# Patient Record
Sex: Female | Born: 1935 | Race: Black or African American | Hispanic: No | Marital: Single | Smoking: Former smoker
Health system: Southern US, Community
[De-identification: ages and names within clinical notes are randomized; demographics above are authoritative.]

## PROBLEM LIST (undated history)

## (undated) DIAGNOSIS — I10 Essential (primary) hypertension: Secondary | ICD-10-CM

## (undated) DIAGNOSIS — IMO0001 Reserved for inherently not codable concepts without codable children: Secondary | ICD-10-CM

## (undated) DIAGNOSIS — T7840XA Allergy, unspecified, initial encounter: Secondary | ICD-10-CM

## (undated) DIAGNOSIS — K219 Gastro-esophageal reflux disease without esophagitis: Secondary | ICD-10-CM

## (undated) DIAGNOSIS — K224 Dyskinesia of esophagus: Secondary | ICD-10-CM

## (undated) DIAGNOSIS — N281 Cyst of kidney, acquired: Secondary | ICD-10-CM

## (undated) DIAGNOSIS — K279 Peptic ulcer, site unspecified, unspecified as acute or chronic, without hemorrhage or perforation: Secondary | ICD-10-CM

## (undated) DIAGNOSIS — F329 Major depressive disorder, single episode, unspecified: Secondary | ICD-10-CM

## (undated) DIAGNOSIS — K5790 Diverticulosis of intestine, part unspecified, without perforation or abscess without bleeding: Secondary | ICD-10-CM

## (undated) DIAGNOSIS — N393 Stress incontinence (female) (male): Secondary | ICD-10-CM

## (undated) DIAGNOSIS — M1990 Unspecified osteoarthritis, unspecified site: Secondary | ICD-10-CM

## (undated) DIAGNOSIS — K5730 Diverticulosis of large intestine without perforation or abscess without bleeding: Secondary | ICD-10-CM

## (undated) DIAGNOSIS — F32A Depression, unspecified: Secondary | ICD-10-CM

## (undated) DIAGNOSIS — A048 Other specified bacterial intestinal infections: Secondary | ICD-10-CM

## (undated) DIAGNOSIS — Z5189 Encounter for other specified aftercare: Secondary | ICD-10-CM

## (undated) DIAGNOSIS — J479 Bronchiectasis, uncomplicated: Secondary | ICD-10-CM

## (undated) DIAGNOSIS — K222 Esophageal obstruction: Secondary | ICD-10-CM

## (undated) DIAGNOSIS — D649 Anemia, unspecified: Secondary | ICD-10-CM

## (undated) DIAGNOSIS — K449 Diaphragmatic hernia without obstruction or gangrene: Secondary | ICD-10-CM

## (undated) DIAGNOSIS — E119 Type 2 diabetes mellitus without complications: Secondary | ICD-10-CM

## (undated) DIAGNOSIS — F419 Anxiety disorder, unspecified: Secondary | ICD-10-CM

## (undated) HISTORY — PX: OTHER SURGICAL HISTORY: SHX169

## (undated) HISTORY — DX: Peptic ulcer, site unspecified, unspecified as acute or chronic, without hemorrhage or perforation: K27.9

## (undated) HISTORY — DX: Depression, unspecified: F32.A

## (undated) HISTORY — DX: Esophageal obstruction: K22.2

## (undated) HISTORY — DX: Other specified bacterial intestinal infections: A04.8

## (undated) HISTORY — PX: ROTATOR CUFF REPAIR: SHX139

## (undated) HISTORY — DX: Cyst of kidney, acquired: N28.1

## (undated) HISTORY — DX: Diverticulosis of large intestine without perforation or abscess without bleeding: K57.30

## (undated) HISTORY — DX: Unspecified osteoarthritis, unspecified site: M19.90

## (undated) HISTORY — PX: APPENDECTOMY: SHX54

## (undated) HISTORY — PX: OOPHORECTOMY: SHX86

## (undated) HISTORY — DX: Dyskinesia of esophagus: K22.4

## (undated) HISTORY — DX: Anxiety disorder, unspecified: F41.9

## (undated) HISTORY — DX: Diaphragmatic hernia without obstruction or gangrene: K44.9

## (undated) HISTORY — PX: ABDOMINAL HYSTERECTOMY: SHX81

## (undated) HISTORY — PX: COLONOSCOPY: SHX174

## (undated) HISTORY — DX: Diverticulosis of intestine, part unspecified, without perforation or abscess without bleeding: K57.90

## (undated) HISTORY — DX: Encounter for other specified aftercare: Z51.89

## (undated) HISTORY — PX: CATARACT EXTRACTION, BILATERAL: SHX1313

## (undated) HISTORY — DX: Anemia, unspecified: D64.9

## (undated) HISTORY — DX: Essential (primary) hypertension: I10

## (undated) HISTORY — DX: Bronchiectasis, uncomplicated: J47.9

## (undated) HISTORY — DX: Allergy, unspecified, initial encounter: T78.40XA

## (undated) HISTORY — DX: Reserved for inherently not codable concepts without codable children: IMO0001

## (undated) HISTORY — DX: Major depressive disorder, single episode, unspecified: F32.9

## (undated) HISTORY — DX: Gastro-esophageal reflux disease without esophagitis: K21.9

## (undated) HISTORY — DX: Type 2 diabetes mellitus without complications: E11.9

## (undated) HISTORY — PX: UPPER GASTROINTESTINAL ENDOSCOPY: SHX188

## (undated) SURGERY — Surgical Case
Anesthesia: *Unknown

---

## 1998-06-14 ENCOUNTER — Emergency Department (HOSPITAL_COMMUNITY): Admission: EM | Admit: 1998-06-14 | Discharge: 1998-06-14 | Payer: Self-pay | Admitting: Emergency Medicine

## 1999-01-25 ENCOUNTER — Ambulatory Visit (HOSPITAL_COMMUNITY): Admission: RE | Admit: 1999-01-25 | Discharge: 1999-01-25 | Payer: Self-pay | Admitting: Internal Medicine

## 1999-10-29 ENCOUNTER — Encounter: Admission: RE | Admit: 1999-10-29 | Discharge: 1999-10-29 | Payer: Self-pay | Admitting: Internal Medicine

## 1999-10-29 ENCOUNTER — Encounter: Payer: Self-pay | Admitting: Internal Medicine

## 2000-11-07 ENCOUNTER — Encounter: Admission: RE | Admit: 2000-11-07 | Discharge: 2000-11-07 | Payer: Self-pay | Admitting: Internal Medicine

## 2000-11-07 ENCOUNTER — Encounter: Payer: Self-pay | Admitting: Internal Medicine

## 2001-04-14 ENCOUNTER — Ambulatory Visit (HOSPITAL_COMMUNITY): Admission: RE | Admit: 2001-04-14 | Discharge: 2001-04-14 | Payer: Self-pay | Admitting: Pulmonary Disease

## 2001-04-14 ENCOUNTER — Encounter: Payer: Self-pay | Admitting: Pulmonary Disease

## 2001-04-21 ENCOUNTER — Ambulatory Visit: Admission: RE | Admit: 2001-04-21 | Discharge: 2001-04-21 | Payer: Self-pay | Admitting: Pulmonary Disease

## 2001-12-01 ENCOUNTER — Encounter: Payer: Self-pay | Admitting: Internal Medicine

## 2001-12-01 ENCOUNTER — Encounter: Admission: RE | Admit: 2001-12-01 | Discharge: 2001-12-01 | Payer: Self-pay | Admitting: Internal Medicine

## 2001-12-02 ENCOUNTER — Ambulatory Visit (HOSPITAL_COMMUNITY): Admission: RE | Admit: 2001-12-02 | Discharge: 2001-12-02 | Payer: Self-pay | Admitting: Internal Medicine

## 2003-01-04 ENCOUNTER — Ambulatory Visit (HOSPITAL_COMMUNITY): Admission: RE | Admit: 2003-01-04 | Discharge: 2003-01-04 | Payer: Self-pay | Admitting: Internal Medicine

## 2003-01-04 ENCOUNTER — Encounter: Payer: Self-pay | Admitting: Internal Medicine

## 2004-06-29 ENCOUNTER — Ambulatory Visit: Payer: Self-pay | Admitting: Internal Medicine

## 2004-08-29 ENCOUNTER — Ambulatory Visit: Payer: Self-pay | Admitting: Internal Medicine

## 2004-10-13 ENCOUNTER — Emergency Department (HOSPITAL_COMMUNITY): Admission: EM | Admit: 2004-10-13 | Discharge: 2004-10-13 | Payer: Self-pay | Admitting: Emergency Medicine

## 2004-11-01 ENCOUNTER — Ambulatory Visit: Payer: Self-pay | Admitting: Internal Medicine

## 2005-01-04 ENCOUNTER — Ambulatory Visit: Payer: Self-pay | Admitting: Internal Medicine

## 2005-04-05 ENCOUNTER — Ambulatory Visit: Payer: Self-pay | Admitting: Internal Medicine

## 2005-06-28 ENCOUNTER — Ambulatory Visit: Payer: Self-pay | Admitting: Internal Medicine

## 2005-07-18 ENCOUNTER — Ambulatory Visit: Payer: Self-pay | Admitting: Internal Medicine

## 2005-09-05 ENCOUNTER — Ambulatory Visit: Payer: Self-pay | Admitting: Internal Medicine

## 2005-10-17 ENCOUNTER — Ambulatory Visit: Payer: Self-pay | Admitting: Internal Medicine

## 2005-11-11 ENCOUNTER — Emergency Department (HOSPITAL_COMMUNITY): Admission: EM | Admit: 2005-11-11 | Discharge: 2005-11-11 | Payer: Self-pay | Admitting: Emergency Medicine

## 2005-11-11 ENCOUNTER — Ambulatory Visit: Payer: Self-pay | Admitting: Internal Medicine

## 2005-11-12 ENCOUNTER — Ambulatory Visit: Payer: Self-pay | Admitting: Internal Medicine

## 2005-11-21 ENCOUNTER — Ambulatory Visit: Payer: Self-pay | Admitting: Internal Medicine

## 2005-11-25 ENCOUNTER — Ambulatory Visit: Payer: Self-pay | Admitting: Internal Medicine

## 2005-12-17 ENCOUNTER — Ambulatory Visit (HOSPITAL_COMMUNITY): Admission: RE | Admit: 2005-12-17 | Discharge: 2005-12-17 | Payer: Self-pay | Admitting: Ophthalmology

## 2005-12-24 ENCOUNTER — Ambulatory Visit: Payer: Self-pay | Admitting: Internal Medicine

## 2005-12-26 ENCOUNTER — Ambulatory Visit (HOSPITAL_COMMUNITY): Admission: RE | Admit: 2005-12-26 | Discharge: 2005-12-26 | Payer: Self-pay | Admitting: Ophthalmology

## 2005-12-31 ENCOUNTER — Ambulatory Visit (HOSPITAL_COMMUNITY): Admission: RE | Admit: 2005-12-31 | Discharge: 2005-12-31 | Payer: Self-pay | Admitting: Ophthalmology

## 2006-01-06 ENCOUNTER — Ambulatory Visit: Payer: Self-pay | Admitting: Internal Medicine

## 2006-03-18 ENCOUNTER — Ambulatory Visit: Payer: Self-pay | Admitting: Internal Medicine

## 2006-05-20 ENCOUNTER — Ambulatory Visit: Payer: Self-pay | Admitting: Internal Medicine

## 2006-06-12 ENCOUNTER — Ambulatory Visit: Payer: Self-pay | Admitting: Cardiology

## 2006-08-19 ENCOUNTER — Ambulatory Visit: Payer: Self-pay | Admitting: Internal Medicine

## 2006-09-05 ENCOUNTER — Ambulatory Visit: Payer: Self-pay | Admitting: Internal Medicine

## 2006-09-08 ENCOUNTER — Ambulatory Visit (HOSPITAL_COMMUNITY): Admission: RE | Admit: 2006-09-08 | Discharge: 2006-09-08 | Payer: Self-pay | Admitting: Internal Medicine

## 2006-10-09 ENCOUNTER — Ambulatory Visit: Payer: Self-pay | Admitting: Internal Medicine

## 2006-11-05 ENCOUNTER — Ambulatory Visit: Payer: Self-pay | Admitting: Internal Medicine

## 2006-11-11 ENCOUNTER — Ambulatory Visit: Payer: Self-pay | Admitting: Internal Medicine

## 2006-11-11 LAB — HM COLONOSCOPY

## 2006-12-17 DIAGNOSIS — K222 Esophageal obstruction: Secondary | ICD-10-CM

## 2006-12-17 DIAGNOSIS — I10 Essential (primary) hypertension: Secondary | ICD-10-CM | POA: Insufficient documentation

## 2006-12-17 DIAGNOSIS — K219 Gastro-esophageal reflux disease without esophagitis: Secondary | ICD-10-CM | POA: Insufficient documentation

## 2007-03-12 ENCOUNTER — Ambulatory Visit: Payer: Self-pay | Admitting: Internal Medicine

## 2007-03-12 DIAGNOSIS — R05 Cough: Secondary | ICD-10-CM | POA: Insufficient documentation

## 2007-04-23 ENCOUNTER — Telehealth (INDEPENDENT_AMBULATORY_CARE_PROVIDER_SITE_OTHER): Payer: Self-pay | Admitting: *Deleted

## 2007-05-05 ENCOUNTER — Ambulatory Visit: Payer: Self-pay | Admitting: Internal Medicine

## 2007-05-05 DIAGNOSIS — R7309 Other abnormal glucose: Secondary | ICD-10-CM

## 2007-05-05 LAB — CONVERTED CEMR LAB
BUN: 19 mg/dL (ref 6–23)
Basophils Absolute: 0.1 10*3/uL (ref 0.0–0.1)
CO2: 27 meq/L (ref 19–32)
Chloride: 102 meq/L (ref 96–112)
Eosinophils Absolute: 0.4 10*3/uL (ref 0.0–0.6)
GFR calc non Af Amer: 39 mL/min
Glucose, Bld: 99 mg/dL (ref 70–99)
HCT: 34.3 % — ABNORMAL LOW (ref 36.0–46.0)
Hgb A1c MFr Bld: 6 % (ref 4.6–6.0)
Lymphocytes Relative: 26.1 % (ref 12.0–46.0)
MCV: 89.7 fL (ref 78.0–100.0)
Monocytes Absolute: 0.4 10*3/uL (ref 0.2–0.7)
Neutro Abs: 4.3 10*3/uL (ref 1.4–7.7)
Neutrophils Relative %: 61.8 % (ref 43.0–77.0)
Potassium: 5 meq/L (ref 3.5–5.1)
RDW: 13.1 % (ref 11.5–14.6)
Sodium: 140 meq/L (ref 135–145)

## 2007-06-11 ENCOUNTER — Ambulatory Visit: Payer: Self-pay | Admitting: Internal Medicine

## 2007-09-10 ENCOUNTER — Ambulatory Visit: Payer: Self-pay | Admitting: Internal Medicine

## 2007-09-10 DIAGNOSIS — K5909 Other constipation: Secondary | ICD-10-CM

## 2007-09-24 ENCOUNTER — Telehealth: Payer: Self-pay | Admitting: *Deleted

## 2007-11-19 ENCOUNTER — Telehealth: Payer: Self-pay | Admitting: Internal Medicine

## 2007-11-20 ENCOUNTER — Telehealth: Payer: Self-pay | Admitting: Internal Medicine

## 2007-12-08 ENCOUNTER — Ambulatory Visit: Payer: Self-pay | Admitting: Internal Medicine

## 2007-12-17 ENCOUNTER — Telehealth: Payer: Self-pay | Admitting: Internal Medicine

## 2008-01-05 ENCOUNTER — Telehealth: Payer: Self-pay | Admitting: Internal Medicine

## 2008-01-07 ENCOUNTER — Ambulatory Visit: Payer: Self-pay | Admitting: Internal Medicine

## 2008-01-07 ENCOUNTER — Telehealth: Payer: Self-pay | Admitting: Internal Medicine

## 2008-01-07 DIAGNOSIS — K224 Dyskinesia of esophagus: Secondary | ICD-10-CM

## 2008-01-11 ENCOUNTER — Ambulatory Visit (HOSPITAL_COMMUNITY): Admission: RE | Admit: 2008-01-11 | Discharge: 2008-01-11 | Payer: Self-pay | Admitting: Internal Medicine

## 2008-01-13 ENCOUNTER — Encounter: Payer: Self-pay | Admitting: Internal Medicine

## 2008-02-03 ENCOUNTER — Ambulatory Visit: Payer: Self-pay | Admitting: Internal Medicine

## 2008-02-03 DIAGNOSIS — M545 Low back pain: Secondary | ICD-10-CM

## 2008-02-12 ENCOUNTER — Telehealth: Payer: Self-pay | Admitting: *Deleted

## 2008-03-10 DIAGNOSIS — K5730 Diverticulosis of large intestine without perforation or abscess without bleeding: Secondary | ICD-10-CM

## 2008-03-10 DIAGNOSIS — K449 Diaphragmatic hernia without obstruction or gangrene: Secondary | ICD-10-CM | POA: Insufficient documentation

## 2008-03-10 DIAGNOSIS — K279 Peptic ulcer, site unspecified, unspecified as acute or chronic, without hemorrhage or perforation: Secondary | ICD-10-CM | POA: Insufficient documentation

## 2008-03-10 DIAGNOSIS — Z8719 Personal history of other diseases of the digestive system: Secondary | ICD-10-CM

## 2008-03-10 HISTORY — DX: Diverticulosis of large intestine without perforation or abscess without bleeding: K57.30

## 2008-03-14 ENCOUNTER — Ambulatory Visit: Payer: Self-pay | Admitting: Internal Medicine

## 2008-04-06 ENCOUNTER — Telehealth: Payer: Self-pay | Admitting: Internal Medicine

## 2008-04-29 ENCOUNTER — Telehealth: Payer: Self-pay | Admitting: Internal Medicine

## 2008-05-09 ENCOUNTER — Telehealth: Payer: Self-pay | Admitting: Internal Medicine

## 2008-05-13 ENCOUNTER — Ambulatory Visit: Payer: Self-pay | Admitting: Internal Medicine

## 2008-05-13 DIAGNOSIS — M5430 Sciatica, unspecified side: Secondary | ICD-10-CM

## 2008-05-20 ENCOUNTER — Encounter: Admission: RE | Admit: 2008-05-20 | Discharge: 2008-05-20 | Payer: Self-pay | Admitting: Internal Medicine

## 2008-06-03 ENCOUNTER — Ambulatory Visit: Payer: Self-pay | Admitting: Internal Medicine

## 2008-06-29 DIAGNOSIS — G4700 Insomnia, unspecified: Secondary | ICD-10-CM

## 2008-08-18 ENCOUNTER — Encounter: Payer: Self-pay | Admitting: Internal Medicine

## 2008-08-30 ENCOUNTER — Ambulatory Visit (HOSPITAL_COMMUNITY): Admission: RE | Admit: 2008-08-30 | Discharge: 2008-08-31 | Payer: Self-pay | Admitting: Orthopedic Surgery

## 2008-08-30 ENCOUNTER — Encounter: Admission: RE | Admit: 2008-08-30 | Discharge: 2008-08-30 | Payer: Self-pay | Admitting: Orthopedic Surgery

## 2008-08-30 ENCOUNTER — Telehealth: Payer: Self-pay | Admitting: Internal Medicine

## 2008-08-31 ENCOUNTER — Telehealth: Payer: Self-pay | Admitting: Internal Medicine

## 2008-09-13 ENCOUNTER — Encounter (HOSPITAL_COMMUNITY): Admission: RE | Admit: 2008-09-13 | Discharge: 2008-09-14 | Payer: Self-pay | Admitting: Orthopedic Surgery

## 2008-09-16 ENCOUNTER — Ambulatory Visit: Payer: Self-pay | Admitting: Internal Medicine

## 2008-09-16 DIAGNOSIS — D500 Iron deficiency anemia secondary to blood loss (chronic): Secondary | ICD-10-CM

## 2008-09-19 ENCOUNTER — Telehealth: Payer: Self-pay | Admitting: Internal Medicine

## 2008-09-19 ENCOUNTER — Ambulatory Visit: Payer: Self-pay | Admitting: Internal Medicine

## 2008-09-19 DIAGNOSIS — D649 Anemia, unspecified: Secondary | ICD-10-CM

## 2008-09-19 DIAGNOSIS — Z8711 Personal history of peptic ulcer disease: Secondary | ICD-10-CM

## 2008-09-19 LAB — CONVERTED CEMR LAB
Eosinophils Relative: 3.4 % (ref 0.0–5.0)
Lymphs Abs: 1.4 10*3/uL (ref 0.7–4.0)
RBC: 3.12 M/uL — ABNORMAL LOW (ref 3.87–5.11)
Saturation Ratios: 6.1 % — ABNORMAL LOW (ref 20.0–50.0)
Vitamin B-12: 427 pg/mL (ref 211–911)
WBC: 9.7 10*3/uL (ref 4.5–10.5)

## 2008-09-20 ENCOUNTER — Ambulatory Visit: Payer: Self-pay | Admitting: Internal Medicine

## 2008-09-20 LAB — CONVERTED CEMR LAB
Basophils Absolute: 0.1 10*3/uL (ref 0.0–0.1)
Basophils Relative: 0.9 % (ref 0.0–3.0)
Eosinophils Absolute: 0.5 10*3/uL (ref 0.0–0.7)
Eosinophils Relative: 6.2 % — ABNORMAL HIGH (ref 0.0–5.0)
Ferritin: 181.5 ng/mL (ref 10.0–291.0)
HCT: 26.1 % — ABNORMAL LOW (ref 36.0–46.0)
Hemoglobin: 8.7 g/dL — ABNORMAL LOW (ref 12.0–15.0)
Lymphs Abs: 1.7 10*3/uL (ref 0.7–4.0)
MCV: 85.6 fL (ref 78.0–100.0)
Monocytes Relative: 5.4 % (ref 3.0–12.0)
Platelets: 575 10*3/uL — ABNORMAL HIGH (ref 150.0–400.0)
RBC: 3.05 M/uL — ABNORMAL LOW (ref 3.87–5.11)
RDW: 14.6 % (ref 11.5–14.6)

## 2008-09-22 ENCOUNTER — Encounter: Payer: Self-pay | Admitting: *Deleted

## 2008-09-22 ENCOUNTER — Telehealth: Payer: Self-pay | Admitting: Internal Medicine

## 2008-09-26 ENCOUNTER — Encounter: Payer: Self-pay | Admitting: Internal Medicine

## 2008-10-14 ENCOUNTER — Ambulatory Visit: Payer: Self-pay | Admitting: Internal Medicine

## 2008-10-14 DIAGNOSIS — M19019 Primary osteoarthritis, unspecified shoulder: Secondary | ICD-10-CM | POA: Insufficient documentation

## 2008-10-31 ENCOUNTER — Encounter: Payer: Self-pay | Admitting: Internal Medicine

## 2008-11-10 ENCOUNTER — Ambulatory Visit (HOSPITAL_COMMUNITY): Admission: RE | Admit: 2008-11-10 | Discharge: 2008-11-10 | Payer: Self-pay | Admitting: Ophthalmology

## 2008-12-09 ENCOUNTER — Ambulatory Visit: Payer: Self-pay | Admitting: Internal Medicine

## 2008-12-16 ENCOUNTER — Encounter: Admission: RE | Admit: 2008-12-16 | Discharge: 2008-12-16 | Payer: Self-pay | Admitting: Internal Medicine

## 2008-12-29 ENCOUNTER — Encounter: Payer: Self-pay | Admitting: Internal Medicine

## 2009-01-27 ENCOUNTER — Ambulatory Visit: Payer: Self-pay | Admitting: Internal Medicine

## 2009-01-27 LAB — CONVERTED CEMR LAB
Basophils Absolute: 0 10*3/uL (ref 0.0–0.1)
Eosinophils Absolute: 0.3 10*3/uL (ref 0.0–0.7)
Lymphocytes Relative: 33.4 % (ref 12.0–46.0)
Lymphs Abs: 1.9 10*3/uL (ref 0.7–4.0)
MCV: 89.3 fL (ref 78.0–100.0)
Monocytes Absolute: 0.3 10*3/uL (ref 0.1–1.0)
Monocytes Relative: 4.6 % (ref 3.0–12.0)
Neutro Abs: 3.2 10*3/uL (ref 1.4–7.7)
Platelets: 327 10*3/uL (ref 150.0–400.0)
RBC: 3.86 M/uL — ABNORMAL LOW (ref 3.87–5.11)
WBC: 5.7 10*3/uL (ref 4.5–10.5)

## 2009-02-10 ENCOUNTER — Ambulatory Visit: Payer: Self-pay | Admitting: Internal Medicine

## 2009-02-28 ENCOUNTER — Telehealth: Payer: Self-pay | Admitting: Internal Medicine

## 2009-03-20 ENCOUNTER — Telehealth (INDEPENDENT_AMBULATORY_CARE_PROVIDER_SITE_OTHER): Payer: Self-pay | Admitting: *Deleted

## 2009-04-21 ENCOUNTER — Ambulatory Visit (HOSPITAL_COMMUNITY): Admission: RE | Admit: 2009-04-21 | Discharge: 2009-04-21 | Payer: Self-pay | Admitting: Ophthalmology

## 2009-04-21 ENCOUNTER — Ambulatory Visit: Payer: Self-pay | Admitting: Internal Medicine

## 2009-04-21 DIAGNOSIS — R351 Nocturia: Secondary | ICD-10-CM | POA: Insufficient documentation

## 2009-04-28 ENCOUNTER — Encounter: Admission: RE | Admit: 2009-04-28 | Discharge: 2009-04-28 | Payer: Self-pay | Admitting: Internal Medicine

## 2009-05-08 ENCOUNTER — Telehealth: Payer: Self-pay | Admitting: Internal Medicine

## 2009-05-30 ENCOUNTER — Telehealth: Payer: Self-pay | Admitting: Internal Medicine

## 2009-07-21 ENCOUNTER — Ambulatory Visit: Payer: Self-pay | Admitting: Internal Medicine

## 2009-07-21 ENCOUNTER — Telehealth: Payer: Self-pay | Admitting: Internal Medicine

## 2009-07-21 DIAGNOSIS — R358 Other polyuria: Secondary | ICD-10-CM

## 2009-07-21 LAB — CONVERTED CEMR LAB
Basophils Absolute: 0 10*3/uL (ref 0.0–0.1)
Basophils Relative: 0.3 % (ref 0.0–3.0)
Calcium: 10 mg/dL (ref 8.4–10.5)
Creatinine, Ser: 1.1 mg/dL (ref 0.4–1.2)
Eosinophils Relative: 4.8 % (ref 0.0–5.0)
GFR calc non Af Amer: 62.07 mL/min (ref >60)
HCT: 34.5 % — ABNORMAL LOW (ref 36.0–46.0)
Hemoglobin: 11.3 g/dL — ABNORMAL LOW (ref 12.0–15.0)
Iron: 69 ug/dL (ref 42–145)
MCV: 92.5 fL (ref 78.0–100.0)
Monocytes Absolute: 0.4 10*3/uL (ref 0.1–1.0)
Neutro Abs: 2.6 10*3/uL (ref 1.4–7.7)
Neutrophils Relative %: 48.6 % (ref 43.0–77.0)
Potassium: 3.7 meq/L (ref 3.5–5.1)
Saturation Ratios: 22.1 % (ref 20.0–50.0)
Transferrin: 223 mg/dL (ref 212.0–360.0)
WBC: 5.4 10*3/uL (ref 4.5–10.5)

## 2009-08-01 ENCOUNTER — Encounter: Payer: Self-pay | Admitting: Internal Medicine

## 2009-08-10 ENCOUNTER — Telehealth: Payer: Self-pay | Admitting: Internal Medicine

## 2009-10-20 ENCOUNTER — Ambulatory Visit: Payer: Self-pay | Admitting: Internal Medicine

## 2009-10-26 ENCOUNTER — Encounter: Admission: RE | Admit: 2009-10-26 | Discharge: 2009-10-26 | Payer: Self-pay | Admitting: Orthopedic Surgery

## 2009-10-27 ENCOUNTER — Ambulatory Visit (HOSPITAL_BASED_OUTPATIENT_CLINIC_OR_DEPARTMENT_OTHER): Admission: RE | Admit: 2009-10-27 | Discharge: 2009-10-27 | Payer: Self-pay | Admitting: Orthopedic Surgery

## 2009-11-28 ENCOUNTER — Telehealth: Payer: Self-pay | Admitting: Internal Medicine

## 2009-12-26 ENCOUNTER — Telehealth: Payer: Self-pay | Admitting: Internal Medicine

## 2010-01-08 ENCOUNTER — Encounter: Payer: Self-pay | Admitting: Internal Medicine

## 2010-01-25 ENCOUNTER — Ambulatory Visit: Payer: Self-pay | Admitting: Internal Medicine

## 2010-01-31 ENCOUNTER — Telehealth: Payer: Self-pay | Admitting: Internal Medicine

## 2010-01-31 ENCOUNTER — Ambulatory Visit: Payer: Self-pay | Admitting: Internal Medicine

## 2010-02-01 LAB — CONVERTED CEMR LAB
Basophils Absolute: 0 10*3/uL (ref 0.0–0.1)
Basophils Relative: 0.4 % (ref 0.0–3.0)
Hemoglobin: 12.5 g/dL (ref 12.0–15.0)
MCHC: 33.2 g/dL (ref 30.0–36.0)
Monocytes Absolute: 0.4 10*3/uL (ref 0.1–1.0)
Monocytes Relative: 5.9 % (ref 3.0–12.0)
Neutro Abs: 5 10*3/uL (ref 1.4–7.7)
Neutrophils Relative %: 67.3 % (ref 43.0–77.0)
Platelets: 382 10*3/uL (ref 150.0–400.0)
RDW: 15 % — ABNORMAL HIGH (ref 11.5–14.6)
TSH: 1.22 microintl units/mL (ref 0.35–5.50)
WBC: 7.5 10*3/uL (ref 4.5–10.5)

## 2010-02-20 ENCOUNTER — Telehealth: Payer: Self-pay | Admitting: Internal Medicine

## 2010-02-21 ENCOUNTER — Ambulatory Visit: Payer: Self-pay | Admitting: Gastroenterology

## 2010-02-21 ENCOUNTER — Encounter: Payer: Self-pay | Admitting: Physician Assistant

## 2010-02-21 DIAGNOSIS — K259 Gastric ulcer, unspecified as acute or chronic, without hemorrhage or perforation: Secondary | ICD-10-CM | POA: Insufficient documentation

## 2010-02-21 DIAGNOSIS — R112 Nausea with vomiting, unspecified: Secondary | ICD-10-CM

## 2010-02-21 DIAGNOSIS — M1990 Unspecified osteoarthritis, unspecified site: Secondary | ICD-10-CM | POA: Insufficient documentation

## 2010-02-21 DIAGNOSIS — R110 Nausea: Secondary | ICD-10-CM

## 2010-02-21 DIAGNOSIS — K5900 Constipation, unspecified: Secondary | ICD-10-CM | POA: Insufficient documentation

## 2010-02-21 DIAGNOSIS — R1013 Epigastric pain: Secondary | ICD-10-CM

## 2010-02-21 LAB — CONVERTED CEMR LAB
Basophils Absolute: 0 10*3/uL (ref 0.0–0.1)
CO2: 24 meq/L (ref 19–32)
Eosinophils Relative: 1.4 % (ref 0.0–5.0)
Glucose, Bld: 109 mg/dL — ABNORMAL HIGH (ref 70–99)
Hemoglobin: 11.3 g/dL — ABNORMAL LOW (ref 12.0–15.0)
Lipase: 28 units/L (ref 11.0–59.0)
Lymphocytes Relative: 16.7 % (ref 12.0–46.0)
MCHC: 33.9 g/dL (ref 30.0–36.0)
MCV: 90.9 fL (ref 78.0–100.0)
Monocytes Relative: 4.5 % (ref 3.0–12.0)
Neutro Abs: 7.2 10*3/uL (ref 1.4–7.7)
Neutrophils Relative %: 77.1 % — ABNORMAL HIGH (ref 43.0–77.0)
RBC: 3.65 M/uL — ABNORMAL LOW (ref 3.87–5.11)
Sodium: 140 meq/L (ref 135–145)
Total Bilirubin: 0.4 mg/dL (ref 0.3–1.2)

## 2010-02-23 ENCOUNTER — Ambulatory Visit (HOSPITAL_COMMUNITY): Admission: RE | Admit: 2010-02-23 | Discharge: 2010-02-23 | Payer: Self-pay | Admitting: Gastroenterology

## 2010-02-27 ENCOUNTER — Ambulatory Visit: Payer: Self-pay | Admitting: Physician Assistant

## 2010-03-01 DIAGNOSIS — N281 Cyst of kidney, acquired: Secondary | ICD-10-CM

## 2010-03-01 HISTORY — DX: Cyst of kidney, acquired: N28.1

## 2010-03-05 LAB — CONVERTED CEMR LAB
CO2: 23 meq/L (ref 19–32)
Potassium: 4.6 meq/L (ref 3.5–5.1)
Sodium: 143 meq/L (ref 135–145)

## 2010-03-06 ENCOUNTER — Ambulatory Visit: Payer: Self-pay | Admitting: Internal Medicine

## 2010-03-06 LAB — CONVERTED CEMR LAB
Calcium: 10.5 mg/dL (ref 8.4–10.5)
Chloride: 107 meq/L (ref 96–112)
Glucose, Bld: 94 mg/dL (ref 70–99)
Potassium: 4.9 meq/L (ref 3.5–5.1)
Sodium: 144 meq/L (ref 135–145)

## 2010-03-23 ENCOUNTER — Ambulatory Visit: Payer: Self-pay | Admitting: Internal Medicine

## 2010-03-27 ENCOUNTER — Telehealth: Payer: Self-pay | Admitting: Internal Medicine

## 2010-04-27 ENCOUNTER — Ambulatory Visit: Payer: Self-pay | Admitting: Internal Medicine

## 2010-04-27 LAB — CONVERTED CEMR LAB
Calcium: 9.8 mg/dL (ref 8.4–10.5)
Creatinine, Ser: 1.1 mg/dL (ref 0.4–1.2)
Potassium: 4.4 meq/L (ref 3.5–5.1)

## 2010-05-04 ENCOUNTER — Ambulatory Visit: Payer: Self-pay | Admitting: Internal Medicine

## 2010-06-04 ENCOUNTER — Ambulatory Visit: Payer: Self-pay | Admitting: Internal Medicine

## 2010-06-11 ENCOUNTER — Encounter: Payer: Self-pay | Admitting: Internal Medicine

## 2010-06-21 ENCOUNTER — Ambulatory Visit
Admission: RE | Admit: 2010-06-21 | Discharge: 2010-06-21 | Payer: Self-pay | Source: Home / Self Care | Attending: Internal Medicine | Admitting: Internal Medicine

## 2010-06-22 LAB — CONVERTED CEMR LAB
Fecal Occult Blood: NEGATIVE
OCCULT 1: NEGATIVE
OCCULT 2: NEGATIVE
OCCULT 5: NEGATIVE

## 2010-06-30 ENCOUNTER — Encounter: Payer: Self-pay | Admitting: Internal Medicine

## 2010-07-06 ENCOUNTER — Telehealth: Payer: Self-pay | Admitting: Internal Medicine

## 2010-07-24 NOTE — Assessment & Plan Note (Signed)
Summary: 3 mo rov/mm/pt rescd from [facility]//ccm   Vital Signs:  Patient profile:   77 year old female Height:      66 inches Weight:      196 pounds BMI:     31.75 Temp:     98.6 degrees F oral Pulse rate:   76 / minute BP sitting:   130 / 72  (left arm) Cuff size:   regular  Vitals Entered By: Kathrynn Speed CMA (January 25, 2010 1:20 PM)  Nutrition Counseling: Patient's BMI is greater than 25 and therefore counseled on weight management options. CC: 3 mth rov, src, Hypertension Management   Primary Care Provider:  Peri Jefferson  CC:  3 mth rov, src, and Hypertension Management.  History of Present Illness: The pt has been doing well the pt feels constipated and has asked for a colon cleansing she feels that the increased head aches and muslce aches are related to her constipation  Hypertension History:      She denies headache, chest pain, palpitations, dyspnea with exertion, orthopnea, PND, peripheral edema, visual symptoms, neurologic problems, syncope, and side effects from treatment.        Positive major cardiovascular risk factors include female age 74 years old or older and hypertension.  Negative major cardiovascular risk factors include non-tobacco-user status.     Problems Prior to Update: 1)  Polydipsia  (ICD-783.5) 2)  Nocturia  (ICD-788.43) 3)  Loc Osteoarthros Not Spec Prim/sec [place] Region  (ICD-715.31) 4)  Anemia  (ICD-285.9) 5)  Hx of Personal History of Peptic Ulcer Disease  (ICD-V12.71) 6)  Iron 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  (ICD-280.0) 7)  Insomnia  (ICD-780.52) 8)  Sciatica, Right  (ICD-724.3) 9)  Pud  (ICD-533.90) 10)  Esophagitis, Hx of  (ICD-V12.79) 11)  Hiatal Hernia  (ICD-553.3) 12)  Diverticulosis of Colon  (ICD-562.10) 13)  Low Back Pain  (ICD-724.2) 14)  Esophageal Motility Disorder  (ICD-530.5) 15)  Constipation, Chronic  (ICD-564.09) 16)  Hyperglycemia, Borderline  (ICD-790.29) 17)  Cough, Chronic  (ICD-786.2) 18)   Esophageal Stricture  (ICD-530.3) 19)  Hypertension  (ICD-401.9) 20)  Gerd  (ICD-530.81)  Medications Prior to Update: 1)  Calan Sr 240 Mg  Tbcr (Verapamil Hcl) .... Once Daily 2)  Daily Multi-Vitamins/iron   Tabs (Multiple Vitamins-Iron) .... Once Daily 3)  Benicar Hct 40-25 Mg  Tabs (Olmesartan Medoxomil-Hctz) .... Once Daily 4)  Hydrocodone-Acetaminophen 5-325 Mg Tabs (Hydrocodone-Acetaminophen) .... One By Mouth Three Times A Day As Needed 5)  Desoximetasone 0.25 %  Crea (Desoximetasone) .... Apply To Rash Prn 6)  Amitriptyline Hcl 25 Mg  Tabs (Amitriptyline Hcl) .Marland Kitchen.. 1 Once Daily 7)  Hypercare 20 % Soln (Aluminum Chloride) .... Apply Inder Arms Once Daily 8)  Klor-Con M20 20 Meq Cr-Tabs (Potassium Chloride Crys Cr) .... One By Mouth Daily 9)  Nexium 40 Mg Cpdr (Esomeprazole Magnesium) .... One By Mouth Daily 10)  Ferrous Sulfate 324 Mg Tbec (Ferrous Sulfate) .... One Twice A Day With Food 11)  Etodolac 300 Mg Caps (Etodolac) .... One By Mouth Bid 12)  Cymbalta 60 Mg Cpep (Duloxetine Hcl) .... One By Mouth  Each Day 13)  Allegra 180 Mg Tabs (Fexofenadine Hcl) .... Take 1 Tablet By Mouth Once A Day As Needed Allergies 14)  Toviaz 8 Mg Xr24h-Tab (Fesoterodine Fumarate) .... One By Mouth Daily 15)  Tussionex Pennkinetic Er 8-10 Mg/65ml Lqcr (Chlorpheniramine-Hydrocodone) .Marland Kitchen.. 1 Tsp Every 12 Hours As Needed Cough  Current Medications (verified): 1)  Calan Sr  240 Mg  Tbcr (Verapamil Hcl) .... Once Daily 2)  Daily Multi-Vitamins/iron   Tabs (Multiple Vitamins-Iron) .... Once Daily 3)  Benicar Hct 40-25 Mg  Tabs (Olmesartan Medoxomil-Hctz) .... Once Daily 4)  Hydrocodone-Acetaminophen 5-325 Mg Tabs (Hydrocodone-Acetaminophen) .... One By Mouth Three Times A Day As Needed 5)  Desoximetasone 0.25 %  Crea (Desoximetasone) .... Apply To Rash Prn 6)  Amitriptyline Hcl 25 Mg  Tabs (Amitriptyline Hcl) .Marland Kitchen.. 1 Once Daily 7)  Hypercare 20 % Soln (Aluminum Chloride) .... Apply Inder Arms Once Daily 8)   Klor-Con M20 20 Meq Cr-Tabs (Potassium Chloride Crys Cr) .... One By Mouth Daily 9)  Nexium 40 Mg Cpdr (Esomeprazole Magnesium) .... One By Mouth Daily 10)  Ferrous Sulfate 324 Mg Tbec (Ferrous Sulfate) .... One Twice A Day With Food 11)  Etodolac 300 Mg Caps (Etodolac) .... One By Mouth Bid 12)  Cymbalta 60 Mg Cpep (Duloxetine Hcl) .... One By Mouth  Each Day 13)  Allegra 180 Mg Tabs (Fexofenadine Hcl) .... Take 1 Tablet By Mouth Once A Day As Needed Allergies 14)  Toviaz 8 Mg Xr24h-Tab (Fesoterodine Fumarate) .... One By Mouth Daily 15)  Tussionex Pennkinetic Er 8-10 Mg/58ml Lqcr (Chlorpheniramine-Hydrocodone) .Marland Kitchen.. 1 Tsp Every 12 Hours As Needed Cough 16)  Miralax  Powd (Polyethylene Glycol 3350) .Marland KitchenMarland KitchenMarland Kitchen 17 Gms A Day in Water  Allergies (verified): No Known Drug Allergies  Past History:  Family History: Last updated: 03/10/2008 Family History Diabetes 1st degree relative Family History of Heart Disease:   Social History: Last updated: 03/14/2008 Occupation: Conservation officer, nature Former Smoker Alcohol use-no Drug use-no Regular exercise-no  Risk Factors: Exercise: no (03/12/2007)  Risk Factors: Smoking Status: quit (10/20/2009)  Past medical, surgical, family and social histories (including risk factors) reviewed, and no changes noted (except as noted below).  Past Medical History: Reviewed history from 09/19/2008 and no changes required. Menopause Low back pain Current Problems:  PUD (ICD-533.90) ESOPHAGITIS, HX OF (ICD-V12.79) HIATAL HERNIA (ICD-553.3) DIVERTICULOSIS OF COLON (ICD-562.10) LOW BACK PAIN (ICD-724.2) ESOPHAGEAL MOTILITY DISORDER (ICD-530.5) CONSTIPATION, CHRONIC (ICD-564.09) HYPERGLYCEMIA, BORDERLINE (ICD-790.29) COUGH, CHRONIC (ICD-786.2) ESOPHAGEAL STRICTURE (ICD-530.3) HYPERTENSION (ICD-401.9) GERD (ICD-530.81) Gastroparesis  Past Surgical History: Reviewed history from 12/09/2008 and no changes required. Hysterectomy Rotator cuff repair  graves Oophorectomy cataracts on left and right ( [facility])  Family History: Reviewed history from 03/10/2008 and no changes required. Family History Diabetes 1st degree relative Family History of Heart Disease:   Social History: Reviewed history from 03/14/2008 and no changes required. Occupation: Conservation officer, nature Former Smoker Alcohol use-no Drug use-no Regular exercise-no  Review of Systems  The patient denies anorexia, fever, weight loss, weight gain, vision loss, decreased hearing, hoarseness, chest pain, syncope, dyspnea on exertion, peripheral edema, prolonged cough, headaches, hemoptysis, abdominal pain, melena, hematochezia, severe indigestion/heartburn, hematuria, incontinence, genital sores, muscle weakness, suspicious skin lesions, transient blindness, difficulty walking, depression, unusual weight change, abnormal bleeding, enlarged lymph nodes, angioedema, and breast masses.    Physical Exam  General:  Well developed, well nourished, no acute distress. Head:  Normocephalic and atraumatic. Ears:  R ear normal and L ear normal.   Mouth:  No oral lesions. Tongue moist.  Neck:  Supple; no masses. Heart:  normal rate and regular rhythm.   Abdomen:  soft and non-tender.   Msk:  no joint tenderness, no joint swelling, and no joint warmth.   Neurologic:  alert & oriented X3 and gait normal.     Impression & Recommendations:  Problem # 1:  CONSTIPATION, CHRONIC (ICD-564.09)  discussion of  colon cleanse products and risk as well as  miralax Discussed dietary fiber measures and increased water intake.   Her updated medication list for this problem includes:    Miralax Powd (Polyethylene glycol 3350) .Marland KitchenMarland KitchenMarland KitchenMarland Kitchen 17 gms a day in water  Problem # 2:  HYPERTENSION (ICD-401.9)  Her updated medication list for this problem includes:    Calan Sr 240 Mg Tbcr (Verapamil hcl) ..... Once daily    Benicar Hct 40-25 Mg Tabs (Olmesartan medoxomil-hctz) ..... Once daily  BP today:  130/72 Prior BP: 140/80 (10/20/2009)  Prior 10 Yr Risk Heart Disease: Not enough information (05/05/2007)  Labs Reviewed: K+: 3.7 (07/21/2009) Creat: : 1.1 (07/21/2009)     Problem # 3:  COUGH, CHRONIC (ICD-786.2) Assessment: Improved subsided  Problem # 4:  GERD (ICD-530.81)  Her updated medication list for this problem includes:    Nexium 40 Mg Cpdr (Esomeprazole magnesium) ..... One by mouth daily  EGD: Location: [facility]  1) Ring, esophageal in the distal esophagus (non-obstructive) 2) 2 cm hiatal hernia 3) Abnormal mucosa at the gastroesophageal junction (MILD GERD CHANGES) 4) 8 mm ulcer in the antrum (BX BENIGN INFLAMMATION?FIBROSIS) 5) Erosions, multiple in the antrum 6) Otherwise normal examination 7) She reported new vaginal bleeding today (09/20/2008)  Labs Reviewed: Hgb: 11.3 (07/21/2009)   Hct: 34.5 (07/21/2009)  Complete Medication List: 1)  Calan Sr 240 Mg Tbcr (Verapamil hcl) .... Once daily 2)  Daily Multi-vitamins/iron Tabs (Multiple vitamins-iron) .... Once daily 3)  Benicar Hct 40-25 Mg Tabs (Olmesartan medoxomil-hctz) .... Once daily 4)  Hydrocodone-acetaminophen 5-325 Mg Tabs (Hydrocodone-acetaminophen) .... One by mouth three times a day as needed 5)  Desoximetasone 0.25 % Crea (Desoximetasone) .... Apply to rash prn 6)  Amitriptyline Hcl 25 Mg Tabs (Amitriptyline hcl) .Marland Kitchen.. 1 once daily 7)  Hypercare 20 % Soln (Aluminum chloride) .... Apply inder arms once daily 8)  Klor-con M20 20 Meq Cr-tabs (Potassium chloride crys cr) .... One by mouth daily 9)  Nexium 40 Mg Cpdr (Esomeprazole magnesium) .... One by mouth daily 10)  Ferrous Sulfate 324 Mg Tbec (Ferrous sulfate) .... One twice a day with food 11)  Etodolac 300 Mg Caps (Etodolac) .... One by mouth bid 12)  Cymbalta 60 Mg Cpep (Duloxetine hcl) .... One by mouth  each day 13)  Allegra 180 Mg Tabs (Fexofenadine hcl) .... Take 1 tablet by mouth once a day as needed allergies 14)  Toviaz  8 Mg Xr24h-tab (Fesoterodine fumarate) .... One by mouth daily 15)  Tussionex Pennkinetic Er 8-10 Mg/37ml Lqcr (Chlorpheniramine-hydrocodone) .Marland Kitchen.. 1 tsp every 12 hours as needed cough 16)  Miralax Powd (Polyethylene glycol 3350) .Marland KitchenMarland Kitchen. 17 gms a day in water  Hypertension Assessment/Plan:      The patient's hypertensive risk group is category B: At least one risk factor (excluding diabetes) with no target organ damage.  Today's blood pressure is 130/72.  Her blood pressure goal is < 140/90.  Patient Instructions: 1)  "colon cleanse" at [facility] 2)  Please schedule a follow-up appointment in 3 months. Prescriptions: ALLEGRA 180 MG TABS (FEXOFENADINE HCL) Take 1 tablet by mouth once a day as needed allergies  #30 x 6   Entered by:   Willy Eddy, LPN   Authorized by:   Stacie Glaze MD   Signed by:   Willy Eddy, LPN on 47/82/9562   Method used:   Electronically to        [facility] [address] [phone]* (retail)  [address]       [place], [place]  [address]       Ph: [phone] or [phone]       Fax: [phone]   RxID:   [id number] KLOR-CON M20 20 MEQ CR-TABS (POTASSIUM CHLORIDE CRYS CR) one by mouth daily  #90 x 3   Entered by:   Willy Eddy, LPN   Authorized by:   Stacie Glaze MD   Signed by:   Willy Eddy, LPN on 27/25/3664   Method used:   [facility]        [facility] [address] [phone]* (retail)       [address]       [place], [place]  [address]       Ph: [phone] or [phone]       Fax: [phone]   RxID:   [id number] DESOXIMETASONE 0.25 %  CREA (DESOXIMETASONE) APPLY to rash prn  #60 Gram x 3   Entered by:   Willy Eddy, LPN   Authorized by:   Stacie Glaze MD   Signed by:   Willy Eddy, LPN on 62/37/6283   Method used:   [facility]        [facility] [address] [phone]* (retail)       [address]       [place], [place]   [address]       Ph: [phone] or [phone]       Fax: [phone]   RxID:   [id number]

## 2010-07-24 NOTE — Assessment & Plan Note (Signed)
Summary: 2 month follow up/cjr   Vital Signs:  Patient profile:   77 year old female Height:      66 inches Weight:      188 pounds BMI:     30.45 Temp:     98.2 degrees F oral Pulse rate:   100 / minute Resp:     14 per minute BP sitting:   170 / 90  (left arm)  Vitals Entered By: Willy Eddy, LPN (May 04, 2010 10:06 AM) CC: roa Is Patient Diabetic? No   Primary Care Provider:  Darryll Capers MD  CC:  roa.  History of Present Illness: presents with severe shoulder pain in the anterior shoulder. Rates the pain as 8/10 had a shot by the PA at the ortho office in the posterior aspects of the shoulder that did not help prior surgery with hx of pinning  Hypertension Follow-Up      This is a 77 year old woman who presents for Hypertension follow-up.  The patient denies lightheadedness, urinary frequency, headaches, edema, impotence, rash, and fatigue.  The patient denies the following associated symptoms: chest pain, chest pressure, exercise intolerance, dyspnea, palpitations, syncope, leg edema, and pedal edema.  Compliance with medications (by patient report) has been near 100%.  The patient reports that dietary compliance has been good.    Preventive Screening-Counseling & Management  Alcohol-Tobacco     Smoking Status: quit     Year Started: 1980     Year Quit: 1990     Tobacco Counseling: to remain off tobacco products  Problems Prior to Update: 1)  Renal Cyst  (ICD-593.2) 2)  Constipation  (ICD-564.00) 3)  Osteoarthritis  (ICD-715.9) 4)  Nausea and Vomiting  (ICD-787.01) 5)  Ulcer-gastric  (ICD-531.9) 6)  Nausea Alone  (ICD-787.02) 7)  Abdominal Pain, Epigastric  (ICD-789.06) 8)  Polydipsia  (ICD-783.5) 9)  Nocturia  (ICD-788.43) 10)  Loc Osteoarthros Not Spec Prim/sec [place] Region  (ICD-715.31) 11)  Anemia  (ICD-285.9) 12)  Hx of Personal History of Peptic Ulcer Disease  (ICD-V12.71) 13)  Iron 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  (ICD-280.0) 14)   Insomnia  (ICD-780.52) 15)  Sciatica, Right  (ICD-724.3) 16)  Pud  (ICD-533.90) 17)  Esophagitis, Hx of  (ICD-V12.79) 18)  Hiatal Hernia  (ICD-553.3) 19)  Diverticulosis of Colon  (ICD-562.10) 20)  Low Back Pain  (ICD-724.2) 21)  Esophageal Motility Disorder  (ICD-530.5) 22)  Constipation, Chronic  (ICD-564.09) 23)  Hyperglycemia, Borderline  (ICD-790.29) 24)  Cough, Chronic  (ICD-786.2) 25)  Esophageal Stricture  (ICD-530.3) 26)  Hypertension  (ICD-401.9) 27)  Gerd  (ICD-530.81)  Current Problems (verified): 1)  Renal Cyst  (ICD-593.2) 2)  Constipation  (ICD-564.00) 3)  Osteoarthritis  (ICD-715.9) 4)  Nausea and Vomiting  (ICD-787.01) 5)  Ulcer-gastric  (ICD-531.9) 6)  Nausea Alone  (ICD-787.02) 7)  Abdominal Pain, Epigastric  (ICD-789.06) 8)  Polydipsia  (ICD-783.5) 9)  Nocturia  (ICD-788.43) 10)  Loc Osteoarthros Not Spec Prim/sec [place] Region  (ICD-715.31) 11)  Anemia  (ICD-285.9) 12)  Hx of Personal History of Peptic Ulcer Disease  (ICD-V12.71) 13)  Iron 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  (ICD-280.0) 14)  Insomnia  (ICD-780.52) 15)  Sciatica, Right  (ICD-724.3) 16)  Pud  (ICD-533.90) 17)  Esophagitis, Hx of  (ICD-V12.79) 18)  Hiatal Hernia  (ICD-553.3) 19)  Diverticulosis of Colon  (ICD-562.10) 20)  Low Back Pain  (ICD-724.2) 21)  Esophageal Motility Disorder  (ICD-530.5) 22)  Constipation, Chronic  (ICD-564.09) 23)  Hyperglycemia, Borderline  (ICD-790.29) 24)  Cough, Chronic  (ICD-786.2) 25)  Esophageal Stricture  (ICD-530.3) 26)  Hypertension  (ICD-401.9) 27)  Gerd  (ICD-530.81)  Medications Prior to Update: 1)  Calan Sr 240 Mg  Tbcr (Verapamil Hcl) .... Once Daily 2)  Daily Multi-Vitamins/iron   Tabs (Multiple Vitamins-Iron) .... Once Daily 3)  Benicar Hct 40-12.5 Mg Tabs (Olmesartan Medoxomil-Hctz) .... One Tablet By Mouth Once Daily 4)  Hydrocodone-Acetaminophen 5-500 Mg Tabs (Hydrocodone-Acetaminophen) .Marland Kitchen.. 1every 4-6 Hours As Needed Pain 5)   Desoximetasone 0.25 %  Crea (Desoximetasone) .... Apply To Rash Prn 6)  Amitriptyline Hcl 25 Mg  Tabs (Amitriptyline Hcl) .Marland Kitchen.. 1 Once Daily 7)  Hypercare 20 % Soln (Aluminum Chloride) .... Apply Inder Arms Once Daily 8)  Klor-Con M20 20 Meq Cr-Tabs (Potassium Chloride Crys Cr) .... One By Mouth Daily 9)  Nexium 40 Mg Cpdr (Esomeprazole Magnesium) .... One By Mouth Daily 10)  Ferrous Sulfate 324 Mg Tbec (Ferrous Sulfate) .... One Twice A Day With Food 11)  Cymbalta 60 Mg Cpep (Duloxetine Hcl) .... One By Mouth  Each Day 12)  Allegra 180 Mg Tabs (Fexofenadine Hcl) .... Take 1 Tablet By Mouth Once A Day As Needed Allergies 13)  Toviaz 8 Mg Xr24h-Tab (Fesoterodine Fumarate) .... One By Mouth Daily 14)  Tussionex Pennkinetic Er 8-10 Mg/38ml Lqcr (Chlorpheniramine-Hydrocodone) .Marland Kitchen.. 1 Tsp Every 12 Hours As Needed Cough 15)  Miralax  Powd (Polyethylene Glycol 3350) .Marland KitchenMarland KitchenMarland Kitchen 17 Gms A Day in Water 16)  Cranberry Fruit 405 Mg Caps (Cranberry) .... 2000mg  Per Day 17)  Allegra 180 Mg Tabs (Fexofenadine Hcl) .Marland Kitchen.. 1 By Mouth Once Daily 18)  Carafate 1 Gm/78ml Susp (Sucralfate) .... Take 1 Gram Between Meals and At Bedtime 19)  Promethazine Hcl 25 Mg Tabs (Promethazine Hcl) .... Take 1/2-1 Tablet By Mouth Every 6 Hours As Needed For Nausea  Current Medications (verified): 1)  Calan Sr 240 Mg  Tbcr (Verapamil Hcl) .... Once Daily 2)  Daily Multi-Vitamins/iron   Tabs (Multiple Vitamins-Iron) .... Once Daily 3)  Benicar Hct 40-12.5 Mg Tabs (Olmesartan Medoxomil-Hctz) .... One Tablet By Mouth Once Daily 4)  Hydrocodone-Acetaminophen 5-500 Mg Tabs (Hydrocodone-Acetaminophen) .Marland Kitchen.. 1every 4-6 Hours As Needed Pain 5)  Desoximetasone 0.25 %  Crea (Desoximetasone) .... Apply To Rash Prn 6)  Amitriptyline Hcl 25 Mg  Tabs (Amitriptyline Hcl) .Marland Kitchen.. 1 Once Daily 7)  Hypercare 20 % Soln (Aluminum Chloride) .... Apply Inder Arms Once Daily 8)  Klor-Con M20 20 Meq Cr-Tabs (Potassium Chloride Crys Cr) .... One By Mouth Daily 9)   Nexium 40 Mg Cpdr (Esomeprazole Magnesium) .... One By Mouth Daily 10)  Ferrous Sulfate 324 Mg Tbec (Ferrous Sulfate) .... One Twice A Day With Food 11)  Cymbalta 60 Mg Cpep (Duloxetine Hcl) .... One By Mouth  Each Day 12)  Allegra 180 Mg Tabs (Fexofenadine Hcl) .... Take 1 Tablet By Mouth Once A Day As Needed Allergies 13)  Toviaz 8 Mg Xr24h-Tab (Fesoterodine Fumarate) .... One By Mouth Daily 14)  Tussionex Pennkinetic Er 8-10 Mg/22ml Lqcr (Chlorpheniramine-Hydrocodone) .Marland Kitchen.. 1 Tsp Every 12 Hours As Needed Cough 15)  Miralax  Powd (Polyethylene Glycol 3350) .Marland KitchenMarland KitchenMarland Kitchen 17 Gms A Day in Water 16)  Cranberry Fruit 405 Mg Caps (Cranberry) .... 2000mg  Per Day 17)  Allegra 180 Mg Tabs (Fexofenadine Hcl) .Marland Kitchen.. 1 By Mouth Once Daily 18)  Carafate 1 Gm/56ml Susp (Sucralfate) .... Take 1 Gram Between Meals and At Bedtime 19)  Promethazine Hcl 25 Mg Tabs (Promethazine Hcl) .... Take 1/2-1 Tablet  By Mouth Every 6 Hours As Needed For Nausea  Allergies (verified): 1)  Nsaids  Past History:  Family History: Last updated: 02/21/2010 Family History Diabetes 1st degree relative Family History of Heart Disease:  No FH of Colon Cancer:  Social History: Last updated: 03/14/2008 Occupation: Conservation officer, nature Former Smoker Alcohol use-no Drug use-no Regular exercise-no  Risk Factors: Exercise: no (03/12/2007)  Risk Factors: Smoking Status: quit (05/04/2010)  Past medical, surgical, family and social histories (including risk factors) reviewed, and no changes noted (except as noted below).  Past Medical History: Reviewed history from 02/21/2010 and no changes required. Menopause Low back pain PUD (ICD-533.90) ESOPHAGITIS, HX OF (ICD-V12.79) DIVERTICULOSIS OF COLON (ICD-562.10) LOW BACK PAIN (ICD-724.2) ESOPHAGEAL MOTILITY DISORDER (ICD-530.5) CONSTIPATION, CHRONIC (ICD-564.09) HYPERGLYCEMIA, BORDERLINE (ICD-790.29) COUGH, CHRONIC (ICD-786.2) ESOPHAGEAL STRICTURE (ICD-530.3) HYPERTENSION (ICD-401.9) GERD  (ICD-530.81)  Past Surgical History: Reviewed history from 12/09/2008 and no changes required. Hysterectomy Rotator cuff repair graves Oophorectomy cataracts on left and right ( [facility])  Family History: Reviewed history from 02/21/2010 and no changes required. Family History Diabetes 1st degree relative Family History of Heart Disease:  No FH of Colon Cancer:  Social History: Reviewed history from 03/14/2008 and no changes required. Occupation: Conservation officer, nature Former Smoker Alcohol use-no Drug use-no Regular exercise-no  Review of Systems       shoulder pain rated 9/10  Physical Exam  General:  Well developed, well nourished, no acute distress. Head:  Normocephalic and atraumatic. Ears:  R ear normal and L ear normal.   Nose:  nasal dischargemucosal pallor and mucosal erythema.   Mouth:  No oral lesions. Tongue moist.  Neck:  Supple; no masses. Lungs:  Clear throughout to auscultation.no crackles and no wheezes.   Heart:  normal rate and regular rhythm.   Abdomen:  soft and non-tender.   Msk:  no joint tenderness, no joint swelling, and no joint warmth.   Neurologic:  alert & oriented X3 and finger-to-nose normal.     Impression & Recommendations:  Problem # 1:  LOC OSTEOARTHROS NOT SPEC PRIM/SEC SHLDR REGION (ICD-715.31) Assessment Deteriorated Informed consent obtained and then the  left shoulder joint was prepped in a sterile manor and 40 mg depo and 1/2 cc 1% lidocaine injected into the synovial space. After care discussed. Pt tolerated procedure well.  previous surgery in the shoulder with pin and screw... now with severe pain and muscle alteration Her updated medication list for this problem includes:    Hydrocodone-acetaminophen 5-500 Mg Tabs (Hydrocodone-acetaminophen) .Marland Kitchen... 1every 4-6 hours as needed pain  Discussed use of medications, application of heat or cold, and exercises.   Orders: Orthopedic Referral (Ortho) Joint Aspirate / Injection, Large  (20610) Depo- Medrol 40mg  (J1030)  Problem # 2:  HYPERTENSION (ICD-401.9) the pain is causeing the levations in the pusle and the blod pressure Her updated medication list for this problem includes:    Calan Sr 240 Mg Tbcr (Verapamil hcl) ..... Once daily    Benicar Hct 40-12.5 Mg Tabs (Olmesartan medoxomil-hctz) ..... One tablet by mouth once daily  BP today: 170/90 Prior BP: 142/78 (03/23/2010)  Prior 10 Yr Risk Heart Disease: Not enough information (05/05/2007)  Labs Reviewed: K+: 4.4 (04/27/2010) Creat: : 1.1 (04/27/2010)     Problem # 3:  PUD (ICD-533.90)  Her updated medication list for this problem includes:    Nexium 40 Mg Cpdr (Esomeprazole magnesium) ..... One by mouth daily    Carafate 1 Gm/20ml Susp (Sucralfate) .Marland Kitchen... Take 1 gram between meals and at bedtime  EGD:  Location: [facility]  1) Ring, esophageal in the distal esophagus (non-obstructive) 2) 2 cm hiatal hernia 3) Abnormal mucosa at the gastroesophageal junction (MILD GERD CHANGES) 4) 8 mm ulcer in the antrum (BX BENIGN INFLAMMATION?FIBROSIS) 5) Erosions, multiple in the antrum 6) Otherwise normal examination 7) She reported new vaginal bleeding today (09/20/2008)  Labs Reviewed: Hgb: 11.3 (02/21/2010)   Hct: 33.2 (02/21/2010)     Problem # 4:  COUGH, CHRONIC (ICD-786.2) stable  Complete Medication List: 1)  Calan Sr 240 Mg Tbcr (Verapamil hcl) .... Once daily 2)  Daily Multi-vitamins/iron Tabs (Multiple vitamins-iron) .... Once daily 3)  Benicar Hct 40-12.5 Mg Tabs (Olmesartan medoxomil-hctz) .... One tablet by mouth once daily 4)  Hydrocodone-acetaminophen 5-500 Mg Tabs (Hydrocodone-acetaminophen) .Marland Kitchen.. 1every 4-6 hours as needed pain 5)  Desoximetasone 0.25 % Crea (Desoximetasone) .... Apply to rash prn 6)  Amitriptyline Hcl 25 Mg Tabs (Amitriptyline hcl) .Marland Kitchen.. 1 once daily 7)  Hypercare 20 % Soln (Aluminum chloride) .... Apply inder arms once daily 8)  Klor-con M20 20 Meq Cr-tabs  (Potassium chloride crys cr) .... One by mouth daily 9)  Nexium 40 Mg Cpdr (Esomeprazole magnesium) .... One by mouth daily 10)  Ferrous Sulfate 324 Mg Tbec (Ferrous sulfate) .... One twice a day with food 11)  Cymbalta 60 Mg Cpep (Duloxetine hcl) .... One by mouth  each day 12)  Allegra 180 Mg Tabs (Fexofenadine hcl) .... Take 1 tablet by mouth once a day as needed allergies 13)  Toviaz 8 Mg Xr24h-tab (Fesoterodine fumarate) .... One by mouth daily 14)  Tussionex Pennkinetic Er 8-10 Mg/83ml Lqcr (Chlorpheniramine-hydrocodone) .Marland Kitchen.. 1 tsp every 12 hours as needed cough 15)  Miralax Powd (Polyethylene glycol 3350) .Marland KitchenMarland Kitchen. 17 gms a day in water 16)  Cranberry Fruit 405 Mg Caps (Cranberry) .... 2000mg  per day 17)  Allegra 180 Mg Tabs (Fexofenadine hcl) .Marland Kitchen.. 1 by mouth once daily 18)  Carafate 1 Gm/79ml Susp (Sucralfate) .... Take 1 gram between meals and at bedtime 19)  Promethazine Hcl 25 Mg Tabs (Promethazine hcl) .... Take 1/2-1 tablet by mouth every 6 hours as needed for nausea  Patient Instructions: 1)  Please schedule a follow-up appointment in 3 months. Prescriptions: HYDROCODONE-ACETAMINOPHEN 5-500 MG TABS (HYDROCODONE-ACETAMINOPHEN) 1every 4-6 hours as needed pain  #50 x 3   Entered and Authorized by:   Stacie Glaze MD   Signed by:   Stacie Glaze MD on 05/04/2010   Method used:   Print then Give to Patient   RxID:   [id number]    Orders Added: 1)  Est. Patient Level IV [14782] 2)  Orthopedic Referral [Ortho] 3)  Joint Aspirate / Injection, Large [20610] 4)  Depo- Medrol 40mg  [J1030]

## 2010-07-24 NOTE — Assessment & Plan Note (Signed)
Summary: STOMACH PROBLEMS...EM    History of Present Illness Primary GI MD: Lina Sar MD Primary Lonza Shimabukuro: Darryll Capers MD Requesting Shiasia Porro: n/a Chief Complaint: Intermittant upper abd pain with constant nausea. Pt takes Tums which she states takes care of the symptoms most of the time.  History of Present Illness:   This is a 77 year old, African American female with peptic ulcer disease since 1987 as well as H. pylori. She has gastroesophageal reflux disease and was evaluated for an antireflux procedure in 2008. She had a normal esophageal manometry but she was unable to cooperate for a 24 pH probe. A barium swallow in 2008 showed spasm in the distal esophagus. An upper endoscopy in July 2009 showed a mild esophageal stricture which was dilated to 17 mm. There was a 3 cm hiatal hernia. Her most recent endoscopy in March 2010 did not show any stricture. Her most recent upper abdominal ultrasound showed small renal cysts. She has been markedly improved since she saw Sondra Come PA  4 weeks ago. Her only complaint is mild nausea.   GI Review of Systems    Reports abdominal pain and  nausea.     Location of  Abdominal pain: upper abdomen.    Denies acid reflux, belching, bloating, chest pain, dysphagia with liquids, dysphagia with solids, heartburn, loss of appetite, vomiting, vomiting blood, weight loss, and  weight gain.      Reports constipation.     Denies anal fissure, black tarry stools, change in bowel habit, diarrhea, diverticulosis, fecal incontinence, heme positive stool, hemorrhoids, irritable bowel syndrome, jaundice, light color stool, liver problems, rectal bleeding, and  rectal pain.    Current Medications (verified): 1)  Calan Sr 240 Mg  Tbcr (Verapamil Hcl) .... Once Daily 2)  Daily Multi-Vitamins/iron   Tabs (Multiple Vitamins-Iron) .... Once Daily 3)  Benicar Hct 40-12.5 Mg Tabs (Olmesartan Medoxomil-Hctz) .... One Tablet By Mouth Once Daily 4)   Hydrocodone-Acetaminophen 5-325 Mg Tabs (Hydrocodone-Acetaminophen) .... One By Mouth Three Times A Day As Needed 5)  Desoximetasone 0.25 %  Crea (Desoximetasone) .... Apply To Rash Prn 6)  Amitriptyline Hcl 25 Mg  Tabs (Amitriptyline Hcl) .Marland Kitchen.. 1 Once Daily 7)  Hypercare 20 % Soln (Aluminum Chloride) .... Apply Inder Arms Once Daily 8)  Klor-Con M20 20 Meq Cr-Tabs (Potassium Chloride Crys Cr) .... One By Mouth Daily 9)  Nexium 40 Mg Cpdr (Esomeprazole Magnesium) .... One By Mouth Daily 10)  Ferrous Sulfate 324 Mg Tbec (Ferrous Sulfate) .... One Twice A Day With Food 11)  Cymbalta 60 Mg Cpep (Duloxetine Hcl) .... One By Mouth  Each Day 12)  Allegra 180 Mg Tabs (Fexofenadine Hcl) .... Take 1 Tablet By Mouth Once A Day As Needed Allergies 13)  Toviaz 8 Mg Xr24h-Tab (Fesoterodine Fumarate) .... One By Mouth Daily 14)  Tussionex Pennkinetic Er 8-10 Mg/62ml Lqcr (Chlorpheniramine-Hydrocodone) .Marland Kitchen.. 1 Tsp Every 12 Hours As Needed Cough 15)  Miralax  Powd (Polyethylene Glycol 3350) .Marland KitchenMarland KitchenMarland Kitchen 17 Gms A Day in Water 16)  Cranberry Fruit 405 Mg Caps (Cranberry) .... 2000mg  Per Day 17)  Allegra 180 Mg Tabs (Fexofenadine Hcl) .Marland Kitchen.. 1 By Mouth Once Daily 18)  Carafate 1 Gm/89ml Susp (Sucralfate) .... Take 1 Gram Between Meals and At Bedtime  Allergies (verified): 1)  Nsaids  Past History:  Past Medical History: Reviewed history from 02/21/2010 and no changes required. Menopause Low back pain PUD (ICD-533.90) ESOPHAGITIS, HX OF (ICD-V12.79) DIVERTICULOSIS OF COLON (ICD-562.10) LOW BACK PAIN (ICD-724.2) ESOPHAGEAL MOTILITY DISORDER (ICD-530.5)  CONSTIPATION, CHRONIC (ICD-564.09) HYPERGLYCEMIA, BORDERLINE (ICD-790.29) COUGH, CHRONIC (ICD-786.2) ESOPHAGEAL STRICTURE (ICD-530.3) HYPERTENSION (ICD-401.9) GERD (ICD-530.81)  Past Surgical History: Reviewed history from 12/09/2008 and no changes required. Hysterectomy Rotator cuff repair graves Oophorectomy cataracts on left and right ( T  [facility])  Family History: Reviewed history from 02/21/2010 and no changes required. Family History Diabetes 1st degree relative Family History of Heart Disease:  No FH of Colon Cancer:  Social History: Reviewed history from 03/14/2008 and no changes required. Occupation: Conservation officer, nature Former Smoker Alcohol use-no Drug use-no Regular exercise-no  Review of Systems  The patient denies allergy/sinus, anemia, anxiety-new, arthritis/joint pain, back pain, blood in urine, breast changes/lumps, change in vision, confusion, cough, coughing up blood, depression-new, fainting, fatigue, fever, headaches-new, hearing problems, heart murmur, heart rhythm changes, itching, menstrual pain, muscle pains/cramps, night sweats, nosebleeds, pregnancy symptoms, shortness of breath, skin rash, sleeping problems, sore throat, swelling of feet/legs, swollen lymph glands, thirst - excessive , urination - excessive , urination changes/pain, urine leakage, vision changes, and voice change.         Pertinent positive and negative review of systems were noted in the above HPI. All other ROS was otherwise negative.   Vital Signs:  Patient profile:   77 year old female Height:      66 inches Weight:      191 pounds BMI:     30.94 BSA:     1.96 Pulse rate:   100 / minute Pulse rhythm:   regular BP sitting:   142 / 78  (right arm)  Vitals Entered By: Christie Nottingham CMA Duncan Dull) (March 23, 2010 10:11 AM)   Impression & Recommendations:  Problem # 1:  NAUSEA AND VOMITING (ICD-787.01) Patient has improved on Nexium 40 mg daily. An upper abdominal ultrasound showed small renal cysts.  Problem # 2:  ULCER-GASTRIC (ICD-531.9) Patient has a history of H. pylori related gastric ulcer treated in 1987. There was no evidence of recurrence on her last endoscopy in March 2010.No need for repeat EGD since she is improved  Problem # 3:  DIVERTICULOSIS OF COLON (ICD-562.10) Patient's last colonoscopy was in July 2008. A  recall will be due in 10 years.  Patient Instructions: 1)  continue Nexium 40 mg daily. 2)  Phenergan 25 mg daily 1/2-1 tablet every 6 hours as needed. 3)  Recall colonoscopy July 2018. 4)  Return p.r.n. 5)  Copy sent to : Dr J.Jenkins 6)  The medication list was reviewed and reconciled.  All changed / newly prescribed medications were explained.  A complete medication list was provided to the patient / caregiver. Prescriptions: PROMETHAZINE HCL 25 MG TABS (PROMETHAZINE HCL) Take 1/2-1 tablet by mouth every 6 hours as needed for nausea  #30 x 1   Entered by:   Lamona Curl CMA (AAMA)   Authorized by:   Hart Carwin MD   Signed by:   Lamona Curl CMA (AAMA) on 03/23/2010   Method used:   Electronically to        [facility] [address] [id number][facility] (retail)       [address]       [place], [place]  [address]       Ph: [phone] or [phone]       Fax: [phone]   RxID:   [id number]

## 2010-07-24 NOTE — Assessment & Plan Note (Signed)
Summary: 3 MONTH ROV/NJR   Vital Signs:  Patient profile:   77 year old female Height:      66 inches Weight:      196 pounds BMI:     31.75 Temp:     98.2 degrees F oral Pulse rate:   60 / minute Resp:     14 per minute BP sitting:   140 / 80  (left arm)  Vitals Entered By: Willy Eddy, LPN (July 21, 2009 9:24 AM) CC: roa, Hypertension Management   Primary Care Provider:  Peri Jefferson  CC:  roa and Hypertension Management.  History of Present Illness: Cough is better blood pressure is stable has shoulder and neck pain has been evaluated by dr Luiz Blare has pain in the knees that were helped by Dr Luiz Blare injecton in the right knee worries about DM ( family hx and increased thrist and sugar drives ( soda)   Hypertension History:      She denies headache, chest pain, palpitations, dyspnea with exertion, orthopnea, PND, peripheral edema, visual symptoms, neurologic problems, syncope, and side effects from treatment.        Positive major cardiovascular risk factors include female age 26 years old or older and hypertension.  Negative major cardiovascular risk factors include non-tobacco-user status.     Preventive Screening-Counseling & Management  Alcohol-Tobacco     Smoking Status: quit     Year Started: 1980     Year Quit: 1990  Problems Prior to Update: 1)  Nocturia  (ICD-788.43) 2)  Loc Osteoarthros Not Spec Prim/sec [employer]  (ICD-715.31) 3)  Anemia  (ICD-285.9) 4)  Hx of Personal History of Peptic Ulcer Disease  (ICD-V12.71) 5)  Iron 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  (ICD-280.0) 6)  Insomnia  (ICD-780.52) 7)  Sciatica, Right  (ICD-724.3) 8)  Pud  (ICD-533.90) 9)  Esophagitis, Hx of  (ICD-V12.79) 10)  Hiatal Hernia  (ICD-553.3) 11)  Diverticulosis of Colon  (ICD-562.10) 12)  Low Back Pain  (ICD-724.2) 13)  Esophageal Motility Disorder  (ICD-530.5) 14)  Constipation, Chronic  (ICD-564.09) 15)  Hyperglycemia, Borderline   (ICD-790.29) 16)  Cough, Chronic  (ICD-786.2) 17)  Esophageal Stricture  (ICD-530.3) 18)  Hypertension  (ICD-401.9) 19)  Gerd  (ICD-530.81)  Current Problems (verified): 1)  Nocturia  (ICD-788.43) 2)  Loc Osteoarthros Not Spec Prim/sec [place]  (ICD-715.31) 3)  Anemia  (ICD-285.9) 4)  Hx of Personal History of Peptic Ulcer Disease  (ICD-V12.71) 5)  Iron 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  (ICD-280.0) 6)  Insomnia  (ICD-780.52) 7)  Sciatica, Right  (ICD-724.3) 8)  Pud  (ICD-533.90) 9)  Esophagitis, Hx of  (ICD-V12.79) 10)  Hiatal Hernia  (ICD-553.3) 11)  Diverticulosis of Colon  (ICD-562.10) 12)  Low Back Pain  (ICD-724.2) 13)  Esophageal Motility Disorder  (ICD-530.5) 14)  Constipation, Chronic  (ICD-564.09) 15)  Hyperglycemia, Borderline  (ICD-790.29) 16)  Cough, Chronic  (ICD-786.2) 17)  Esophageal Stricture  (ICD-530.3) 18)  Hypertension  (ICD-401.9) 19)  Gerd  (ICD-530.81)  Medications Prior to Update: 1)  Calan Sr 240 Mg  Tbcr (Verapamil Hcl) .... Once Daily 2)  Daily Multi-Vitamins/iron   Tabs (Multiple Vitamins-Iron) .... Once Daily 3)  Benicar Hct 40-25 Mg  Tabs (Olmesartan Medoxomil-Hctz) .... Once Daily 4)  Hydrocodone-Acetaminophen 5-325 Mg Tabs (Hydrocodone-Acetaminophen) .... One By Mouth Three Times A Day As Needed 5)  Desoximetasone 0.25 %  Crea (Desoximetasone) .... Apply To Rash Prn 6)  Metoclopramide Hcl 5 Mg  Tabs (Metoclopramide  Hcl) .... One By Mouth Q Ac Lunch and Dinner 7)  Amitriptyline Hcl 25 Mg  Tabs (Amitriptyline Hcl) .Marland Kitchen.. 1 Once Daily 8)  Hydromet 5-1.5 Mg/46ml Syrp (Hydrocodone-Homatropine) .... One Tsp By Mouth Every 6 Hours As Needed For Cough 9)  Hypercare 20 % Soln (Aluminum Chloride) .... Apply Inder Arms Once Daily 10)  Klor-Con M20 20 Meq Cr-Tabs (Potassium Chloride Crys Cr) .... One By Mouth Daily 11)  Nexium 40 Mg Cpdr (Esomeprazole Magnesium) .... One By Mouth Daily 12)  Ferrous Sulfate 324 Mg Tbec (Ferrous Sulfate) .... One  Twice A Day With Food 13)  Etodolac 300 Mg Caps (Etodolac) .... One By Mouth Bid 14)  Cymbalta 60 Mg Cpep (Duloxetine Hcl) .... One By Mouth  Each Day 15)  Allegra 180 Mg Tabs (Fexofenadine Hcl) .... Take 1 Tablet By Mouth Once A Day As Needed Allergies 16)  Toviaz 8 Mg Xr24h-Tab (Fesoterodine Fumarate) .... One By Mouth Daily  Current Medications (verified): 1)  Calan Sr 240 Mg  Tbcr (Verapamil Hcl) .... Once Daily 2)  Daily Multi-Vitamins/iron   Tabs (Multiple Vitamins-Iron) .... Once Daily 3)  Benicar Hct 40-25 Mg  Tabs (Olmesartan Medoxomil-Hctz) .... Once Daily 4)  Hydrocodone-Acetaminophen 5-325 Mg Tabs (Hydrocodone-Acetaminophen) .... One By Mouth Three Times A Day As Needed 5)  Desoximetasone 0.25 %  Crea (Desoximetasone) .... Apply To Rash Prn 6)  Amitriptyline Hcl 25 Mg  Tabs (Amitriptyline Hcl) .Marland Kitchen.. 1 Once Daily 7)  Hypercare 20 % Soln (Aluminum Chloride) .... Apply Inder Arms Once Daily 8)  Klor-Con M20 20 Meq Cr-Tabs (Potassium Chloride Crys Cr) .... One By Mouth Daily 9)  Nexium 40 Mg Cpdr (Esomeprazole Magnesium) .... One By Mouth Daily 10)  Ferrous Sulfate 324 Mg Tbec (Ferrous Sulfate) .... One Twice A Day With Food 11)  Etodolac 300 Mg Caps (Etodolac) .... One By Mouth Bid 12)  Cymbalta 60 Mg Cpep (Duloxetine Hcl) .... One By Mouth  Each Day 13)  Allegra 180 Mg Tabs (Fexofenadine Hcl) .... Take 1 Tablet By Mouth Once A Day As Needed Allergies 14)  Toviaz 8 Mg Xr24h-Tab (Fesoterodine Fumarate) .... One By Mouth Daily  Allergies (verified): No Known Drug Allergies  Past History:  Family History: Last updated: 03/10/2008 Family History Diabetes 1st degree relative Family History of Heart Disease:   Social History: Last updated: 03/14/2008 Occupation: Conservation officer, nature Former Smoker Alcohol use-no Drug use-no Regular exercise-no  Risk Factors: Exercise: no (03/12/2007)  Risk Factors: Smoking Status: quit (07/21/2009)  Past medical, surgical, family and social  histories (including risk factors) reviewed, and no changes noted (except as noted below).  Past Medical History: Reviewed history from 09/19/2008 and no changes required. Menopause Low back pain Current Problems:  PUD (ICD-533.90) ESOPHAGITIS, HX OF (ICD-V12.79) HIATAL HERNIA (ICD-553.3) DIVERTICULOSIS OF COLON (ICD-562.10) LOW BACK PAIN (ICD-724.2) ESOPHAGEAL MOTILITY DISORDER (ICD-530.5) CONSTIPATION, CHRONIC (ICD-564.09) HYPERGLYCEMIA, BORDERLINE (ICD-790.29) COUGH, CHRONIC (ICD-786.2) ESOPHAGEAL STRICTURE (ICD-530.3) HYPERTENSION (ICD-401.9) GERD (ICD-530.81) Gastroparesis  Past Surgical History: Reviewed history from 12/09/2008 and no changes required. Hysterectomy Rotator cuff repair graves Oophorectomy cataracts on left and right ( [facility])  Family History: Reviewed history from 03/10/2008 and no changes required. Family History Diabetes 1st degree relative Family History of Heart Disease:   Social History: Reviewed history from 03/14/2008 and no changes required. Occupation: Conservation officer, nature Former Smoker Alcohol use-no Drug use-no Regular exercise-no  Review of Systems  The patient denies anorexia, fever, weight loss, weight gain, vision loss, decreased hearing, hoarseness, chest pain, syncope, dyspnea on exertion, peripheral edema,  prolonged cough, headaches, hemoptysis, abdominal pain, melena, hematochezia, severe indigestion/heartburn, hematuria, incontinence, genital sores, muscle weakness, suspicious skin lesions, transient blindness, difficulty walking, depression, unusual weight change, abnormal bleeding, enlarged lymph nodes, angioedema, and breast masses.    Physical Exam  General:  Well developed, well nourished, no acute distress. Head:  Normocephalic and atraumatic. Ears:  R ear normal and L ear normal.   Nose:  nasal dischargemucosal pallor and mucosal erythema.   Mouth:  No oral lesions. Tongue moist.  Neck:  Supple; no masses. Lungs:  Clear  throughout to auscultation.no crackles and no wheezes.   Heart:  normal rate and regular rhythm.   Abdomen:  soft and non-tender.   Msk:  no joint tenderness, no joint swelling, and no joint warmth.   Neurologic:  alert & oriented X3 and DTRs symmetrical and normal.     Impression & Recommendations:  Problem # 1:  HYPERTENSION (ICD-401.9) Assessment Unchanged  Her updated medication list for this problem includes:    Calan Sr 240 Mg Tbcr (Verapamil hcl) ..... Once daily    Benicar Hct 40-25 Mg Tabs (Olmesartan medoxomil-hctz) ..... Once daily repeat 120.80 BP today: 140/80 Prior BP: 120/60 (04/21/2009)  Prior 10 Yr Risk Heart Disease: Not enough information (05/05/2007)  Labs Reviewed: K+: 5.0 (05/05/2007) Creat: : 1.4 (05/05/2007)     Problem # 2:  ESOPHAGEAL MOTILITY DISORDER (ICD-530.5) cough improved  Problem # 3:  POLYDIPSIA (ICD-783.5) Assessment: Deteriorated  Orders: Venipuncture (41324) TLB-BMP (Basic Metabolic Panel-BMET) (80048-METABOL) TLB-A1C / Hgb A1C (Glycohemoglobin) (83036-A1C)  Problem # 4:  IRON DEFICIENCY ANEMIA SECONDARY TO BLOOD LOSS (ICD-280.0) Assessment: Unchanged  Her updated medication list for this problem includes:    Ferrous Sulfate 324 Mg Tbec (Ferrous sulfate) ..... One twice a day with food  Hgb: 11.6 (01/27/2009)   Hct: 34.4 (01/27/2009)   Platelets: 327.0 (01/27/2009) RBC: 3.86 (01/27/2009)   RDW: 13.2 (01/27/2009)   WBC: 5.7 (01/27/2009) MCV: 89.3 (01/27/2009)   MCHC: 33.6 (01/27/2009) Ferritin: 181.5 (09/19/2008) Iron: 79 (01/27/2009)   % Sat: 6.1 (09/16/2008) B12: 427 (09/16/2008)   Folate: 9.0 (09/16/2008)     Complete Medication List: 1)  Calan Sr 240 Mg Tbcr (Verapamil hcl) .... Once daily 2)  Daily Multi-vitamins/iron Tabs (Multiple vitamins-iron) .... Once daily 3)  Benicar Hct 40-25 Mg Tabs (Olmesartan medoxomil-hctz) .... Once daily 4)  Hydrocodone-acetaminophen 5-325 Mg Tabs (Hydrocodone-acetaminophen) .... One by  mouth three times a day as needed 5)  Desoximetasone 0.25 % Crea (Desoximetasone) .... Apply to rash prn 6)  Amitriptyline Hcl 25 Mg Tabs (Amitriptyline hcl) .Marland Kitchen.. 1 once daily 7)  Hypercare 20 % Soln (Aluminum chloride) .... Apply inder arms once daily 8)  Klor-con M20 20 Meq Cr-tabs (Potassium chloride crys cr) .... One by mouth daily 9)  Nexium 40 Mg Cpdr (Esomeprazole magnesium) .... One by mouth daily 10)  Ferrous Sulfate 324 Mg Tbec (Ferrous sulfate) .... One twice a day with food 11)  Etodolac 300 Mg Caps (Etodolac) .... One by mouth bid 12)  Cymbalta 60 Mg Cpep (Duloxetine hcl) .... One by mouth  each day 13)  Allegra 180 Mg Tabs (Fexofenadine hcl) .... Take 1 tablet by mouth once a day as needed allergies 14)  Toviaz 8 Mg Xr24h-tab (Fesoterodine fumarate) .... One by mouth daily  Other Orders: TLB-CBC Platelet - w/Differential (85025-CBCD) TLB-IBC Pnl (Iron/FE;Transferrin) (83550-IBC)  Hypertension Assessment/Plan:      The patient's hypertensive risk group is category B: At least one risk factor (excluding diabetes) with no target  organ damage.  Today's blood pressure is 140/80.  Her blood pressure goal is < 140/90.  Patient Instructions: 1)  Please schedule a follow-up appointment in 3 months.

## 2010-07-24 NOTE — Assessment & Plan Note (Signed)
Summary: M5A/RCD  Medications Added DRYSOL   SOLN (ALUMINUM CHLORIDE SOLN) as needed DAILY MULTI-VITAMINS/IRON   TABS (MULTIPLE VITAMINS-IRON) once daily QUESTRAN 4 GM  PACK (CHOLESTYRAMINE) before meals and at bedtiome BENICAR HCT 40-25 MG  TABS (OLMESARTAN MEDOXOMIL-HCTZ) once daily CYMBALTA 60 MG  CPEP (DULOXETINE HCL) once daily ALLEGRA-D 12 HOUR   TB12 (FEXOFENADINE-PSEUDOEPHEDRINE TB12) as needed TUSSIONEX PENNKINETIC ER 8-10 MG/5ML  LQCR (CHLORPHENIRAMINE-HYDROCODONE) one tsp by mouth two times a day for 30 days        Vital Signs:  Patient Profile:   77 Years Old Female Height:     67 inches Weight:      200 pounds Temp:     97 degrees F 98.6 Pulse rate:   80 / minute Resp:     14 per minute BP sitting:   140 / 74  (left arm)  Vitals Entered By: Willy Eddy, LPN (March 12, 2007 2:14 PM)                5  Chief Complaint:  roa.  History of Present Illness: chronic cough and follow up of chronic medical problems see list  Hypertension History:      She complains of headache, but denies chest pain, palpitations, dyspnea with exertion, orthopnea, PND, peripheral edema, visual symptoms, and neurologic problems.  She notes no problems with any antihypertensive medication side effects.        Positive major cardiovascular risk factors include female age 41 years old or older and hypertension.  Negative major cardiovascular risk factors include non-tobacco-user status.     Current Allergies: No known allergies   Past Medical History:    Reviewed history from 12/17/2006 and no changes required:       GERD       Hypertension       menopause  Past Surgical History:    Reviewed history and no changes required:       colonoscopy       Hysterectomy       EGD with dilation   Family History:    Family History Diabetes 1st degree relative  Social History:    Reviewed history and no changes required:       Occupation:       Former Smoker       Alcohol  use-no       Drug use-no       Regular exercise-no   Risk Factors:  Tobacco use:  quit Drug use:  no Alcohol use:  no Exercise:  no    Physical Exam  General:     alert.   Head:     normocephalic.   Ears:     R ear normal and L ear normal.   Neck:     No deformities, masses, or tenderness noted. Lungs:     normal respiratory effort and no wheezes.   Heart:     normal rate and regular rhythm.   Abdomen:     soft, distended, and epigastric tenderness.   Msk:     no joint swelling and no joint warmth.   Neurologic:     alert & oriented X3, gait normal, and DTRs symmetrical and normal.      Impression & Recommendations:  Problem # 1:  COUGH, CHRONIC (ICD-786.2) tusionex heled and the cough stopped for 2-3 months  Problem # 2:  GERD (ICD-530.81)  Her updated medication list for this problem includes:    Prevacid 30 Mg Cpdr (Lansoprazole) .Marland KitchenMarland KitchenMarland KitchenMarland Kitchen  Take 1 tablet by mouth once a day  Diagnostics Reviewed:  Discussed lifestyle modifications, diet, antacids/medications, and preventive measures. Handout provided.   Problem # 3:  HYPERTENSION (ICD-401.9)  Her updated medication list for this problem includes:    Calan Sr 240 Mg Tbcr (Verapamil hcl) .Marland Kitchen... Take 1 tablet by mouth once a day    Benicar Hct 40-25 Mg Tabs (Olmesartan medoxomil-hctz) ..... Once daily  BP today: 152/74  Labs Reviewed: Creat: 1.6 (05/20/2006)   Complete Medication List: 1)  Calan Sr 240 Mg Tbcr (Verapamil hcl) .... Take 1 tablet by mouth once a day 2)  K-dur  .... Two times a day 3)  Drysol Soln (Aluminum chloride soln) .... As needed 4)  Prevacid 30 Mg Cpdr (Lansoprazole) .... Take 1 tablet by mouth once a day 5)  Daily Multi-vitamins/iron Tabs (Multiple vitamins-iron) .... Once daily 6)  Questran 4 Gm Pack (Cholestyramine) .... Before meals and at bedtiome 7)  Benicar Hct 40-25 Mg Tabs (Olmesartan medoxomil-hctz) .... Once daily 8)  Nabumetone 500 Mg Tabs (Nabumetone) .... Take 1  tablet by mouth two times a day 9)  Cymbalta 60 Mg Cpep (Duloxetine hcl) .... Once daily 10)  Darvocet-n 100 Tabs (Propoxyphene n-apap tabs) .... As needed 11)  Nasonex Susp (Mometasone furoate susp) .... As needed 12)  Vicodin Tabs (Hydrocodone-acetaminophen tabs) .... As needed 13)  Allegra-d 12 Hour Tb12 (Fexofenadine-pseudoephedrine tb12) .... As needed 14)  Vesicare 5 Mg Tabs (Solifenacin succinate) .... Once daily 15)  Tussionex Pennkinetic Er 8-10 Mg/4ml Lqcr (Chlorpheniramine-hydrocodone) .... One tsp by mouth two times a day for 30 days  Hypertension Assessment/Plan:      The patient's hypertensive risk group is category B: At least one risk factor (excluding diabetes) with no target organ damage.  Today's blood pressure is 140/74.  Her blood pressure goal is < 140/90.   Patient Instructions: 1)  Please schedule a follow-up appointment in 3 months.    Prescriptions: Sandria Senter ER 8-10 MG/5ML  LQCR (CHLORPHENIRAMINE-HYDROCODONE) one tsp by mouth two times a day for 30 days  #12 oz x 1   Entered and Authorized by:   Stacie Glaze MD   Signed by:   Stacie Glaze MD on 03/12/2007   Method used:   Print then Give to Patient   RxID:   [id number]  ]

## 2010-07-24 NOTE — Assessment & Plan Note (Signed)
Summary: 3 MTH ROV // RS/PT RSC/CJR   Vital Signs:  Patient profile:   77 year old female Height:      66 inches Weight:      194 pounds BMI:     31.43 Temp:     98.2 degrees F oral Pulse rate:   68 / minute Resp:     14 per minute BP sitting:   140 / 80  (left arm)  Vitals Entered By: Willy Eddy, LPN (October 20, 2009 11:58 AM) CC: roa, Hypertension Management   Primary Care Provider:  Peri Jefferson  CC:  roa and Hypertension Management.  History of Present Illness: Increased pain in the right knee and planned arthroscopy folow up of anemia and HTN as well as hx of chronic cough  Hypertension History:      She denies headache, chest pain, palpitations, dyspnea with exertion, orthopnea, PND, peripheral edema, visual symptoms, neurologic problems, syncope, and side effects from treatment.        Positive major cardiovascular risk factors include female age 97 years old or older and hypertension.  Negative major cardiovascular risk factors include non-tobacco-user status.     Preventive Screening-Counseling & Management  Alcohol-Tobacco     Smoking Status: quit     Year Started: 1980     Year Quit: 1990  Problems Prior to Update: 1)  Polydipsia  (ICD-783.5) 2)  Nocturia  (ICD-788.43) 3)  Loc Osteoarthros Not Spec Prim/sec [employer] Region  (ICD-715.31) 4)  Anemia  (ICD-285.9) 5)  Hx of Personal History of Peptic Ulcer Disease  (ICD-V12.71) 6)  Iron 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  (ICD-280.0) 7)  Insomnia  (ICD-780.52) 8)  Sciatica, Right  (ICD-724.3) 9)  Pud  (ICD-533.90) 10)  Esophagitis, Hx of  (ICD-V12.79) 11)  Hiatal Hernia  (ICD-553.3) 12)  Diverticulosis of Colon  (ICD-562.10) 13)  Low Back Pain  (ICD-724.2) 14)  Esophageal Motility Disorder  (ICD-530.5) 15)  Constipation, Chronic  (ICD-564.09) 16)  Hyperglycemia, Borderline  (ICD-790.29) 17)  Cough, Chronic  (ICD-786.2) 18)  Esophageal Stricture  (ICD-530.3) 19)  Hypertension   (ICD-401.9) 20)  Gerd  (ICD-530.81)  Current Problems (verified): 1)  Polydipsia  (ICD-783.5) 2)  Nocturia  (ICD-788.43) 3)  Loc Osteoarthros Not Spec Prim/sec [place] Region  (ICD-715.31) 4)  Anemia  (ICD-285.9) 5)  Hx of Personal History of Peptic Ulcer Disease  (ICD-V12.71) 6)  Iron 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  (ICD-280.0) 7)  Insomnia  (ICD-780.52) 8)  Sciatica, Right  (ICD-724.3) 9)  Pud  (ICD-533.90) 10)  Esophagitis, Hx of  (ICD-V12.79) 11)  Hiatal Hernia  (ICD-553.3) 12)  Diverticulosis of Colon  (ICD-562.10) 13)  Low Back Pain  (ICD-724.2) 14)  Esophageal Motility Disorder  (ICD-530.5) 15)  Constipation, Chronic  (ICD-564.09) 16)  Hyperglycemia, Borderline  (ICD-790.29) 17)  Cough, Chronic  (ICD-786.2) 18)  Esophageal Stricture  (ICD-530.3) 19)  Hypertension  (ICD-401.9) 20)  Gerd  (ICD-530.81)  Medications Prior to Update: 1)  Calan Sr 240 Mg  Tbcr (Verapamil Hcl) .... Once Daily 2)  Daily Multi-Vitamins/iron   Tabs (Multiple Vitamins-Iron) .... Once Daily 3)  Benicar Hct 40-25 Mg  Tabs (Olmesartan Medoxomil-Hctz) .... Once Daily 4)  Hydrocodone-Acetaminophen 5-325 Mg Tabs (Hydrocodone-Acetaminophen) .... One By Mouth Three Times A Day As Needed 5)  Desoximetasone 0.25 %  Crea (Desoximetasone) .... Apply To Rash Prn 6)  Amitriptyline Hcl 25 Mg  Tabs (Amitriptyline Hcl) .Marland Kitchen.. 1 Once Daily 7)  Hypercare 20 % Soln (Aluminum Chloride) .Marland KitchenMarland KitchenMarland Kitchen  Apply Inder Arms Once Daily 8)  Klor-Con M20 20 Meq Cr-Tabs (Potassium Chloride Crys Cr) .... One By Mouth Daily 9)  Nexium 40 Mg Cpdr (Esomeprazole Magnesium) .... One By Mouth Daily 10)  Ferrous Sulfate 324 Mg Tbec (Ferrous Sulfate) .... One Twice A Day With Food 11)  Etodolac 300 Mg Caps (Etodolac) .... One By Mouth Bid 12)  Cymbalta 60 Mg Cpep (Duloxetine Hcl) .... One By Mouth  Each Day 13)  Allegra 180 Mg Tabs (Fexofenadine Hcl) .... Take 1 Tablet By Mouth Once A Day As Needed Allergies 14)  Toviaz 8 Mg Xr24h-Tab  (Fesoterodine Fumarate) .... One By Mouth Daily 15)  Tussionex Pennkinetic Er 8-10 Mg/61ml Lqcr (Chlorpheniramine-Hydrocodone) .... One Teaspoon Q 12 Hours As Needed Cough. 16)  Zithromax Z-Pak 250 Mg Tabs (Azithromycin) .... As Directed  Current Medications (verified): 1)  Calan Sr 240 Mg  Tbcr (Verapamil Hcl) .... Once Daily 2)  Daily Multi-Vitamins/iron   Tabs (Multiple Vitamins-Iron) .... Once Daily 3)  Benicar Hct 40-25 Mg  Tabs (Olmesartan Medoxomil-Hctz) .... Once Daily 4)  Hydrocodone-Acetaminophen 5-325 Mg Tabs (Hydrocodone-Acetaminophen) .... One By Mouth Three Times A Day As Needed 5)  Desoximetasone 0.25 %  Crea (Desoximetasone) .... Apply To Rash Prn 6)  Amitriptyline Hcl 25 Mg  Tabs (Amitriptyline Hcl) .Marland Kitchen.. 1 Once Daily 7)  Hypercare 20 % Soln (Aluminum Chloride) .... Apply Inder Arms Once Daily 8)  Klor-Con M20 20 Meq Cr-Tabs (Potassium Chloride Crys Cr) .... One By Mouth Daily 9)  Nexium 40 Mg Cpdr (Esomeprazole Magnesium) .... One By Mouth Daily 10)  Ferrous Sulfate 324 Mg Tbec (Ferrous Sulfate) .... One Twice A Day With Food 11)  Etodolac 300 Mg Caps (Etodolac) .... One By Mouth Bid 12)  Cymbalta 60 Mg Cpep (Duloxetine Hcl) .... One By Mouth  Each Day 13)  Allegra 180 Mg Tabs (Fexofenadine Hcl) .... Take 1 Tablet By Mouth Once A Day As Needed Allergies 14)  Toviaz 8 Mg Xr24h-Tab (Fesoterodine Fumarate) .... One By Mouth Daily  Allergies (verified): No Known Drug Allergies  Past History:  Family History: Last updated: 03/10/2008 Family History Diabetes 1st degree relative Family History of Heart Disease:   Social History: Last updated: 03/14/2008 Occupation: Conservation officer, nature Former Smoker Alcohol use-no Drug use-no Regular exercise-no  Risk Factors: Exercise: no (03/12/2007)  Risk Factors: Smoking Status: quit (10/20/2009)  Past medical, surgical, family and social histories (including risk factors) reviewed, and no changes noted (except as noted below).  Past  Medical History: Reviewed history from 09/19/2008 and no changes required. Menopause Low back pain Current Problems:  PUD (ICD-533.90) ESOPHAGITIS, HX OF (ICD-V12.79) HIATAL HERNIA (ICD-553.3) DIVERTICULOSIS OF COLON (ICD-562.10) LOW BACK PAIN (ICD-724.2) ESOPHAGEAL MOTILITY DISORDER (ICD-530.5) CONSTIPATION, CHRONIC (ICD-564.09) HYPERGLYCEMIA, BORDERLINE (ICD-790.29) COUGH, CHRONIC (ICD-786.2) ESOPHAGEAL STRICTURE (ICD-530.3) HYPERTENSION (ICD-401.9) GERD (ICD-530.81) Gastroparesis  Past Surgical History: Reviewed history from 12/09/2008 and no changes required. Hysterectomy Rotator cuff repair graves Oophorectomy cataracts on left and right ( T [facility])  Family History: Reviewed history from 03/10/2008 and no changes required. Family History Diabetes 1st degree relative Family History of Heart Disease:   Social History: Reviewed history from 03/14/2008 and no changes required. Occupation: Conservation officer, nature Former Smoker Alcohol use-no Drug use-no Regular exercise-no  Review of Systems  The patient denies anorexia, fever, weight loss, weight gain, vision loss, decreased hearing, hoarseness, chest pain, syncope, dyspnea on exertion, peripheral edema, prolonged cough, headaches, hemoptysis, abdominal pain, melena, hematochezia, severe indigestion/heartburn, hematuria, incontinence, genital sores, muscle weakness, suspicious skin lesions, transient blindness, difficulty walking,  depression, unusual weight change, abnormal bleeding, enlarged lymph nodes, angioedema, and breast masses.    Physical Exam  General:  Well developed, well nourished, no acute distress. Head:  Normocephalic and atraumatic. Ears:  R ear normal and L ear normal.   Nose:  nasal dischargemucosal pallor and mucosal erythema.   Mouth:  No oral lesions. Tongue moist.  Neck:  Supple; no masses. Lungs:  Clear throughout to auscultation.no crackles and no wheezes.   Heart:  normal rate and regular rhythm.    Abdomen:  soft and non-tender.     Impression & Recommendations:  Problem # 1:  HIATAL HERNIA (ICD-553.3)  Her updated medication list for this problem includes:    Nexium 40 Mg Cpdr (Esomeprazole magnesium) ..... One by mouth daily  EGD: Location: [facility]  1) Ring, esophageal in the distal esophagus (non-obstructive) 2) 2 cm hiatal hernia 3) Abnormal mucosa at the gastroesophageal junction (MILD GERD CHANGES) 4) 8 mm ulcer in the antrum (BX BENIGN INFLAMMATION?FIBROSIS) 5) Erosions, multiple in the antrum 6) Otherwise normal examination 7) She reported new vaginal bleeding today (09/20/2008)  Labs Reviewed: Hgb: 11.3 (07/21/2009)   Hct: 34.5 (07/21/2009)  Problem # 2:  HYPERTENSION (ICD-401.9)  Her updated medication list for this problem includes:    Calan Sr 240 Mg Tbcr (Verapamil hcl) ..... Once daily    Benicar Hct 40-25 Mg Tabs (Olmesartan medoxomil-hctz) ..... Once daily  BP today: 140/80 Prior BP: 140/80 (07/21/2009)  Prior 10 Yr Risk Heart Disease: Not enough information (05/05/2007)  Labs Reviewed: K+: 3.7 (07/21/2009) Creat: : 1.1 (07/21/2009)     Problem # 3:  COUGH, CHRONIC (ICD-786.2) due to allergies the medicaton cough has resolved  Problem # 4:  IRON DEFICIENCY ANEMIA SECONDARY TO BLOOD LOSS (ICD-280.0)  Her updated medication list for this problem includes:    Ferrous Sulfate 324 Mg Tbec (Ferrous sulfate) ..... One twice a day with food  Hgb: 11.3 (07/21/2009)   Hct: 34.5 (07/21/2009)   Platelets: 330.0 (07/21/2009) RBC: 3.73 (07/21/2009)   RDW: 14.2 (07/21/2009)   WBC: 5.4 (07/21/2009) MCV: 92.5 (07/21/2009)   MCHC: 32.8 (07/21/2009) Ferritin: 181.5 (09/19/2008) Iron: 69 (07/21/2009)   % Sat: 22.1 (07/21/2009) B12: 427 (09/16/2008)   Folate: 9.0 (09/16/2008)     Complete Medication List: 1)  Calan Sr 240 Mg Tbcr (Verapamil hcl) .... Once daily 2)  Daily Multi-vitamins/iron Tabs (Multiple vitamins-iron) .... Once daily 3)   Benicar Hct 40-25 Mg Tabs (Olmesartan medoxomil-hctz) .... Once daily 4)  Hydrocodone-acetaminophen 5-325 Mg Tabs (Hydrocodone-acetaminophen) .... One by mouth three times a day as needed 5)  Desoximetasone 0.25 % Crea (Desoximetasone) .... Apply to rash prn 6)  Amitriptyline Hcl 25 Mg Tabs (Amitriptyline hcl) .Marland Kitchen.. 1 once daily 7)  Hypercare 20 % Soln (Aluminum chloride) .... Apply inder arms once daily 8)  Klor-con M20 20 Meq Cr-tabs (Potassium chloride crys cr) .... One by mouth daily 9)  Nexium 40 Mg Cpdr (Esomeprazole magnesium) .... One by mouth daily 10)  Ferrous Sulfate 324 Mg Tbec (Ferrous sulfate) .... One twice a day with food 11)  Etodolac 300 Mg Caps (Etodolac) .... One by mouth bid 12)  Cymbalta 60 Mg Cpep (Duloxetine hcl) .... One by mouth  each day 13)  Allegra 180 Mg Tabs (Fexofenadine hcl) .... Take 1 tablet by mouth once a day as needed allergies 14)  Toviaz 8 Mg Xr24h-tab (Fesoterodine fumarate) .... One by mouth daily  Hypertension Assessment/Plan:      The patient's hypertensive risk  group is category B: At least one risk factor (excluding diabetes) with no target organ damage.  Today's blood pressure is 140/80.  Her blood pressure goal is < 140/90.  Patient Instructions: 1)  Please schedule a follow-up appointment in 3 months. Prescriptions: NEXIUM 40 MG CPDR (ESOMEPRAZOLE MAGNESIUM) one by mouth daily  #30 x 6   Entered and Authorized by:   Stacie Glaze MD   Signed by:   Stacie Glaze MD on 10/20/2009   Method used:   [facility]        [facility] [address] [phone]* (retail)       [address]       [place], [place]  [address]       Ph: [phone] or [phone]       Fax: [phone]   RxID:   [id number]

## 2010-07-24 NOTE — Letter (Signed)
Summary: Out of Work  [facility]  [address] [place], [place] [address]   Phone: [phone]  Fax: [phone]    February 21, 2010   Employee:  ADALYNN CORNE    To Whom It May Concern:   For Medical reasons, please excuse the above named employee from work for the following dates:    02-20-10 Tuesday , 02-21-10 Wednesday   The patient can return to work on Thursday 02-22-10.   If you need additional information, please feel free to contact our office.         Sincerely,      Amy Estserwood, PA-C

## 2010-07-24 NOTE — Letter (Signed)
Summary: [facility]  [facility]   Imported By: Maryln Gottron 08/15/2009 10:45:19  _____________________________________________________________________  External Attachment:    Type:   Image     Comment:   External Document

## 2010-07-24 NOTE — Progress Notes (Signed)
  Phone Note Call from Patient   Caller: Daughter Call For: Stacie Glaze MD Reason for Call: Acute Illness Summary of Call: Daughter is asking for appt due to weakness, and no energy.  Feels anemic again.  Daughter does not know of any other symptoms. [phone] Initial call taken by: Lynann Beaver CMA,  January 31, 2010 10:38 AM  Follow-up for Phone Call        dr Lovell Sheehan saw pt on 8-4 - she may have cbc now Follow-up by: Willy Eddy, LPN,  January 31, 2010 10:55 AM  Additional Follow-up for Phone Call Additional follow up Details #1::        Pt will come in today for CBC. Additional Follow-up by: Lynann Beaver CMA,  January 31, 2010 11:04 AM

## 2010-07-24 NOTE — Progress Notes (Signed)
Summary: samples   Phone Note Call from Patient Call back at Home Phone [phone]   Caller: Patient Call For: [name] Reason for Call: Talk to Nurse Summary of Call: Patient wants to know if she can get some samples of Nexium Initial call taken by: Tawni Levy,  July 21, 2009 11:55 AM  Follow-up for Phone Call        Patient was advised that Dr Lovell Sheehan office just sent refills on her Nexium today. She was unaware and will pick up that rx. I have advised patient that she will soon need an appointment with Dr [name]. It has been almost 1 year since we saw her and she needs to have her anemia and antral ulcer f/u on. Follow-up by: Hortense Ramal CMA Duncan Dull),  July 21, 2009 2:03 PM

## 2010-07-24 NOTE — Progress Notes (Signed)
Summary: cough Rx  Phone Note Call from Patient   [facility] Pt needs cough meds refilled for reflux cough. [phone] Initial call taken by: Lynann Beaver CMA,  November 28, 2009 3:14 PM    New/Updated Medications: Sandria Senter ER 8-10 MG/5ML LQCR (CHLORPHENIRAMINE-HYDROCODONE) 1 tsp every 12 hours as needed cough Prescriptions: TUSSIONEX PENNKINETIC ER 8-10 MG/5ML LQCR (CHLORPHENIRAMINE-HYDROCODONE) 1 tsp every 12 hours as needed cough  #6oz x 1   Entered by:   Willy Eddy, LPN   Authorized by:   Stacie Glaze MD   Signed by:   Willy Eddy, LPN on 11/91/4782   Method used:   Telephoned to ...       [facility] [address] [phone]* (retail)       [address]       [place], [place]  [address]       Ph: [phone] or [phone]       Fax: [phone]   RxID:   [id number]  pt informed  Appended Document: cough Rx Tussionex is too expensive.  Would like something less expensive.

## 2010-07-24 NOTE — Assessment & Plan Note (Signed)
Summary: lower abdominal pain/sheri   History of Present Illness Visit Type: Follow-up Visit Primary GI MD: Lina Sar MD Primary Provider: Darryll Capers MD Requesting Provider: n/a Chief Complaint: Lower abdominal pain History of Present Illness:   VERY NICE 78 YO FEMALE KNOWN TO DR. Juanda Chance WITH HX OF PUD. SHE HAD COLONOSCOPY IN 2008 PERTINENT FOR DIVERTICULOSIS. EGD LAST 3/10 SHOWING SMALL H.H, ANTRAL ULCER, AND MULTIPLE ANTRAL EROSIONS. BX'S BENIGN AND NEGATIVE FOR H.PYLORI. SHE HAS BEEN ON NEXIUM 40 MD DAILY CHRONICALLY. COMES IN WITH UPPER ABDOMINAL PAIN OVER THE PAST 3 DAYS. THIS HAS BEEN ASSOCIATED WITH NAUSEA AND VOMITING. NO FEVER/CHILLS. NO DIARHEA, MELENA ETC. SHE HAS FELT BLAOTED, GASSY, ACHING IN UPPER ABDOMEN. TUMS SEEMS TO HELP A BIT. HAD TO LEAVE WORK YESTERDAY DUE TO N/V. KEEPING LIQUIDS DOWN. SHE HAD BEEN ON ETODALAC RECENTLY STOPPED, LOTS OF ARTHRITIS ISSUES.   GI Review of Systems    Reports abdominal pain, bloating, nausea, and  vomiting.     Location of  Abdominal pain: epigastric area.    Denies acid reflux, belching, chest pain, dysphagia with liquids, dysphagia with solids, heartburn, loss of appetite, vomiting blood, weight loss, and  weight gain.      Reports constipation.     Denies anal fissure, black tarry stools, change in bowel habit, diarrhea, diverticulosis, fecal incontinence, heme positive stool, hemorrhoids, irritable bowel syndrome, jaundice, light color stool, liver problems, rectal bleeding, and  rectal pain.   Current Medications (verified): 1)  Calan Sr 240 Mg  Tbcr (Verapamil Hcl) .... Once Daily 2)  Daily Multi-Vitamins/iron   Tabs (Multiple Vitamins-Iron) .... Once Daily 3)  Benicar Hct 40-25 Mg  Tabs (Olmesartan Medoxomil-Hctz) .... Once Daily 4)  Hydrocodone-Acetaminophen 5-325 Mg Tabs (Hydrocodone-Acetaminophen) .... One By Mouth Three Times A Day As Needed 5)  Desoximetasone 0.25 %  Crea (Desoximetasone) .... Apply To Rash Prn 6)   Amitriptyline Hcl 25 Mg  Tabs (Amitriptyline Hcl) .Marland Kitchen.. 1 Once Daily 7)  Hypercare 20 % Soln (Aluminum Chloride) .... Apply Inder Arms Once Daily 8)  Klor-Con M20 20 Meq Cr-Tabs (Potassium Chloride Crys Cr) .... One By Mouth Daily 9)  Nexium 40 Mg Cpdr (Esomeprazole Magnesium) .... One By Mouth Daily 10)  Ferrous Sulfate 324 Mg Tbec (Ferrous Sulfate) .... One Twice A Day With Food 11)  Etodolac 300 Mg Caps (Etodolac) .... One By Mouth Bid 12)  Cymbalta 60 Mg Cpep (Duloxetine Hcl) .... One By Mouth  Each Day 13)  Allegra 180 Mg Tabs (Fexofenadine Hcl) .... Take 1 Tablet By Mouth Once A Day As Needed Allergies 14)  Toviaz 8 Mg Xr24h-Tab (Fesoterodine Fumarate) .... One By Mouth Daily 15)  Tussionex Pennkinetic Er 8-10 Mg/63ml Lqcr (Chlorpheniramine-Hydrocodone) .Marland Kitchen.. 1 Tsp Every 12 Hours As Needed Cough 16)  Miralax  Powd (Polyethylene Glycol 3350) .Marland KitchenMarland KitchenMarland Kitchen 17 Gms A Day in Water 17)  Cranberry Fruit 405 Mg Caps (Cranberry) .... 2000mg  Per Day 18)  Allegra 180 Mg Tabs (Fexofenadine Hcl) .Marland Kitchen.. 1 By Mouth Once Daily  Allergies (verified): No Known Drug Allergies  Past History:  Past Surgical History: Last updated: 12/09/2008 Hysterectomy Rotator cuff repair graves Oophorectomy cataracts on left and right ( T [facility]ington)  Past Medical History: Menopause Low back pain PUD (ICD-533.90) ESOPHAGITIS, HX OF (ICD-V12.79) DIVERTICULOSIS OF COLON (ICD-562.10) LOW BACK PAIN (ICD-724.2) ESOPHAGEAL MOTILITY DISORDER (ICD-530.5) CONSTIPATION, CHRONIC (ICD-564.09) HYPERGLYCEMIA, BORDERLINE (ICD-790.29) COUGH, CHRONIC (ICD-786.2) ESOPHAGEAL STRICTURE (ICD-530.3) HYPERTENSION (ICD-401.9) GERD (ICD-530.81)  Family History: Family History Diabetes 1st degree relative Family History of  Heart Disease:  No FH of Colon Cancer:  Social History: Reviewed history from 03/14/2008 and no changes required. Occupation: Conservation officer, nature Former Smoker Alcohol use-no Drug use-no Regular exercise-no  Review of  Systems       The patient complains of arthritis/joint pain, back pain, and sleeping problems.  The patient denies allergy/sinus, anemia, anxiety-new, blood in urine, breast changes/lumps, change in vision, confusion, cough, coughing up blood, depression-new, fainting, fatigue, fever, headaches-new, hearing problems, heart murmur, heart rhythm changes, itching, menstrual pain, muscle pains/cramps, night sweats, nosebleeds, pregnancy symptoms, shortness of breath, skin rash, sore throat, swelling of feet/legs, swollen lymph glands, thirst - excessive , urination - excessive , urination changes/pain, urine leakage, vision changes, and voice change.         SEE HPI  Vital Signs:  Patient profile:   77 year old female Height:      66 inches Weight:      192 pounds BMI:     31.10 BSA:     1.97 Pulse rate:   80 / minute Pulse rhythm:   regular BP sitting:   100 / 60  (left arm)  Vitals Entered By: Merri Ray CMA (AAMA) (February 21, 2010 10:10 AM)  Physical Exam  General:  Well developed, well nourished, no acute distress. Head:  Normocephalic and atraumatic. Eyes:  PERRLA, no icterus. Lungs:  Clear throughout to auscultation. Heart:  Regular rate and rhythm; no murmurs, rubs,  or bruits. Abdomen:  SOFT, TENDER, EPIGASTRIUM, NO GUARDING,OR REBOUND, NO MASS OR HSM,BS+ Rectal:  NOT DONE Extremities:  No clubbing, cyanosis, edema or deformities noted. Neurologic:  Alert and  oriented x4;  grossly normal neurologically. Psych:  Alert and cooperative. Normal mood and affect.  Impression & Recommendations:  Problem # 1:  ABDOMINAL PAIN, EPIGASTRIC (ICD-789.06) Assessment New 78 YO FEMALE WITH 3 DAY HX OF EPIGASTRIC PAIN, NAUSEA AND VOMITING. HX OF NSAID INDUCED PUD. R/O RECURRENT NSAID INDUCED GASTRITIS/PUD, R/O GB DISEASE. SCHEDULE FOR UPPER ABDOMINAL US INCREASE NEXIUM TO 40 MG TWICE DAILY X ONE MONTH NO MORE NSAIDS! ADD CARAFATE SLURRY 1 GM  BETWEEN MEALS AND BEDTIME X 7-10  DAYS SOFT, BLAND DIET LABS AS BELOW FOLLOW UP IN OFFICE IN 2 WEEKS WITH DR. Juanda Chance OR MYSELF-IF NO BETTER THEN EGD.  TLB-CMP (Comprehensive Metabolic Pnl) (80053-COMP) TLB-CBC Platelet - w/Differential (85025-CBCD) TLB-Lipase (83690-LIPASE)  Problem # 2:  OSTEOARTHRITIS (ICD-715.9) Assessment: Comment Only  Problem # 3:  DIVERTICULOSIS OF COLON (ICD-562.10) Assessment: Comment Only  Problem # 4:  HYPERTENSION (ICD-401.9) Assessment: Comment Only  Patient Instructions: 1)  Please go to lab, basement level. 2)  We scheduled the [place] at [facility] on 02-23-10 Friday. 3)  Directions given. 4)  We have given you Nexium samples to take twice daily for 1 month. 5)  We prescribed the Carafate liquid, take as directed. [facility]. 6)  Copy sent to : Darryll Capers, MD 7)  The medication list was reviewed and reconciled.  All changed / newly prescribed medications were explained.  A complete medication list was provided to the patient / caregiver.  Prescriptions: CARAFATE 1 GM/10ML SUSP (SUCRALFATE) Take 1 gram between meals and at bedtime  #120 gram x 1   Entered by:   Lowry Ram NCMA   Authorized by:   Sammuel Cooper PA-c   Signed by:   Lowry Ram NCMA on 02/21/2010   Method used:   [facility]ronically to        [facility] [address] [phone]* (retail)       [address]       [place], [place]  [address]       Ph: [phone] or [phone]       Fax: [phone]   RxID:   [id number]

## 2010-07-24 NOTE — Progress Notes (Signed)
  Phone Note Call from Patient   Summary of Call: pt calling requesting stronger dose of hydrocodone than 325.  states her arthritis is really hurting her and she feels this is not helping Initial call taken by: Willy Eddy, LPN,  March 27, 2010 3:35 PM  Follow-up for Phone Call        change to the 5/500 and see if this helps Follow-up by: Stacie Glaze MD,  March 27, 2010 8:54 PM    New/Updated Medications: HYDROCODONE-ACETAMINOPHEN 5-500 MG TABS (HYDROCODONE-ACETAMINOPHEN)  HYDROCODONE-ACETAMINOPHEN 5-500 MG TABS (HYDROCODONE-ACETAMINOPHEN) 1every 4-6 hours as needed pain Prescriptions: HYDROCODONE-ACETAMINOPHEN 5-500 MG TABS (HYDROCODONE-ACETAMINOPHEN) 1every 4-6 hours as needed pain  #50 x 1   Entered by:   Willy Eddy, LPN   Authorized by:   Stacie Glaze MD   Signed by:   Willy Eddy, LPN on 43/32/9518   Method used:   Telephoned to ...       [facility] [address] [phone]* (retail)       [address]       [place], [place]  [address]       Ph: [phone] or [phone]       Fax: [phone]   RxID:   [id number]

## 2010-07-24 NOTE — Progress Notes (Signed)
Summary: triage   Phone Note Call from Patient Call back at [phone]   Caller: Daughter Robin Call For: Dr Juanda Chance Reason for Call: Talk to Nurse Summary of Call: Daughter would like her mother seen today for severe abd pain that can't wait until her appt day 9-30. States that she called Dr Derry Skill and was told to call us so that she can be worked in. Initial call taken by: Tawni Levy,  February 20, 2010 8:32 AM  Follow-up for Phone Call        Patient's daughter called to report her mother is having lower abdominal pain and Dr Cecille Aver office advised them to call here.  Patient  is scheduled for an appointment with Mike Gip PA tomorrow at 10:00 Follow-up by: Darcey Nora RN, CGRN,  February 20, 2010 8:46 AM

## 2010-07-24 NOTE — Progress Notes (Signed)
Summary: pain meds  Phone Note Call from Patient   Caller: Patient Call For: Stacie Glaze MD Summary of Call: Pt is asking for a refill on pain meds, and to schedule an injection in back at [facility].  [phone]  [facility]) Initial call taken by: Lynann Beaver CMA,  December 26, 2009 10:19 AM  Follow-up for Phone Call        may refill hydrocodone and refer to interventional rad for back injection Follow-up by: Stacie Glaze MD,  December 26, 2009 12:07 PM    Prescriptions: HYDROCODONE-ACETAMINOPHEN 5-325 MG TABS (HYDROCODONE-ACETAMINOPHEN) ONE by mouth three times a day as needed  #60 x 3   Entered by:   Lynann Beaver CMA   Authorized by:   Stacie Glaze MD   Signed by:   Lynann Beaver CMA on 12/26/2009   Method used:   Telephoned to ...       [facility] [address] [phone]* (retail)       [address]       [place], [place]  [address]       Ph: [phone] or [phone]       Fax: [phone]   RxID:   [id number]

## 2010-07-24 NOTE — Assessment & Plan Note (Signed)
Summary: ELEVATED BUN AND CREATININE/PER [facility]Y [facility]ER[facility] AT [facility]   Vital Signs:  Patient profile:   77 year old female Height:      66 inches Weight:      198 pounds BMI:     32.07 Temp:     98.2 degrees F oral Pulse rate:   92 / minute Resp:     14 per minute BP sitting:   150 / 80  (left arm)  Vitals Entered By: Willy Eddy, LPN (March 06, 2010 9:37 AM) CC: roa- from gi elevated BUN and creatinine- holding benicar since last thursday , Hypertension Management Is Patient Diabetic? No   Primary Care Provider:  Darryll Capers MD  CC:  roa- from gi elevated BUN and creatinine- holding benicar since last thursday  and Hypertension Management.  History of Present Illness: follow up fro HTN with elevations of BUN and creatinine with risk fro renal dz pt is asymptomatic she reports no increased swelling in extremities no SOB complient with medicatiosn and no HA, chest pressure or signs of uncontroilled blood pressure  Hypertension History:      She denies headache, chest pain, palpitations, dyspnea with exertion, orthopnea, PND, peripheral edema, visual symptoms, neurologic problems, syncope, and side effects from treatment.        Positive major cardiovascular risk factors include female age 6 years old or older and hypertension.  Negative major cardiovascular risk factors include non-tobacco-user status.     Preventive Screening-Counseling & Management  Alcohol-Tobacco     Smoking Status: quit     Year Started: 1980     Year Quit: 1990  Problems Prior to Update: 1)  Renal Cyst  (ICD-593.2) 2)  Constipation  (ICD-564.00) 3)  Osteoarthritis  (ICD-715.9) 4)  Nausea and Vomiting  (ICD-787.01) 5)  Ulcer-gastric  (ICD-531.9) 6)  Nausea Alone  (ICD-787.02) 7)  Abdominal Pain, Epigastric  (ICD-789.06) 8)  Polydipsia  (ICD-783.5) 9)  Nocturia  (ICD-788.43) 10)  Loc Osteoarthros Not Spec Prim/sec [place] Region  (ICD-715.31) 11)  Anemia  (ICD-285.9) 12)  Hx of Personal  History of Peptic Ulcer Disease  (ICD-V12.71) 13)  Iron 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  (ICD-280.0) 14)  Insomnia  (ICD-780.52) 15)  Sciatica, Right  (ICD-724.3) 16)  Pud  (ICD-533.90) 17)  Esophagitis, Hx of  (ICD-V12.79) 18)  Hiatal Hernia  (ICD-553.3) 19)  Diverticulosis of Colon  (ICD-562.10) 20)  Low Back Pain  (ICD-724.2) 21)  Esophageal Motility Disorder  (ICD-530.5) 22)  Constipation, Chronic  (ICD-564.09) 23)  Hyperglycemia, Borderline  (ICD-790.29) 24)  Cough, Chronic  (ICD-786.2) 25)  Esophageal Stricture  (ICD-530.3) 26)  Hypertension  (ICD-401.9) 27)  Gerd  (ICD-530.81)  Current Problems (verified): 1)  Renal Cyst  (ICD-593.2) 2)  Constipation  (ICD-564.00) 3)  Osteoarthritis  (ICD-715.9) 4)  Nausea and Vomiting  (ICD-787.01) 5)  Ulcer-gastric  (ICD-531.9) 6)  Nausea Alone  (ICD-787.02) 7)  Abdominal Pain, Epigastric  (ICD-789.06) 8)  Polydipsia  (ICD-783.5) 9)  Nocturia  (ICD-788.43) 10)  Loc Osteoarthros Not Spec Prim/sec [place] Region  (ICD-715.31) 11)  Anemia  (ICD-285.9) 12)  Hx of Personal History of Peptic Ulcer Disease  (ICD-V12.71) 13)  Iron 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  (ICD-280.0) 14)  Insomnia  (ICD-780.52) 15)  Sciatica, Right  (ICD-724.3) 16)  Pud  (ICD-533.90) 17)  Esophagitis, Hx of  (ICD-V12.79) 18)  Hiatal Hernia  (ICD-553.3) 19)  Diverticulosis of Colon  (ICD-562.10) 20)  Low Back Pain  (ICD-724.2) 21)  Esophageal Motility Disorder  (  ICD-530.5) 22)  Constipation, Chronic  (ICD-564.09) 23)  Hyperglycemia, Borderline  (ICD-790.29) 24)  Cough, Chronic  (ICD-786.2) 25)  Esophageal Stricture  (ICD-530.3) 26)  Hypertension  (ICD-401.9) 27)  Gerd  (ICD-530.81)  Medications Prior to Update: 1)  Calan Sr 240 Mg  Tbcr (Verapamil Hcl) .... Once Daily 2)  Daily Multi-Vitamins/iron   Tabs (Multiple Vitamins-Iron) .... Once Daily 3)  Benicar Hct 40-25 Mg  Tabs (Olmesartan Medoxomil-Hctz) .... Once Daily 4)   Hydrocodone-Acetaminophen 5-325 Mg Tabs (Hydrocodone-Acetaminophen) .... One By Mouth Three Times A Day As Needed 5)  Desoximetasone 0.25 %  Crea (Desoximetasone) .... Apply To Rash Prn 6)  Amitriptyline Hcl 25 Mg  Tabs (Amitriptyline Hcl) .Marland Kitchen.. 1 Once Daily 7)  Hypercare 20 % Soln (Aluminum Chloride) .... Apply Inder Arms Once Daily 8)  Klor-Con M20 20 Meq Cr-Tabs (Potassium Chloride Crys Cr) .... One By Mouth Daily 9)  Nexium 40 Mg Cpdr (Esomeprazole Magnesium) .... One By Mouth Daily 10)  Ferrous Sulfate 324 Mg Tbec (Ferrous Sulfate) .... One Twice A Day With Food 11)  Etodolac 300 Mg Caps (Etodolac) .... One By Mouth Bid 12)  Cymbalta 60 Mg Cpep (Duloxetine Hcl) .... One By Mouth  Each Day 13)  Allegra 180 Mg Tabs (Fexofenadine Hcl) .... Take 1 Tablet By Mouth Once A Day As Needed Allergies 14)  Toviaz 8 Mg Xr24h-Tab (Fesoterodine Fumarate) .... One By Mouth Daily 15)  Tussionex Pennkinetic Er 8-10 Mg/23ml Lqcr (Chlorpheniramine-Hydrocodone) .Marland Kitchen.. 1 Tsp Every 12 Hours As Needed Cough 16)  Miralax  Powd (Polyethylene Glycol 3350) .Marland KitchenMarland KitchenMarland Kitchen 17 Gms A Day in Water 17)  Cranberry Fruit 405 Mg Caps (Cranberry) .... 2000mg  Per Day 18)  Allegra 180 Mg Tabs (Fexofenadine Hcl) .Marland Kitchen.. 1 By Mouth Once Daily 19)  Carafate 1 Gm/14ml Susp (Sucralfate) .... Take 1 Gram Between Meals and At Bedtime  Current Medications (verified): 1)  Calan Sr 240 Mg  Tbcr (Verapamil Hcl) .... Once Daily 2)  Daily Multi-Vitamins/iron   Tabs (Multiple Vitamins-Iron) .... Once Daily 3)  Benicar Hct 40-25 Mg  Tabs (Olmesartan Medoxomil-Hctz) .... Once Daily 4)  Hydrocodone-Acetaminophen 5-325 Mg Tabs (Hydrocodone-Acetaminophen) .... One By Mouth Three Times A Day As Needed 5)  Desoximetasone 0.25 %  Crea (Desoximetasone) .... Apply To Rash Prn 6)  Amitriptyline Hcl 25 Mg  Tabs (Amitriptyline Hcl) .Marland Kitchen.. 1 Once Daily 7)  Hypercare 20 % Soln (Aluminum Chloride) .... Apply Inder Arms Once Daily 8)  Klor-Con M20 20 Meq Cr-Tabs (Potassium  Chloride Crys Cr) .... One By Mouth Daily 9)  Nexium 40 Mg Cpdr (Esomeprazole Magnesium) .... One By Mouth Daily 10)  Ferrous Sulfate 324 Mg Tbec (Ferrous Sulfate) .... One Twice A Day With Food 11)  Cymbalta 60 Mg Cpep (Duloxetine Hcl) .... One By Mouth  Each Day 12)  Allegra 180 Mg Tabs (Fexofenadine Hcl) .... Take 1 Tablet By Mouth Once A Day As Needed Allergies 13)  Toviaz 8 Mg Xr24h-Tab (Fesoterodine Fumarate) .... One By Mouth Daily 14)  Tussionex Pennkinetic Er 8-10 Mg/45ml Lqcr (Chlorpheniramine-Hydrocodone) .Marland Kitchen.. 1 Tsp Every 12 Hours As Needed Cough 15)  Miralax  Powd (Polyethylene Glycol 3350) .Marland KitchenMarland KitchenMarland Kitchen 17 Gms A Day in Water 16)  Cranberry Fruit 405 Mg Caps (Cranberry) .... 2000mg  Per Day 17)  Allegra 180 Mg Tabs (Fexofenadine Hcl) .Marland Kitchen.. 1 By Mouth Once Daily 18)  Carafate 1 Gm/51ml Susp (Sucralfate) .... Take 1 Gram Between Meals and At Bedtime  Allergies (verified): 1)  Nsaids  Past History:  Family History: Last updated:  02/21/2010 Family History Diabetes 1st degree relative Family History of Heart Disease:  No FH of Colon Cancer:  Social History: Last updated: 03/14/2008 Occupation: Conservation officer, nature Former Smoker Alcohol use-no Drug use-no Regular exercise-no  Risk Factors: Exercise: no (03/12/2007)  Risk Factors: Smoking Status: quit (03/06/2010)  Past medical, surgical, family and social histories (including risk factors) reviewed, and no changes noted (except as noted below).  Past Medical History: Reviewed history from 02/21/2010 and no changes required. Menopause Low back pain PUD (ICD-533.90) ESOPHAGITIS, HX OF (ICD-V12.79) DIVERTICULOSIS OF COLON (ICD-562.10) LOW BACK PAIN (ICD-724.2) ESOPHAGEAL MOTILITY DISORDER (ICD-530.5) CONSTIPATION, CHRONIC (ICD-564.09) HYPERGLYCEMIA, BORDERLINE (ICD-790.29) COUGH, CHRONIC (ICD-786.2) ESOPHAGEAL STRICTURE (ICD-530.3) HYPERTENSION (ICD-401.9) GERD (ICD-530.81)  Past Surgical History: Reviewed history from 12/09/2008  and no changes required. Hysterectomy Rotator cuff repair graves Oophorectomy cataracts on left and right ( [facility] [place])  Family History: Reviewed history from 02/21/2010 and no changes required. Family History Diabetes 1st degree relative Family History of Heart Disease:  No FH of Colon Cancer:  Social History: Reviewed history from 03/14/2008 and no changes required. Occupation: Conservation officer, nature Former Smoker Alcohol use-no Drug use-no Regular exercise-no  Review of Systems  The patient denies fever, weight loss, weight gain, vision loss, decreased hearing, hoarseness, chest pain, syncope, dyspnea on exertion, peripheral edema, prolonged cough, headaches, hemoptysis, abdominal pain, melena, hematochezia, severe indigestion/heartburn, hematuria, incontinence, genital sores, muscle weakness, suspicious skin lesions, transient blindness, difficulty walking, depression, unusual weight change, abnormal bleeding, enlarged lymph nodes, angioedema, breast masses, and testicular masses.    Physical Exam  General:  Well developed, well nourished, no acute distress. Head:  Normocephalic and atraumatic. Ears:  R ear normal and L ear normal.   Nose:  nasal dischargemucosal pallor and mucosal erythema.   Mouth:  No oral lesions. Tongue moist.  Neck:  Supple; no masses. Lungs:  Clear throughout to auscultation.no crackles and no wheezes.   Heart:  normal rate and regular rhythm.   Abdomen:  soft and non-tender.   Msk:  no joint tenderness, no joint swelling, and no joint warmth.   Neurologic:  alert & oriented X3 and gait normal.     Impression & Recommendations:  Problem # 1:  HYPERTENSION (ICD-401.9) Assessment Deteriorated moderately high risk for renal dz moniter carefully creatinine Her updated medication list for this problem includes:    Calan Sr 240 Mg Tbcr (Verapamil hcl) ..... Once daily    Benicar Hct 40-25 Mg Tabs (Olmesartan medoxomil-hctz) ..... Once daily home monitering of  blood pressure consider changeing HCTZ to lasix  BP today: 150/80 Prior BP: 100/60 (02/21/2010)  Prior 10 Yr Risk Heart Disease: Not enough information (05/05/2007)  Labs Reviewed: K+: 4.6 (02/27/2010) Creat: : 1.6 (02/27/2010)     Orders: Venipuncture (16109) Specimen Handling (60454) TLB-BMP (Basic Metabolic Panel-BMET) (80048-METABOL)  Problem # 2:  OSTEOARTHRITIS (ICD-715.9) Assessment: Unchanged stop all NASID with increased renal risks The following medications were removed from the medication list:    Etodolac 300 Mg Caps (Etodolac) ..... One by mouth bid Her updated medication list for this problem includes:    Hydrocodone-acetaminophen 5-325 Mg Tabs (Hydrocodone-acetaminophen) ..... One by mouth three times a day as needed  Discussed use of medications, application of heat or cold, and exercises.   Problem # 3:  RENAL CYST (ICD-593.2) leftr renal cyst with [place] monitering in 6 months  Problem # 4:  ULCER-GASTRIC (ICD-531.9) precerative dz witrh response for the carafate  Her updated medication list for this problem includes:    Nexium 40 Mg Cpdr (  Esomeprazole magnesium) ..... One by mouth daily    Carafate 1 Gm/87ml Susp (Sucralfate) .Marland Kitchen... Take 1 gram between meals and at bedtime  Problem # 5:  IRON DEFICIENCY ANEMIA SECONDARY TO BLOOD LOSS (ICD-280.0)  Her updated medication list for this problem includes:    Ferrous Sulfate 324 Mg Tbec (Ferrous sulfate) ..... One twice a day with food  Hgb: 11.3 (02/21/2010)   Hct: 33.2 (02/21/2010)   Platelets: 395.0 (02/21/2010) RBC: 3.65 (02/21/2010)   RDW: 14.7 (02/21/2010)   WBC: 9.3 (02/21/2010) MCV: 90.9 (02/21/2010)   MCHC: 33.9 (02/21/2010) Ferritin: 181.5 (09/19/2008) Iron: 69 (07/21/2009)   % Sat: 22.1 (07/21/2009) B12: 427 (09/16/2008)   Folate: 9.0 (09/16/2008)   TSH: 1.22 (01/31/2010)  Problem # 6:  ANEMIA (ICD-285.9)  b12 was 400 range Her updated medication list for this problem includes:    Ferrous  Sulfate 324 Mg Tbec (Ferrous sulfate) ..... One twice a day with food  Hgb: 11.3 (02/21/2010)   Hct: 33.2 (02/21/2010)   Platelets: 395.0 (02/21/2010) RBC: 3.65 (02/21/2010)   RDW: 14.7 (02/21/2010)   WBC: 9.3 (02/21/2010) MCV: 90.9 (02/21/2010)   MCHC: 33.9 (02/21/2010) Ferritin: 181.5 (09/19/2008) Iron: 69 (07/21/2009)   % Sat: 22.1 (07/21/2009) B12: 427 (09/16/2008)   Folate: 9.0 (09/16/2008)   TSH: 1.22 (01/31/2010)  Orders: Vit B12 1000 mcg (J3420) Admin of Therapeutic Inj  intramuscular or subcutaneous (16109)  Problem # 7:  OSTEOARTHRITIS (ICD-715.9)  The following medications were removed from the medication list:    Etodolac 300 Mg Caps (Etodolac) ..... One by mouth bid Her updated medication list for this problem includes:    Hydrocodone-acetaminophen 5-325 Mg Tabs (Hydrocodone-acetaminophen) ..... One by mouth three times a day as needed  Discussed use of medications, application of heat or cold, and exercises.   Complete Medication List: 1)  Calan Sr 240 Mg Tbcr (Verapamil hcl) .... Once daily 2)  Daily Multi-vitamins/iron Tabs (Multiple vitamins-iron) .... Once daily 3)  Benicar Hct 40-25 Mg Tabs (Olmesartan medoxomil-hctz) .... Once daily 4)  Hydrocodone-acetaminophen 5-325 Mg Tabs (Hydrocodone-acetaminophen) .... One by mouth three times a day as needed 5)  Desoximetasone 0.25 % Crea (Desoximetasone) .... Apply to rash prn 6)  Amitriptyline Hcl 25 Mg Tabs (Amitriptyline hcl) .Marland Kitchen.. 1 once daily 7)  Hypercare 20 % Soln (Aluminum chloride) .... Apply inder arms once daily 8)  Klor-con M20 20 Meq Cr-tabs (Potassium chloride crys cr) .... One by mouth daily 9)  Nexium 40 Mg Cpdr (Esomeprazole magnesium) .... One by mouth daily 10)  Ferrous Sulfate 324 Mg Tbec (Ferrous sulfate) .... One twice a day with food 11)  Cymbalta 60 Mg Cpep (Duloxetine hcl) .... One by mouth  each day 12)  Allegra 180 Mg Tabs (Fexofenadine hcl) .... Take 1 tablet by mouth once a day as needed  allergies 13)  Toviaz 8 Mg Xr24h-tab (Fesoterodine fumarate) .... One by mouth daily 14)  Tussionex Pennkinetic Er 8-10 Mg/82ml Lqcr (Chlorpheniramine-hydrocodone) .Marland Kitchen.. 1 tsp every 12 hours as needed cough 15)  Miralax Powd (Polyethylene glycol 3350) .Marland KitchenMarland Kitchen. 17 gms a day in water 16)  Cranberry Fruit 405 Mg Caps (Cranberry) .... 2000mg  per day 17)  Allegra 180 Mg Tabs (Fexofenadine hcl) .Marland Kitchen.. 1 by mouth once daily 18)  Carafate 1 Gm/4ml Susp (Sucralfate) .... Take 1 gram between meals and at bedtime  Other Orders: Flu Vaccine 9yrs + MEDICARE PATIENTS (U0454) Administration Flu vaccine - MCR (U9811)  Hypertension Assessment/Plan:      The patient's hypertensive risk group is category B:  At least one risk factor (excluding diabetes) with no target organ damage.  Today's blood pressure is 150/80.  Her blood pressure goal is < 140/90.  Patient Instructions: 1)  Please schedule a follow-up appointment in 2 months. 2)  BMP prior to visit, ICD-9: 401.9   Not Administered:    Influenza Vaccine not given  Flu Vaccine Consent Questions:    Do you have a history of severe allergic reactions to this vaccine? no    Any prior history of allergic reactions to egg and/or gelatin? no    Do you have a sensitivity to the preservative Thimersol? no    Do you have a past history of Guillan-Barre Syndrome? no    Do you currently have an acute febrile illness? no    Have you ever had a severe reaction to latex? no    Vaccine information given and explained to patient? yes    Are you currently pregnant? no Flu Vaccine Consent Questions     Do you have a history of severe allergic reactions to this vaccine? no    Any prior history of allergic reactions to egg and/or gelatin? no    Do you have a sensitivity to the preservative Thimersol? no    Do you have a past history of Guillan-Barre Syndrome? no    Do you currently have an acute febrile illness? no    Have you ever had a severe reaction to latex? no     Vaccine information given and explained to patient? yes    Are you currently pregnant? no    Lot Number:[id number]   Exp Date:12/22/2010   Site Given  Left Deltoid IM    Do you have a sensitivity to the preservative Thimersol? no    Do you have a past history of Guillan-Barre Syndrome? no    Do you currently have an acute febrile illness? no    Have you ever had a severe reaction to latex? no    Vaccine information given and explained to patient? yes    Are you currently pregnant? no .lbmedflu    Medication Administration  Injection # 1:    Medication: Vit B12 1000 mcg    Diagnosis: ANEMIA (ICD-285.9)    Route: IM    Site: R deltoid    Exp Date: 01/22/2012    Lot #: [id number]    Mfr: [employer] Regent    Patient tolerated injection without complications    Given by: Willy Eddy, LPN (March 06, 2010 10:42 AM)  Orders Added: 1)  Est. Patient Level IV [02725] 2)  Venipuncture [36644] 3)  Specimen Handling [99000] 4)  Flu Vaccine 68yrs + MEDICARE PATIENTS [Q2039] 5)  Administration Flu vaccine - MCR [G0008] 6)  Vit B12 1000 mcg [J3420] 7)  Admin of Therapeutic Inj  intramuscular or subcutaneous [96372] 8)  TLB-BMP (Basic Metabolic Panel-BMET) [80048-METABOL]

## 2010-07-24 NOTE — Progress Notes (Signed)
Summary: cough  Phone Note Call from Patient   Caller: Patient Call For: Stacie Glaze MD Summary of Call: Pt is complaining of productive cough (green) x 2 days.  ? fever.  URI symptoms x 3 days. [facility]) [phone] Initial call taken by: Lynann Beaver CMA,  August 10, 2009 10:55 AM  Follow-up for Phone Call        zpack call in tussinex if she needs it 1 tsp by mouth two times a day for 10 days 4 oz Follow-up by: Stacie Glaze MD,  August 10, 2009 2:44 PM    New/Updated Medications: Sandria Senter ER 8-10 MG/5ML LQCR (CHLORPHENIRAMINE-HYDROCODONE) one teaspoon q 12 hours as needed cough. ZITHROMAX Z-PAK 250 MG TABS (AZITHROMYCIN) as directed Prescriptions: ZITHROMAX Z-PAK 250 MG TABS (AZITHROMYCIN) as directed  #6 x 0   Entered by:   Lynann Beaver CMA   Authorized by:   Stacie Glaze MD   Signed by:   Lynann Beaver CMA on 08/10/2009   Method used:   Telephoned to ...       [facility] [address] [phone]* (retail)       [address]       [place], [place]  [address]       Ph: [phone] or [phone]       Fax: [phone]   RxID:   [id number] Sandria Senter ER 8-10 MG/5ML LQCR (CHLORPHENIRAMINE-HYDROCODONE) one teaspoon q 12 hours as needed cough.  #4 oz x 0   Entered by:   Lynann Beaver CMA   Authorized by:   Stacie Glaze MD   Signed by:   Lynann Beaver CMA on 08/10/2009   Method used:   Telephoned to ...       [facility]  [facility] [address] [phone]* (retail)       [address]       [place], [place]  [address]       Ph: [phone] or [phone]       Fax: [phone]   RxID:   [id number]

## 2010-07-26 NOTE — Letter (Signed)
Summary: [facility]  [facility]   Imported By: Maryln Gottron 07/10/2010 12:37:10  _____________________________________________________________________  External Attachment:    Type:   Image     Comment:   External Document

## 2010-07-26 NOTE — Assessment & Plan Note (Signed)
Summary: BLOOD IN STOOL...AS.    History of Present Illness Visit Type: Follow-up Visit Primary GI MD: Lina Sar MD Primary Provider: Darryll Capers MD Requesting Provider: n/a Chief Complaint: Blood in stool, BRB on tissue, abdominal pain & nausea History of Present Illness:   This is a 77 year old African American female with a history of a gastric ulcer on upper endoscopy in 1987. At that time, she was also treated for H. pylori. She has gastroesophageal reflux disease and she was at one point evaluated for a Nissen fundoplication in 2008. Her esophageal manometry was normal but she was unable to cooperate for a 24-hour pH study. Her barium esophagram in 2008 showed spasm in the distal esophagus. She is on Nexium 40 mg a day with breakthrough symptoms. In 2009, she was found to have an esopahgeal stricture which was dilated with 17 mm dilator. She, at that point had a 3 cm hiatal hernia. On the last endoscopy in March 2010, she had an 8 mm gastric ulcer which was again treated. An upper abdominal ultrasound at that time showed a small renal cyst. Her last colonoscopy in 2008 showed mild diverticulosis of the left colon. She had several episodes of rectal bleeding recently. She has had minor episodes of rectal leakage.   GI Review of Systems    Reports abdominal pain, bloating, and  nausea.     Location of  Abdominal pain: epigastric area.    Denies acid reflux, belching, chest pain, dysphagia with liquids, dysphagia with solids, heartburn, loss of appetite, vomiting, vomiting blood, weight loss, and  weight gain.      Reports constipation, fecal incontinence, and  rectal bleeding.     Denies anal fissure, black tarry stools, change in bowel habit, diarrhea, diverticulosis, heme positive stool, hemorrhoids, irritable bowel syndrome, jaundice, light color stool, liver problems, and  rectal pain.    Current Medications (verified): 1)  Calan Sr 240 Mg  Tbcr (Verapamil Hcl) .... Once Daily 2)   Daily Multi-Vitamins/iron   Tabs (Multiple Vitamins-Iron) .... Once Daily 3)  Benicar Hct 40-12.5 Mg Tabs (Olmesartan Medoxomil-Hctz) .... One Tablet By Mouth Once Daily 4)  Hydrocodone-Acetaminophen 5-500 Mg Tabs (Hydrocodone-Acetaminophen) .Marland Kitchen.. 1every 4-6 Hours As Needed Pain 5)  Desoximetasone 0.25 %  Crea (Desoximetasone) .... Apply To Rash Prn 6)  Amitriptyline Hcl 25 Mg  Tabs (Amitriptyline Hcl) .Marland Kitchen.. 1 Once Daily 7)  Hypercare 20 % Soln (Aluminum Chloride) .... Apply Inder Arms Once Daily 8)  Klor-Con M20 20 Meq Cr-Tabs (Potassium Chloride Crys Cr) .... One By Mouth Daily 9)  Nexium 40 Mg Cpdr (Esomeprazole Magnesium) .... One By Mouth Daily 10)  Ferrous Sulfate 324 Mg Tbec (Ferrous Sulfate) .... One Twice A Day With Food 11)  Cymbalta 60 Mg Cpep (Duloxetine Hcl) .... One By Mouth  Each Day 12)  Allegra 180 Mg Tabs (Fexofenadine Hcl) .... Take 1 Tablet By Mouth Once A Day As Needed Allergies 13)  Toviaz 8 Mg Xr24h-Tab (Fesoterodine Fumarate) .... One By Mouth Daily 14)  Tussionex Pennkinetic Er 8-10 Mg/52ml Lqcr (Chlorpheniramine-Hydrocodone) .Marland Kitchen.. 1 Tsp Every 12 Hours As Needed Cough 15)  Miralax  Powd (Polyethylene Glycol 3350) .Marland KitchenMarland KitchenMarland Kitchen 17 Gms A Day in Water 16)  Cranberry Fruit 405 Mg Caps (Cranberry) .... 2000mg  Per Day 17)  Allegra 180 Mg Tabs (Fexofenadine Hcl) .Marland Kitchen.. 1 By Mouth Once Daily 18)  Carafate 1 Gm/107ml Susp (Sucralfate) .... Take 1 Gram Between Meals and At Bedtime 19)  Promethazine Hcl 25 Mg Tabs (Promethazine Hcl) .... Take  1/2-1 Tablet By Mouth Every 6 Hours As Needed For Nausea  Allergies (verified): 1)  Nsaids  Past History:  Past Medical History: Reviewed history from 02/21/2010 and no changes required. Menopause Low back pain PUD (ICD-533.90) ESOPHAGITIS, HX OF (ICD-V12.79) DIVERTICULOSIS OF COLON (ICD-562.10) LOW BACK PAIN (ICD-724.2) ESOPHAGEAL MOTILITY DISORDER (ICD-530.5) CONSTIPATION, CHRONIC (ICD-564.09) HYPERGLYCEMIA, BORDERLINE (ICD-790.29) COUGH,  CHRONIC (ICD-786.2) ESOPHAGEAL STRICTURE (ICD-530.3) HYPERTENSION (ICD-401.9) GERD (ICD-530.81)  Past Surgical History: Reviewed history from 12/09/2008 and no changes required. Hysterectomy Rotator cuff repair graves Oophorectomy cataracts on left and right ( [facility])  Family History: Reviewed history from 02/21/2010 and no changes required. Family History Diabetes 1st degree relative Family History of Heart Disease:  No FH of Colon Cancer:  Social History: Reviewed history from 03/14/2008 and no changes required. Occupation: Conservation officer, nature Former Smoker Alcohol use-no Drug use-no Regular exercise-no  Review of Systems       The patient complains of arthritis/joint pain and urination - excessive.  The patient denies allergy/sinus, anemia, anxiety-new, back pain, blood in urine, breast changes/lumps, change in vision, confusion, cough, coughing up blood, depression-new, fainting, fatigue, fever, headaches-new, hearing problems, heart murmur, heart rhythm changes, itching, menstrual pain, muscle pains/cramps, night sweats, nosebleeds, pregnancy symptoms, shortness of breath, skin rash, sleeping problems, sore throat, swelling of feet/legs, swollen lymph glands, thirst - excessive , urination - excessive , urination changes/pain, urine leakage, vision changes, and voice change.         Pertinent positive and negative review of systems were noted in the above HPI. All other ROS was otherwise negative.   Vital Signs:  Patient profile:   77 year old female Height:      66 inches Weight:      186.13 pounds BMI:     30.15 Pulse rate:   72 / minute Pulse rhythm:   regular BP sitting:   144 / 70  (left arm) Cuff size:   regular  Vitals Entered By: June McMurray CMA Duncan Dull) (June 04, 2010 3:39 PM)  Physical Exam  General:  Well developed, well nourished, no acute distress. Eyes:  PERRLA, no icterus. Mouth:  No deformity or lesions, dentition normal. Neck:  Supple; no masses  or thyromegaly. Lungs:  Clear throughout to auscultation. Heart:  Regular rate and rhythm; no murmurs, rubs,  or bruits. Abdomen:  Soft, nontender and nondistended. No masses, hepatosplenomegaly or hernias noted. Normal bowel sounds. Rectal:  normal perianal area. Normal rectal sphincter tone which relaxes and tightens appropriately. Small amount of Hemoccult trace positive stool. Extremities:  No clubbing, cyanosis, edema or deformities noted. Skin:  Intact without significant lesions or rashes. Psych:  Alert and cooperative. Normal mood and affect.   Impression & Recommendations:  Problem # 1:  CONSTIPATION (ICD-564.00) Patient has irritable bowel syndrome with predominant constipation and intermittent low-volume rectal leakage. This may be due to rectal sphincter dysfunction or possibly hemorrhoids. She is up-to-date on her colonoscopy. Her last exam was in 2008. I have put her on Anusol-HC suppositories q.h.s. for week then she should repeat stool Hemoccults. If these are positive, then we should proceed with a  colonoscopy.  Problem # 2:  ULCER-GASTRIC (ICD-531.9) Patient has had breakthrough symptoms on Nexium 40 mg q.a.m. I have given her samples of Dexilant 60 mg daily. She is to continue Carafate slurry 10 cc twice a day and p.r.n.  Patient Instructions: 1)  Please pick up your prescriptions at the pharmacy. Electronic prescription(s) has already been sent for Anusol suppostories. You should use these  x 1 week. 2)  After completing your Anusol suppositories, you should follow the directions given to you for your Hemocult cards and send them back to our office.Use Nexium 40 mg q.a.m. and Dexilant 60 mg q.h.s. 3)  Continue Carafate slurry as needed. 4)  Recall colonoscopy 2013. 5)  Copy sent to : Darryll Capers, MD 6)  The medication list was reviewed and reconciled.  All changed / newly prescribed medications were explained.  A complete medication list was provided to the patient /  caregiver. Prescriptions: ANUSOL-HC 25 MG SUPP (HYDROCORTISONE ACETATE) Insert 1 suppository into rectum at bedtime x 1 week  #12 x 0   Entered by:   Lamona Curl CMA (AAMA)   Authorized by:   Hart Carwin MD   Signed by:   Lamona Curl CMA (AAMA) on 06/04/2010   Method used:   [facility]        [facility] [address] [phone][facility] (retail)       [address]       [place], [place]  [address]       Ph: [phone] or [phone]       Fax: [phone]   RxID:   [id number]

## 2010-07-26 NOTE — Progress Notes (Signed)
Summary: Hydromet  Phone Note Call from Patient Call back at Home Phone [phone]   Caller: Patient Call For: Stacie Glaze MD Summary of Call: Pt is asking for Hydromet for cough. [facility] Initial call taken by: Lynann Beaver CMA AAMA,  July 06, 2010 11:23 AM    New/Updated Medications: HYDROMET 5-1.5 MG/5ML SYRP (HYDROCODONE-HOMATROPINE) 1 tsp every 6 hours prn Prescriptions: HYDROMET 5-1.5 MG/5ML SYRP (HYDROCODONE-HOMATROPINE) 1 tsp every 6 hours prn  #6oz x 1   Entered by:   Willy Eddy, LPN   Authorized by:   Stacie Glaze MD   Signed by:   Willy Eddy, LPN on 41/32/4401   Method used:   Telephoned to ...       [facility] [address] [phone]* (retail)       [address]       [place], [place]  [address]       Ph: [phone] or [phone]       Fax: [phone]   RxID:   [id number]

## 2010-07-26 NOTE — Consult Note (Signed)
Summary: [facility]   Imported By: Maryln Gottron 06/29/2010 14:01:33  _____________________________________________________________________  External Attachment:    Type:   Image     Comment:   External Document

## 2010-08-08 ENCOUNTER — Other Ambulatory Visit: Payer: Self-pay | Admitting: Internal Medicine

## 2010-08-08 DIAGNOSIS — N281 Cyst of kidney, acquired: Secondary | ICD-10-CM

## 2010-08-10 ENCOUNTER — Telehealth: Payer: Self-pay | Admitting: *Deleted

## 2010-08-10 NOTE — Telephone Encounter (Signed)
mucinex cough and cold fast max and take  One cap full 4 times a day

## 2010-08-10 NOTE — Telephone Encounter (Signed)
Pt requesting medication be called in for croupy cough of 1 week and pt states she can hear 'rattling' at nigh in chest--afebrile and non productive cough-[facility] after lunch

## 2010-08-10 NOTE — Telephone Encounter (Signed)
Pt informed

## 2010-08-17 ENCOUNTER — Ambulatory Visit (INDEPENDENT_AMBULATORY_CARE_PROVIDER_SITE_OTHER): Payer: PRIVATE HEALTH INSURANCE | Admitting: Internal Medicine

## 2010-08-17 ENCOUNTER — Other Ambulatory Visit: Payer: Self-pay

## 2010-08-17 ENCOUNTER — Encounter: Payer: Self-pay | Admitting: Internal Medicine

## 2010-08-17 DIAGNOSIS — J45909 Unspecified asthma, uncomplicated: Secondary | ICD-10-CM | POA: Insufficient documentation

## 2010-08-17 DIAGNOSIS — R05 Cough: Secondary | ICD-10-CM

## 2010-08-17 DIAGNOSIS — R110 Nausea: Secondary | ICD-10-CM

## 2010-08-17 DIAGNOSIS — K219 Gastro-esophageal reflux disease without esophagitis: Secondary | ICD-10-CM

## 2010-08-17 MED ORDER — PROMETHAZINE-CODEINE 6.25-10 MG/5ML PO SYRP: 5.0000 mL | ORAL_SOLUTION | ORAL | Status: AC | PRN

## 2010-08-17 MED ORDER — METHYLPREDNISOLONE ACETATE 80 MG/ML IJ SUSP
80.0000 mg | Freq: Once | INTRAMUSCULAR | Status: AC
  Administered 2010-08-17: 80 mg via INTRAMUSCULAR

## 2010-08-17 NOTE — Progress Notes (Signed)
Subjective:    Patient ID: [name], female    DOB: [birth year], 77 y.o.   MRN: [id number]  HPI   patient is a 77 year old African American female with a history of chronic cough which is felt to be multifactorial due in part to asthma due to part to reflux with severe hiatal hernia is history of esophageal stricture and known esophagitis he presents today with a worsening cough over the past month she cannot stop coughing in the office she has a slight wheeze from her coughing and it but is not in any respiratory distress at this time.  She denies fever and chills but she has had some nausea she has not had vomiting although she does feel that the reflux has been severe and near  vomiting stage.  he is followed by both gastroenterology and primary care for these problems.  Her blood pressure was initially elevated when she presented at the clinic but after recheck it was 130/82   Review of Systems  Constitutional: Positive for fatigue. Negative for activity change and appetite change.  HENT: Positive for nosebleeds and congestion. Negative for ear pain, neck pain, postnasal drip and sinus pressure.   Eyes: Negative for redness and visual disturbance.  Respiratory: Positive for cough, chest tightness, shortness of breath and wheezing.   Gastrointestinal: Negative for abdominal pain and abdominal distention.  Genitourinary: Negative for dysuria, frequency and menstrual problem.  Musculoskeletal: Negative for myalgias, joint swelling and arthralgias.  Skin: Negative for rash and wound.  Neurological: Negative for dizziness, weakness and headaches.  Hematological: Negative for adenopathy. Does not bruise/bleed easily.  Psychiatric/Behavioral: Negative for sleep disturbance and decreased concentration.       Past Medical History  Diagnosis Date  . PUD (peptic ulcer disease)   . Dyskinesia of esophagus   . Unspecified essential hypertension   . Esophageal reflux    Past Surgical  History  Procedure Date  . Abdominal hysterectomy   . Rotator cuff repair   . Oophorectomy   . Cataracts     reports that she has quit smoking. She does not have any smokeless tobacco history on file. She reports that she does not drink alcohol or use illicit drugs. family history includes Asthma in her mother.     Objective:   Physical Exam  Constitutional: She is oriented to person, place, and time. She appears well-developed and well-nourished. No distress.  HENT:  Head: Normocephalic and atraumatic.  Right Ear: External ear normal.  Left Ear: External ear normal.  Nose: Nose normal.  Mouth/Throat: Oropharynx is clear and moist.  Eyes: Conjunctivae and EOM are normal. Pupils are equal, round, and reactive to light.  Neck: Normal range of motion. Neck supple. No JVD present. No tracheal deviation present. No thyromegaly present.  Cardiovascular: Normal rate, regular rhythm, normal heart sounds and intact distal pulses.   No murmur heard. Pulmonary/Chest: She has wheezes. She exhibits tenderness.  Abdominal: Soft. Bowel sounds are normal.  Musculoskeletal: Normal range of motion. She exhibits no edema and no tenderness.  Lymphadenopathy:    She has no cervical adenopathy.  Neurological: She is alert and oriented to person, place, and time. She has normal reflexes. No cranial nerve deficit.  Skin: Skin is warm and dry. She is not diaphoretic.  Psychiatric: She has a normal mood and affect. Her behavior is normal.          Assessment & Plan:   severe cough multifactorial most probably secondary to worsening GERD with aspiration  and asthmatic bronchitis from chronic low-grade aspiration we'll increase her Nexium to 40 mg by mouth twice a day we'll give her a cough medicine Phenergan with codeine 2 use when necessary have instructed her to elevate the head of her bed and follow her reflux protocols if symptoms do not abate would referral back to gastroenterology for followup to  make sure that her stricture has not worsened. Patient expressed understanding of this she was given samples of Nexium and a prescription for Phenergan with codeine a shot of Depo-Medrol was administered for the acute wheezing.

## 2010-08-18 ENCOUNTER — Other Ambulatory Visit: Payer: Self-pay | Admitting: Internal Medicine

## 2010-08-24 ENCOUNTER — Ambulatory Visit
Admission: RE | Admit: 2010-08-24 | Discharge: 2010-08-24 | Disposition: A | Discharge status: Home / Self Care | Payer: PRIVATE HEALTH INSURANCE | Source: Ambulatory Visit | Attending: Internal Medicine | Admitting: Internal Medicine

## 2010-08-24 DIAGNOSIS — N281 Cyst of kidney, acquired: Secondary | ICD-10-CM

## 2010-09-07 ENCOUNTER — Ambulatory Visit (INDEPENDENT_AMBULATORY_CARE_PROVIDER_SITE_OTHER): Payer: PRIVATE HEALTH INSURANCE | Admitting: Internal Medicine

## 2010-09-07 ENCOUNTER — Encounter: Payer: Self-pay | Admitting: Internal Medicine

## 2010-09-07 DIAGNOSIS — M5430 Sciatica, unspecified side: Secondary | ICD-10-CM

## 2010-09-07 DIAGNOSIS — R05 Cough: Secondary | ICD-10-CM

## 2010-09-07 DIAGNOSIS — R110 Nausea: Secondary | ICD-10-CM

## 2010-09-07 DIAGNOSIS — K219 Gastro-esophageal reflux disease without esophagitis: Secondary | ICD-10-CM

## 2010-09-07 MED ORDER — METOCLOPRAMIDE HCL 5 MG PO TABS: 5.0000 mg | ORAL_TABLET | Freq: Every day | ORAL | Status: DC

## 2010-09-07 NOTE — Assessment & Plan Note (Signed)
Patient's back pain is persistent she is eligible for another injection in her back we will refer her back to [facility] for a repeat injection

## 2010-09-07 NOTE — Assessment & Plan Note (Signed)
Add Reglan 5 mg at bedtime as a trial for 2 months

## 2010-09-07 NOTE — Progress Notes (Signed)
Subjective:    Patient ID: [name], female    DOB: 06/23/1931, 77 y.o.   MRN: [id number]  HPI   the patient's son 47-year-old American female who presents for followup of the ultrasound of her kidneys for polycystic changes the cysts appear stable on exam done on 32 2012.  His persistent cough that is no worse than her chronic cough but it remains persistently believe this is related to her esophageal reflux disease.   her gastroenterologist to stop the Carafate because he felt it might contribute to her chronic constipation she states that since she has reduced her Carafate that her morning nausea has worsened.   discussed with patient possible interventions including use of risk of Reglan and and mentioned all of the potential side effects of this drug and its possible benefits.    Review of Systems  Constitutional: Negative for activity change, appetite change and fatigue.  HENT: Negative for ear pain, congestion, neck pain, postnasal drip and sinus pressure.   Eyes: Negative for redness and visual disturbance.  Respiratory: Positive for cough. Negative for shortness of breath and wheezing.   Gastrointestinal: Positive for nausea, constipation and abdominal distention. Negative for abdominal pain.  Genitourinary: Negative for dysuria, frequency and menstrual problem.  Musculoskeletal: Negative for myalgias, joint swelling and arthralgias.  Skin: Negative for rash and wound.  Neurological: Negative for dizziness, weakness and headaches.  Hematological: Negative for adenopathy. Does not bruise/bleed easily.  Psychiatric/Behavioral: Negative for sleep disturbance and decreased concentration.      Past Medical History  Diagnosis Date  . PUD (peptic ulcer disease)   . Dyskinesia of esophagus   . Unspecified essential hypertension   . Esophageal reflux    Past Surgical History  Procedure Date  . Abdominal hysterectomy   . Rotator cuff repair   . Oophorectomy   . Cataracts      reports that she quit smoking about 20 years ago. She does not have any smokeless tobacco history on file. She reports that she does not drink alcohol or use illicit drugs. family history includes Diabetes in her mother. Allergies  Allergen Reactions  . Nsaids     REACTION: intolerant due to renal  function    Objective:   Physical Exam  Constitutional: She is oriented to person, place, and time. She appears well-developed and well-nourished. No distress.  HENT:  Head: Normocephalic and atraumatic.  Right Ear: External ear normal.  Left Ear: External ear normal.  Nose: Nose normal.  Mouth/Throat: Oropharynx is clear and moist.  Eyes: Conjunctivae and EOM are normal. Pupils are equal, round, and reactive to light.  Neck: Normal range of motion. Neck supple. No JVD present. No tracheal deviation present. No thyromegaly present.  Cardiovascular: Normal rate, regular rhythm, normal heart sounds and intact distal pulses.   No murmur heard. Pulmonary/Chest: Effort normal and breath sounds normal. She has no wheezes. She exhibits no tenderness.  Abdominal: Soft. Bowel sounds are normal.  Musculoskeletal: Normal range of motion. She exhibits no edema and no tenderness.  Lymphadenopathy:    She has no cervical adenopathy.  Neurological: She is alert and oriented to person, place, and time. She has normal reflexes. No cranial nerve deficit.  Skin: Skin is warm and dry. She is not diaphoretic.  Psychiatric: She has a normal mood and affect. Her behavior is normal.          Assessment & Plan:   we added Reglan to her regimen for her chronic reflux hopefully this  will help her with her morning nausea and improve her chronic cough we believe all of these etiologies are linked together.

## 2010-09-07 NOTE — Assessment & Plan Note (Signed)
The patient has persistent nausea in the early morning hours this could be related to her stricture disease -like flat at night may result in acid reflux which results in nausea this has been worsened since her recent upper respiratory tract infection

## 2010-09-07 NOTE — Assessment & Plan Note (Signed)
The patient's chronic cough has improved since she had a recent upper respiratory tract infection and is at her baseline

## 2010-09-11 ENCOUNTER — Other Ambulatory Visit: Payer: Self-pay | Admitting: Internal Medicine

## 2010-09-11 DIAGNOSIS — M545 Low back pain: Secondary | ICD-10-CM

## 2010-09-11 LAB — POCT HEMOGLOBIN-HEMACUE: Hemoglobin: 12.2 g/dL (ref 12.0–15.0)

## 2010-09-11 LAB — BASIC METABOLIC PANEL
Chloride: 106 mEq/L (ref 96–112)
Creatinine, Ser: 1.2 mg/dL (ref 0.4–1.2)
GFR calc Af Amer: 53 mL/min — ABNORMAL LOW (ref >60)

## 2010-09-14 ENCOUNTER — Ambulatory Visit
Admission: RE | Admit: 2010-09-14 | Discharge: 2010-09-14 | Disposition: A | Discharge status: Home / Self Care | Payer: PRIVATE HEALTH INSURANCE | Source: Ambulatory Visit | Attending: Internal Medicine | Admitting: Internal Medicine

## 2010-09-14 DIAGNOSIS — M545 Low back pain: Secondary | ICD-10-CM

## 2010-10-02 LAB — CBC
HCT: 32.9 % — ABNORMAL LOW (ref 36.0–46.0)
MCHC: 33.4 g/dL (ref 30.0–36.0)
MCV: 87.6 fL (ref 78.0–100.0)
Platelets: 367 10*3/uL (ref 150–400)
RBC: 3.76 MIL/uL — ABNORMAL LOW (ref 3.87–5.11)
WBC: 8 10*3/uL (ref 4.0–10.5)

## 2010-10-02 LAB — URINE MICROSCOPIC-ADD ON

## 2010-10-02 LAB — BASIC METABOLIC PANEL
Calcium: 10.1 mg/dL (ref 8.4–10.5)
Chloride: 109 mEq/L (ref 96–112)
Creatinine, Ser: 1.21 mg/dL — ABNORMAL HIGH (ref 0.4–1.2)
GFR calc Af Amer: 52 mL/min — ABNORMAL LOW (ref >60)
GFR calc non Af Amer: 43 mL/min — ABNORMAL LOW (ref >60)

## 2010-10-02 LAB — URINALYSIS, ROUTINE W REFLEX MICROSCOPIC
Glucose, UA: NEGATIVE mg/dL
Ketones, ur: NEGATIVE mg/dL
Protein, ur: NEGATIVE mg/dL
pH: 5.5 (ref 5.0–8.0)

## 2010-10-04 LAB — DIFFERENTIAL
Basophils Absolute: 0.1 10*3/uL (ref 0.0–0.1)
Basophils Absolute: 0 10*3/uL (ref 0.0–0.1)
Eosinophils Relative: 2 % (ref 0–5)
Lymphocytes Relative: 18 % (ref 12–46)
Lymphocytes Relative: 13 % (ref 12–46)
Lymphs Abs: 2.2 10*3/uL (ref 0.7–4.0)
Lymphs Abs: 2 10*3/uL (ref 0.7–4.0)
Monocytes Absolute: 0.7 10*3/uL (ref 0.1–1.0)
Monocytes Relative: 6 % (ref 3–12)
Neutro Abs: 8.6 10*3/uL — ABNORMAL HIGH (ref 1.7–7.7)
Neutro Abs: 11.5 10*3/uL — ABNORMAL HIGH (ref 1.7–7.7)

## 2010-10-04 LAB — CBC
HCT: 27.9 % — ABNORMAL LOW (ref 36.0–46.0)
HCT: 26.5 % — ABNORMAL LOW (ref 36.0–46.0)
Hemoglobin: 9.2 g/dL — ABNORMAL LOW (ref 12.0–15.0)
Platelets: 899 10*3/uL — ABNORMAL HIGH (ref 150–400)
RBC: 3.23 MIL/uL — ABNORMAL LOW (ref 3.87–5.11)
RDW: 14.4 % (ref 11.5–15.5)
WBC: 12.1 10*3/uL — ABNORMAL HIGH (ref 4.0–10.5)
WBC: 14.6 10*3/uL — ABNORMAL HIGH (ref 4.0–10.5)

## 2010-10-04 LAB — URINALYSIS, ROUTINE W REFLEX MICROSCOPIC
Glucose, UA: NEGATIVE mg/dL
Ketones, ur: NEGATIVE mg/dL
Nitrite: NEGATIVE
Specific Gravity, Urine: 1.015 (ref 1.005–1.030)
pH: 7.5 (ref 5.0–8.0)

## 2010-10-04 LAB — BASIC METABOLIC PANEL
BUN: 19 mg/dL (ref 6–23)
CO2: 28 mEq/L (ref 19–32)
Chloride: 105 mEq/L (ref 96–112)
Chloride: 97 mEq/L (ref 96–112)
GFR calc non Af Amer: 34 mL/min — ABNORMAL LOW (ref >60)
Potassium: 4.8 mEq/L (ref 3.5–5.1)
Potassium: 2.9 mEq/L — ABNORMAL LOW (ref 3.5–5.1)
Sodium: 136 mEq/L (ref 135–145)

## 2010-10-04 LAB — URINE CULTURE
Colony Count: NO GROWTH
Culture: NO GROWTH

## 2010-10-04 LAB — POCT HEMOGLOBIN-HEMACUE: Hemoglobin: 8.2 g/dL — ABNORMAL LOW (ref 12.0–15.0)

## 2010-10-04 LAB — POCT I-STAT, CHEM 8
BUN: 22 mg/dL (ref 6–23)
Creatinine, Ser: 1.5 mg/dL — ABNORMAL HIGH (ref 0.4–1.2)
Glucose, Bld: 101 mg/dL — ABNORMAL HIGH (ref 70–99)
Sodium: 136 mEq/L (ref 135–145)
TCO2: 30 mmol/L (ref 0–100)

## 2010-10-12 ENCOUNTER — Telehealth: Payer: Self-pay | Admitting: Internal Medicine

## 2010-10-12 NOTE — Telephone Encounter (Signed)
Pt called and is having knee replacement surgery May 4th and pt is needing to see Dr Lovell Sheehan before she has surgery. Pt has ov sch for 5/18. Pls advise.

## 2010-10-12 NOTE — Telephone Encounter (Signed)
Talked with pt and she has rescheduled her surgeru until later in [facility] her current ov will be ok

## 2010-10-16 ENCOUNTER — Ambulatory Visit: Payer: Medicare Other | Admitting: Internal Medicine

## 2010-11-01 ENCOUNTER — Telehealth: Payer: Self-pay | Admitting: *Deleted

## 2010-11-01 NOTE — Telephone Encounter (Signed)
Pt is calling to ask Dr. Lovell Sheehan to schedule another back injection at [facility].  She has a knee replacement surgery coming up next month, and her back is giving her a lot of pain.  Wants to do this first before her surgery.  The last injection did not really help a lot, but is willing to try again.

## 2010-11-01 NOTE — Telephone Encounter (Signed)
Pt instructed to callorth and make it is ok to have prednisone before surgery

## 2010-11-06 NOTE — Assessment & Plan Note (Signed)
[facility]                         GASTROENTEROLOGY OFFICE NOTE   NAME:[name], [name]                        MRN:          [id number]  DATE:11/05/2006                            DOB:          Jun 13, 1931    HISTORY:  [name] is a 77 year old African/American female, a patient  of Dr. Stacie [name], whom we have been seeing for chronic  gastroesophageal reflux.  She has had four evaluations for a chronic  cough.  She is here today for another problem and that is one episode of  bright red blood per rectum which occurred while at work.  She had  bright red blood after straining to have a bowel movement.  Blood was on  the toilet tissue.  She flushed before she could see if there was any  blood in the commode.  This happened four weeks ago.  There has been no  recurrence of a similar episode.  She remains constipated.  She blames  this mostly on some of her medications, especially cough syrup and  Verapamil.   MEDICAL DECISION MAKING:  1. Klor-Con 10 mg b.i.d.  2. Verapamil 240 mg q.d.  3. Vesicare 5 mg p.o. b.i.d.  4. Phenergan with codeine cough syrup.  5. Benicar 40/25 mg, one p.o. q.d.  6. Prevacid 30 mg p.o. b.i.d.  7. She is also on Reglan now t.i.d. because of increased cough.   PHYSICAL EXAMINATION:  VITAL SIGNS:  Blood pressure 110/66, pulse 72,  weight 199 pounds.  GENERAL:  She was alert and oriented, but continued to cough during our  interview.  It was a raspy cough which was non-productive.  NECK:  Supple.  LUNGS:  Clear to auscultation.  No wheezes or rales.  COR:  Normal S1 and S2.  ABDOMEN:  Protuberant and soft, nontender.  RECTAL:  An anoscopic exam reveals normal perianal area.  Rectal tone  was somewhat decreased.  There was erythematous mucosa of the rectal  ampulla.  There was some hyperemia.  No definite hemorrhoids and no  prolapse.  No evidence of proctitis.  There were just anal erosions,  most likely related to  straining.  The stool was Hemoccult positive.   IMPRESSION:  46. A 77 year old African/American female with an episode of      hematochezia and some evidence of anal erosions related to      straining.  2. Chronic constipation.  3. Heme-positive stool.  This could be related to the anal irritation      but in her age group and with chronic constipation, we need to rule      out an obstructing lesion of the colon.  4. History of pyloric channel ulcer in 1989.  5. Gastroesophageal reflux disease with cough and possible aspiration.   PLAN:  1. Colonoscopy is scheduled.  2. Anusol HC suppositories q.h.s.  3. Senokot-S two at bedtime during the week while she is at work.  4. MiraLax 17 grams on weekends.  5. The patient has requested Medrol Dosepak to take for her cough, as      well as Tussionex  one teaspoonful q.12h. until her cough is under      better control.     Hedwig Morton. Juanda Chance, MD     DMB/MedQ  DD: 11/05/2006  DT: 11/05/2006  Job #: [id number]   cc:   Anna Glaze, MD

## 2010-11-06 NOTE — Op Note (Signed)
[name], RIEMENSCHNEIDER                 ACCOUNT NO.:  [id number]   MEDICAL RECORD NO.:  [id number]          PATIENT TYPE:  AMB   LOCATION:  SDS                          FACILITY:  [facility]   PHYSICIAN:  Salley Scarlet., M.D.DATE OF BIRTH:  01/26/1931   DATE OF PROCEDURE:  11/10/2008  DATE OF DISCHARGE:  11/10/2008                               OPERATIVE REPORT   PREOPERATIVE DIAGNOSIS:  Immature cataract right eye.   POSTOPERATIVE DIAGNOSIS:  Immature cataract right eye.   OPERATION:  Kelman phacoemulsification of cataract, right eye.   ANESTHESIA:  General.   JUSTIFICATION FOR PROCEDURE:  This is a 77 year old lady who underwent a  cataract extraction of the left eye 2 years ago.  She had good visual  result but she developed blurring of vision with difficulty seeing to  drive because of cataract of the right eye.  She was found to have a  visual acuity best corrected at 20/200 on the right and 20/30 on the  left.  There was a dense posterior subcapsular cataract of the right  eye.  Cataract extraction with intraocular lens implantation was  recommended.  She was admitted at this time for this purpose.  The case  was done under general anesthesia because she did not tolerate local  anesthesia with earlier case and she requested that we put the sleeve  for this one.   PROCEDURE:  Under the influence of IV sedation and van Lint akinesia  retrobulbar anesthesia was given.  The patient was prepped and draped in  the usual manner.  The lid speculum was inserted on the upper and lower  lid of the right eye and a 4-0 silk traction suture was passed through  the belly of the superior rectus muscle retraction.  A fornix-based  conjunctival flap was turned and hemostasis achieved using  electrocautery.  An incision made in the sclera at the limbus.  This  incision was dissected down to clear cornea using a crescent blade.  A  sideport incision was made at 1:30 o' clock position.  Ocucoat  was  injected into the eye through the sideport incision.  The anterior  chamber was entered through the corneoscleral tunnel incision at 11:30  o'clock position using 2.75 mm keratome.  An anterior capsulotomy was  then made with a bent 25-gauge needle and the nucleus hydrodissected  using Xylocaine.  The KPE handpiece was passed into the eye and the  nucleus was emulsified without difficulty.  The residual cortical  material was aspirated.  The posterior capsule was polished using olive  tip polisher.  The wound was sized, widened slightly to accommodate a  foldable silicone lens.  The lens was seated into the eye behind the  iris without difficulty.  The anterior chamber was reformed and pupils  constricted using Miochol.  The lips of the wound were hydrated and  tested to make sure that there was no leak.  After ascertaining that  there was no leak, the conjunctiva was closed over the wound using  thermal cautery.  1 mL of Celestone and 0.5 mL of gentamicin  were  injected subconjunctivally.  Maxitrol ophthalmic ointment and prilocaine  ointment were applied along with a patch and Fox shield.  The patient  tolerated the procedure well and was discharged to the post anesthesia  recovery in  satisfactory condition.  She was instructed to rest today, to take  Vicodin every 4 hours as needed for pain and see me in office tomorrow  for further evaluation.   DISCHARGE DIAGNOSIS:  Immature cataract right eye.      Salley Scarlet., M.D.  Electronically Signed     TB/MEDQ  D:  11/10/2008  T:  11/11/2008  Job:  [id number]

## 2010-11-08 ENCOUNTER — Telehealth: Payer: Self-pay | Admitting: Internal Medicine

## 2010-11-08 ENCOUNTER — Ambulatory Visit (HOSPITAL_COMMUNITY)
Admission: RE | Admit: 2010-11-08 | Discharge: 2010-11-08 | Disposition: A | Discharge status: Home / Self Care | Payer: Medicare Other | Source: Ambulatory Visit | Attending: Orthopedic Surgery | Admitting: Orthopedic Surgery

## 2010-11-08 ENCOUNTER — Other Ambulatory Visit (HOSPITAL_COMMUNITY): Payer: Self-pay | Admitting: Orthopedic Surgery

## 2010-11-08 ENCOUNTER — Encounter (HOSPITAL_COMMUNITY)
Admission: RE | Admit: 2010-11-08 | Discharge: 2010-11-08 | Disposition: A | Discharge status: Home / Self Care | Payer: Medicare Other | Source: Ambulatory Visit | Attending: Orthopedic Surgery | Admitting: Orthopedic Surgery

## 2010-11-08 DIAGNOSIS — Z01810 Encounter for preprocedural cardiovascular examination: Secondary | ICD-10-CM | POA: Insufficient documentation

## 2010-11-08 DIAGNOSIS — IMO0002 Reserved for concepts with insufficient information to code with codable children: Secondary | ICD-10-CM | POA: Insufficient documentation

## 2010-11-08 DIAGNOSIS — Z01818 Encounter for other preprocedural examination: Secondary | ICD-10-CM | POA: Insufficient documentation

## 2010-11-08 DIAGNOSIS — I517 Cardiomegaly: Secondary | ICD-10-CM | POA: Insufficient documentation

## 2010-11-08 DIAGNOSIS — M25561 Pain in right knee: Secondary | ICD-10-CM

## 2010-11-08 DIAGNOSIS — M479 Spondylosis, unspecified: Secondary | ICD-10-CM | POA: Insufficient documentation

## 2010-11-08 DIAGNOSIS — M899 Disorder of bone, unspecified: Secondary | ICD-10-CM | POA: Insufficient documentation

## 2010-11-08 DIAGNOSIS — M949 Disorder of cartilage, unspecified: Secondary | ICD-10-CM | POA: Insufficient documentation

## 2010-11-08 DIAGNOSIS — M25569 Pain in unspecified knee: Secondary | ICD-10-CM | POA: Insufficient documentation

## 2010-11-08 DIAGNOSIS — Z01812 Encounter for preprocedural laboratory examination: Secondary | ICD-10-CM | POA: Insufficient documentation

## 2010-11-08 LAB — DIFFERENTIAL
Basophils Relative: 0 % (ref 0–1)
Eosinophils Absolute: 0 10*3/uL (ref 0.0–0.7)
Lymphs Abs: 2.7 10*3/uL (ref 0.7–4.0)
Monocytes Absolute: 0.8 10*3/uL (ref 0.1–1.0)
Monocytes Relative: 6 % (ref 3–12)
Neutrophils Relative %: 72 % (ref 43–77)

## 2010-11-08 LAB — COMPREHENSIVE METABOLIC PANEL
ALT: 20 U/L (ref 0–35)
AST: 25 U/L (ref 0–37)
CO2: 24 mEq/L (ref 19–32)
Chloride: 103 mEq/L (ref 96–112)
GFR calc Af Amer: 52 mL/min — ABNORMAL LOW (ref >60)
GFR calc non Af Amer: 43 mL/min — ABNORMAL LOW (ref >60)
Glucose, Bld: 103 mg/dL — ABNORMAL HIGH (ref 70–99)
Sodium: 140 mEq/L (ref 135–145)
Total Bilirubin: 0.2 mg/dL — ABNORMAL LOW (ref 0.3–1.2)

## 2010-11-08 LAB — URINALYSIS, ROUTINE W REFLEX MICROSCOPIC
Bilirubin Urine: NEGATIVE
Glucose, UA: NEGATIVE mg/dL
Hgb urine dipstick: NEGATIVE
Ketones, ur: NEGATIVE mg/dL
Protein, ur: NEGATIVE mg/dL
Urobilinogen, UA: 0.2 mg/dL (ref 0.0–1.0)

## 2010-11-08 LAB — CBC
MCH: 29.7 pg (ref 26.0–34.0)
MCHC: 34 g/dL (ref 30.0–36.0)
MCV: 87.6 fL (ref 78.0–100.0)
Platelets: 427 10*3/uL — ABNORMAL HIGH (ref 150–400)
RBC: 4.27 MIL/uL (ref 3.87–5.11)

## 2010-11-08 LAB — SURGICAL PCR SCREEN: Staphylococcus aureus: POSITIVE — AB

## 2010-11-08 NOTE — Telephone Encounter (Signed)
Faxed OV & Stress to [facility] Short Stay ([phone]).

## 2010-11-09 ENCOUNTER — Ambulatory Visit (INDEPENDENT_AMBULATORY_CARE_PROVIDER_SITE_OTHER): Payer: Medicare Other | Admitting: Internal Medicine

## 2010-11-09 VITALS — BP 148/78 | HR 80 | Temp 98.2°F | Resp 16 | Ht 67.0 in | Wt 180.0 lb

## 2010-11-09 DIAGNOSIS — J45909 Unspecified asthma, uncomplicated: Secondary | ICD-10-CM

## 2010-11-09 DIAGNOSIS — I10 Essential (primary) hypertension: Secondary | ICD-10-CM

## 2010-11-09 DIAGNOSIS — G4700 Insomnia, unspecified: Secondary | ICD-10-CM

## 2010-11-09 MED ORDER — ALPRAZOLAM 0.5 MG PO TABS: 0.5000 mg | ORAL_TABLET | Freq: Every evening | ORAL | Status: DC | PRN

## 2010-11-09 NOTE — Assessment & Plan Note (Signed)
[facility]                         GASTROENTEROLOGY OFFICE NOTE   NAME:[name], [name]                        MRN:          [id number]  DATE:09/05/2006                            DOB:          February 23, 1931    Ms. Lawhead is a 77 year old African-American female with chronic  hyperacidity resulting in a history of peptic ulcer disease,  particularly  an ulcer in 1989 and subsequently esophageal stricture  necessitating esophageal dilatation.  I have known her for 20 years.  Her first endoscopy was in 1978 by Dr. Terrial Rhodes with a repeat  endoscopy in January 1989.  SHe had a pyloric channel ulcer which was  treated with acid suppressing agents, most recently by proton pump  inhibitor. We have evaluated her chronic cough in 1998 with 24-hour  esophageal pH probe while she was off her medications.  Surprisingly the  results of the study were negative for significant reflux.  Her  DeMeester score was 1.9 and 7.9.  He last dilatation esophageal  dilatation was in July 2007.  She has a moderate sized hiatal hernia.  Her cough continues and it is positional.  It occurs at night after she  lies down.  It is dry and she has a lot of mucus in the back of her  throat.  Her medication regimen includes Prevacid 30 mg q.a.m. and  Zegrid 40 mg q.h.s. She is also on metoclopramide 10 mg daily.  She  denies solid food dysphagia. The patient supplements her anti-reflux  regimen with Gaviscon an antacids.   MEDICATIONS:  1. Potassium 10 mEq b.i.d.  2. Prevacid 30 mg q.a.m.  3. Verapamil 240 mg daily.  4. Metoclopramide 10 mg q.h.s.  5. Vesicare 5 mg p.o. b.i.d.  6. Benicar 40/25 p.o. daily   PHYSICAL EXAMINATION:  Blood pressure 120/76, pulse 76 and weight 193  pounds.  She was alert and oriented in no distress.  Her voice was normal, there was no hoarseness; and there was no coughing  during our interview  NECK:  Supple. Oral cavity was normal.  LUNGS:  Clear  to auscultation.  COR:  Quiet precordium, normal S1, normal S2.  ABDOMEN:  Soft, relaxed, nontender.  I could no illicit any pain.  Right  upper quadrant was normal.   The patient reported to m that almost everyday at bedtime she drinks  half-and-half that has been cold in the refrigerator.  It seems to cause  some dysphagia, but she likes the taste of it.  She doe not associate  drinking half-and-half  with nocturnal cough.   IMPRESSION:  A 77 year old female with chronic severe peptic ulcer  disease involving the pyloric channel and most recently esophagus having  esophageal stricture with recurrent need for dilatation.  She has a  chronic cough which was evaluated in the past, last time 10 years ago.   PLAN:  1. Repeat 24-hour pH probe study while she is taking her Prevacid and      Zegrid to see if she has any excess acid .  2. Continue Reglan.  3. Esophageal manometry to determine lower  esophageal sphincter and      her esophageal motility.  4. I have mentioned to the patient the possibility of Nissen      fundoplication in the case that her studies do show significant      reflux.  Although she is 77 years old, she does not have any      significant risk factors and she possibly would be a candidate for      Nissen fundoplication, we will have to make that decision based on      the results of the manometry and the pH probe.  She, also, at some      point ought to have a repeat barium swallow with an esophagogram.      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  It also confirmed the presence      of acid reflux.     Hedwig Morton. Juanda Chance, MD  Electronically Signed    DMB/MedQ  DD: 09/05/2006  DT: 09/06/2006  Job #: [id number]

## 2010-11-09 NOTE — Procedures (Signed)
[facility]  Patient:    [name], [name] Visit Number: [id number] MRN: [id number]          Service Type: END Location: ENDO Attending Physician:  Mervin Hack Dictated by:   Hedwig Morton. Juanda Chance, M.D. [facility] Proc. Date: 12/02/01 Admit Date:  12/02/2001                             Procedure Report  PROCEDURE:  Upper endoscopy with esophageal dilation.  INDICATION:  This 77 year old African-American female has a history of benign distal esophageal stricture.  She has recently developed solid food dysphagia. Last dilatation was carried out in May 2000.  The patient has been on antireflux medications and PPIs.  She also has a history of significant nonreducible hiatal hernia.  She is now undergoing upper endoscopy with esophageal dilatation for recurrent solid food dysphagia.  ENDOSCOPE:  Olympus single-channel videoscope.  SEDATION:  Versed 7 mg IV, Demerol 50 mg IV.  FINDINGS:  Olympus single-channel videoscope passed under direct vision into the posterior pharynx into the esophagus.  The patient was monitored by pulse oximetry.  Her oxygen saturations were satisfactory.  Proximal and mid esophageal mucosa was normal.  There was a concentric fibrous ring at the GE junction at 7-8 cm from the incisors which was partially obstructing. Endoscope traversed into a hiatal hernia without resistance.  There was no bleeding from the stricture.  Stomach:  The stomach was insufflated with air and showed known nonreducible hiatal hernia extending from 38-42 cm from the incisors.  Hiatal hernia measured 4 cm.  Gastric folds were normal.  There were a few pinpoint erosions in the gastric antrum.  The pyloric outlet itself was normal.  Retroflexion of the endoscope confirmed presence of hiatal hernia.  Duodenum:  The duodenum, duodenal bulb, and descending duodenum was normal.  Guidewire was then placed under endoscopic guidance, endoscope was retracted, and  Savary dilator passed over the guidewire using 14, 16, 17, and 18 mm dilators.  There was no significant resistance and no blood on the dilators. The patient tolerated the procedure well.  IMPRESSION: 1. Benign distal esophageal stricture, status post dilatation to [address] [place]. 2. Nonreducible hiatal hernia. 3. Antral gastritis.  PLAN: 1. Resume soft diet. 2. Antireflux measures. 3. Continue medications. Dictated by:   Hedwig Morton. Juanda Chance, M.D. [facility] Attending Physician:  Mervin Hack DD:  12/02/01 TD:  12/04/01 Job: [id number]

## 2010-11-09 NOTE — Progress Notes (Signed)
  Subjective:    Patient ID: [name], female    DOB: 1930/07/12, 77 y.o.   MRN: [id number]  HPI    Review of Systems     Objective:   Physical Exam        Assessment & Plan:

## 2010-11-09 NOTE — Op Note (Signed)
[name], [name]                 ACCOUNT NO.:  [id number]   MEDICAL RECORD NO.:  [id number]          PATIENT TYPE:  AMB   LOCATION:  SDS                          FACILITY:  [facility]   PHYSICIAN:  Salley Scarlet., M.D.DATE OF BIRTH:  [birth year]   DATE OF PROCEDURE:  12/26/2005  DATE OF DISCHARGE:  12/26/2005                                 OPERATIVE REPORT   PREOPERATIVE DIAGNOSIS:  Immature cataract, left eye.   POSTOPERATIVE DIAGNOSIS:  Immature cataract, left eye.   OPERATION:  Phacoemulsification cataract, left eye, with intraocular lens  implantation.   ANESTHESIA:  Local using Xylocaine 2% along with general inhalation  anesthesia.   JUSTIFICATION FOR PROCEDURE:  This is a 77 year old lady who complains of  blurring of vision with difficulty seeing to read.  She is evaluated and  found to have a visual acuity best corrected at 20/40 on the right, 20/70 on  the left.  There were bilateral immature cataracts, worse on the left than  the right.  Cataract extraction with intraocular lens implantation was  recommended.  She is admitted at this time for that purpose.   PROCEDURE:  Under the influence of IV sedation, a Van Lint akinesia and  retrobulbar anesthesia was attempted.  The patient began to holler and  scream and thrash about although she was highly sedated.  The decision was  made at this point to do her under general anesthesia.  Therefore, with the  assistance of the anesthesiologist, the patient was intubated, and she was  then prepped and draped in the usual manner.  The lid speculum was inserted  under the lower lid of the left eye and a 4-0 silk traction suture was  passed through the belly of the superior rectus muscle with traction.  A  small fornix based conjunctival flap was turned and hemostasis was achieved  using cautery.  An incision was made in the sclera at the limbus.  This  incision was dissected down into clear cornea using a crescent blade.  A  side port incision made at the 1:30 o'clock position.  Ocucoat was injected  into the eye through the septal incision.  The anterior chamber was entered  through the corneoscleral tunnel incision at the 11:30 o'clock position.  An  anterior capsulotomy was done using a bent 25 gauge needle.  The nucleus was  hydrodissected using Xylocaine.  The KP handpiece was passed into the eye  and the nucleus was emulsified without difficulty.  The residual cortical  material was aspirated.  The posterior capsule was polished using the olive  tip polisher.  The wound was widened slightly to accommodate a foldable  silicone lens.  The lens was seated into the eye behind the iris.  The  anterior chamber was reformed and the pupil was constricted using Miochol.  The residual Ocucoat was aspirated from the eye.  The lips of the wound  hydrated and tested to make sure that there was no leak.  There was a leak,  therefore, the wound was closed by using a single horizontal suture of 10-0  nylon.  The conjunctiva was closed using thermal cautery.  1 mL of Celestone  and 0.5 mL of gentamicin were injected subconjunctivally.  Maxitrol  ophthalmic ointment and Pilopine ointment were applied along with a patch  and Fox shield.  The patient tolerated the procedure  well and was discharged to the post anesthesia recovery in satisfactory  condition.  She was instructed to rest today, to take Vicodin every 4 hours  as needed for pain, and to see me in the office tomorrow for further  evaluation.   DISCHARGE DIAGNOSIS:  Immature cataract, left eye.      Salley Scarlet., M.D.  Electronically Signed     TB/MEDQ  D:  12/26/2005  T:  12/26/2005  Job:  [id number]

## 2010-11-13 ENCOUNTER — Telehealth: Payer: Self-pay | Admitting: Internal Medicine

## 2010-11-13 MED ORDER — SUCRALFATE 1 GM/10ML PO SUSP: 1.0000 g | Freq: Four times a day (QID) | ORAL | Status: DC

## 2010-11-13 NOTE — Telephone Encounter (Signed)
Okay for Carafate

## 2010-11-13 NOTE — Telephone Encounter (Signed)
Rx sent in for pt.

## 2010-11-13 NOTE — Telephone Encounter (Signed)
Pt states she is having problems with heartburn/indigestion. She states she currently takes Nexium twice a day. Pt is having surgery on Friday for knee replacement. Pt is requesting carafate, she states she took this in the past and it helped. Dr. Arlyce Dice as doc of the day please advise.

## 2010-11-16 ENCOUNTER — Inpatient Hospital Stay (HOSPITAL_COMMUNITY)
Admission: RE | Admit: 2010-11-16 | Discharge: 2010-11-19 | DRG: 470 | Disposition: A | Discharge status: Home Health | Payer: Medicare Other | Source: Ambulatory Visit | Attending: Orthopedic Surgery | Admitting: Orthopedic Surgery

## 2010-11-16 DIAGNOSIS — K219 Gastro-esophageal reflux disease without esophagitis: Secondary | ICD-10-CM | POA: Diagnosis present

## 2010-11-16 DIAGNOSIS — J449 Chronic obstructive pulmonary disease, unspecified: Secondary | ICD-10-CM | POA: Diagnosis present

## 2010-11-16 DIAGNOSIS — M1710 Unilateral primary osteoarthritis, unspecified knee: Principal | ICD-10-CM | POA: Diagnosis present

## 2010-11-16 DIAGNOSIS — Z87891 Personal history of nicotine dependence: Secondary | ICD-10-CM

## 2010-11-16 DIAGNOSIS — J4489 Other specified chronic obstructive pulmonary disease: Secondary | ICD-10-CM | POA: Diagnosis present

## 2010-11-16 LAB — ABO/RH: ABO/RH(D): O POS

## 2010-11-17 LAB — BASIC METABOLIC PANEL
BUN: 20 mg/dL (ref 6–23)
CO2: 29 mEq/L (ref 19–32)
Chloride: 101 mEq/L (ref 96–112)
Creatinine, Ser: 1.04 mg/dL (ref 0.4–1.2)
GFR calc Af Amer: >60 mL/min (ref >60)
Glucose, Bld: 134 mg/dL — ABNORMAL HIGH (ref 70–99)

## 2010-11-17 LAB — CBC
HCT: 23.7 % — ABNORMAL LOW (ref 36.0–46.0)
MCH: 30 pg (ref 26.0–34.0)
MCHC: 34.2 g/dL (ref 30.0–36.0)
MCV: 87.8 fL (ref 78.0–100.0)
RDW: 14.5 % (ref 11.5–15.5)

## 2010-11-18 LAB — CBC
Hemoglobin: 9.2 g/dL — ABNORMAL LOW (ref 12.0–15.0)
MCH: 29.4 pg (ref 26.0–34.0)
MCV: 86.9 fL (ref 78.0–100.0)
Platelets: 219 10*3/uL (ref 150–400)
RBC: 3.13 MIL/uL — ABNORMAL LOW (ref 3.87–5.11)

## 2010-11-18 LAB — TYPE AND SCREEN: Unit division: 0

## 2010-11-18 LAB — BASIC METABOLIC PANEL
CO2: 27 mEq/L (ref 19–32)
Chloride: 101 mEq/L (ref 96–112)
Creatinine, Ser: 0.88 mg/dL (ref 0.4–1.2)
GFR calc Af Amer: >60 mL/min (ref >60)

## 2010-11-19 LAB — CBC
Hemoglobin: 10.1 g/dL — ABNORMAL LOW (ref 12.0–15.0)
MCH: 29.7 pg (ref 26.0–34.0)
MCV: 87.1 fL (ref 78.0–100.0)
RBC: 3.4 MIL/uL — ABNORMAL LOW (ref 3.87–5.11)

## 2010-11-19 LAB — BASIC METABOLIC PANEL
CO2: 29 mEq/L (ref 19–32)
Glucose, Bld: 92 mg/dL (ref 70–99)
Potassium: 4.1 mEq/L (ref 3.5–5.1)
Sodium: 137 mEq/L (ref 135–145)

## 2010-11-22 NOTE — Op Note (Signed)
[name], [name]                 ACCOUNT NO.:  [id number]  MEDICAL RECORD NO.:  [id number]           PATIENT TYPE:  I  LOCATION:  5038                         FACILITY:  [facility]  PHYSICIAN:  Harvie Junior, M.D.   DATE OF BIRTH:  [birth year]  DATE OF PROCEDURE:  11/16/2010 DATE OF DISCHARGE:                              OPERATIVE REPORT   PREOPERATIVE DIAGNOSES:  End-stage degenerative joint disease, right knee.  POSTOPERATIVE DIAGNOSIS:  End-stage degenerative joint disease, right knee.  PRINCIPLE PROCEDURE:  Right total knee replacement with Sigma system, size 2.5 femur, size 2.5 tibia, 10-mm bridging bearing and a 35-mm all- polyethylene patella.  Second surgical procedure would be computer- assisted right total knee replacement.  SURGEON:  Harvie Junior, MD  ASSISTANT:  Marshia Ly, PA  ANESTHESIA:  General.  BRIEF HISTORY:  [name] is a 77 year old female with long history of having had severe pain in her right knee.  We treated conservatively for a period of time with activity modification, injection therapy.  Because she still complains of pain, she was ultimately taken to the operating room for right total knee replacement.  Preoperative x-rays show bone-on- bone arthritis on the lateral compartment.  She did have bit of valgus knee.  She was brought to the operating room for this procedure. Because of her valgus malalignment, we felt that computer assistance was appropriate.  This was chosen and will be using preoperatively.  PROCEDURE:  The patient was taken to the operating room.  After adequate anesthesia was obtained with a general anesthetic, the patient was placed in supine on operating room table, the right leg was prepped and draped in usual sterile fashion.  Following this, an incision was made over the midline of the femur down to the tibial tubercle, subcutaneous tissues down to the level of extensor mechanism and then medial parapatellar  arthrotomy was undertaken.  Prior to this, the leg was exsanguinated.  Blood pressure tourniquet was inflated to 350 mmHg. Once the exposure was made, a medial parapatellar arthrotomy was undertaken and the knee was exposed.  Medial and lateral meniscus, retropatellar fat pad, anterior-posterior cruciates and the synovium anterior aspect of the femur were excised.  Once this was completed, attention was turned towards placement of the computer modules, 2 pins in the tibia, 2 pins in the femur, this adds about 30 minutes of the surgical procedure.  Once this was completed, the tibia was cut perpendicular to its long axis.  The femur was cut perpendicular to the anatomic axis and spacer blocks were then put in place.  Excellent spacer block balance was achieved at this point.  Once this was completed, the attention was turned to the femur, sized to 2.5 and anterior and posterior cuts were made, chamfer cuts and a box cut. Following this, the attention was turned to the tibia which was sized and it was drilled and keeled and 2.5, and a trial 10 poly was placed. This gave excellent full extension under computer assistance perfect neutral long alignment and gap balance.  Following this, attention was turned to the patella, which was then cut  down to the level of 14. A 35 paddle was chosen.  Lugs were then drilled for the femur.  Trial components were then removed and the knee was copiously and thoroughly lavaged, suctioned dry.  The final components were cemented in place, size 2.5 femur, size 2.5 tibia, 10-mm bridging bearing and a 35-mm all- poly patella held with a clamp.  Once the cement was allowed to harden, lots of cement was previously removed with a cement tool.  The trial poly was removed.  Tourniquet let down.  All bleeders were controlled with electrocautery.  Final poly was put in place and the final computer alignment was checked, perfect neutral long alignment and gap  balance. At this point the knee was copiously and thoroughly lavaged.  Bleeding controlled.  Medium Hemovac drain was placed and the medial parapatellar arthrotomy was closed with 1 Vicryl running, skin with 0 and 2-0 Vicryl, and 3-0 Monocryl subcuticular.  Benzoin and Steri-Strips were applied. Sterile compressive dressing was applied.  The patient was taken to recovery room, was noted to be in satisfactory condition.  Estimated blood loss for this procedure was less than 200 mL.     Harvie Junior, M.D.     Ranae Plumber  D:  11/16/2010  T:  11/17/2010  Job:  [id number]  Electronically Signed by Jodi Geralds M.D. on 11/22/2010 08:24:35 AM

## 2010-12-10 ENCOUNTER — Other Ambulatory Visit: Payer: Self-pay | Admitting: *Deleted

## 2010-12-10 ENCOUNTER — Telehealth: Payer: Self-pay | Admitting: *Deleted

## 2010-12-10 MED ORDER — ALPRAZOLAM 0.5 MG PO TABS
0.5000 mg | ORAL_TABLET | Freq: Every evening | ORAL | Status: DC | PRN
Start: 2010-12-10 — End: 2011-07-02

## 2010-12-10 NOTE — Telephone Encounter (Signed)
With pt and she is asking for voltaren gel--her insurance doesn't  Pay and she doesn't want to get--will monitor bp and give reading at ov next week

## 2010-12-10 NOTE — Telephone Encounter (Signed)
Pt would like to have some Voltaren for her knee that she had replacement on........[facility]) [phone].

## 2010-12-17 ENCOUNTER — Encounter: Payer: Self-pay | Admitting: Internal Medicine

## 2010-12-17 ENCOUNTER — Ambulatory Visit (INDEPENDENT_AMBULATORY_CARE_PROVIDER_SITE_OTHER): Payer: Medicare Other | Admitting: Internal Medicine

## 2010-12-17 DIAGNOSIS — R05 Cough: Secondary | ICD-10-CM

## 2010-12-17 DIAGNOSIS — T887XXA Unspecified adverse effect of drug or medicament, initial encounter: Secondary | ICD-10-CM

## 2010-12-17 DIAGNOSIS — M549 Dorsalgia, unspecified: Secondary | ICD-10-CM

## 2010-12-17 DIAGNOSIS — I10 Essential (primary) hypertension: Secondary | ICD-10-CM

## 2010-12-17 DIAGNOSIS — F419 Anxiety disorder, unspecified: Secondary | ICD-10-CM

## 2010-12-17 DIAGNOSIS — F411 Generalized anxiety disorder: Secondary | ICD-10-CM

## 2010-12-17 MED ORDER — VERAPAMIL HCL ER 240 MG PO TBCR: 120.0000 mg | EXTENDED_RELEASE_TABLET | Freq: Every day | ORAL | Status: DC

## 2010-12-17 NOTE — Progress Notes (Signed)
  Subjective:    Patient ID: [name], female    DOB: 12-08-1930, 77 y.o.   MRN: [id number]  HPI  77 year old American female is followed hypertension who hyperlipidemia gastroesophageal reflux and chronic cough. Her chronic cough has worsened recently and has been some suggestion that her ACE RR may be part of the etiology.  Discussed with the patient holding her medications for a period of time to see the medications were the etiology of her cough and then resuming new medications for her to pressure  She has planned knee replacement and presents for surgical approval  Review of Systems  Constitutional: Negative for activity change, appetite change and fatigue.  HENT: Negative for ear pain, congestion, neck pain, postnasal drip and sinus pressure.   Eyes: Negative for redness and visual disturbance.  Respiratory: Negative for cough, shortness of breath and wheezing.   Gastrointestinal: Negative for abdominal pain and abdominal distention.  Genitourinary: Negative for dysuria, frequency and menstrual problem.  Musculoskeletal: Negative for myalgias, joint swelling and arthralgias.  Skin: Negative for rash and wound.  Neurological: Negative for dizziness, weakness and headaches.  Hematological: Negative for adenopathy. Does not bruise/bleed easily.  Psychiatric/Behavioral: Negative for sleep disturbance and decreased concentration.       Objective:   Physical Exam  Nursing note and vitals reviewed. Constitutional: She is oriented to person, place, and time. She appears well-developed and well-nourished. No distress.  HENT:  Head: Normocephalic and atraumatic.  Right Ear: External ear normal.  Left Ear: External ear normal.  Nose: Nose normal.  Mouth/Throat: Oropharynx is clear and moist.  Eyes: Conjunctivae and EOM are normal. Pupils are equal, round, and reactive to light.  Neck: Normal range of motion. Neck supple. No JVD present. No tracheal deviation present. No  thyromegaly present.  Cardiovascular: Normal rate, regular rhythm, normal heart sounds and intact distal pulses.   No murmur heard. Pulmonary/Chest: Effort normal and breath sounds normal. She has no wheezes. She exhibits no tenderness.  Abdominal: Soft. Bowel sounds are normal.  Musculoskeletal: Normal range of motion. She exhibits no edema and no tenderness.  Lymphadenopathy:    She has no cervical adenopathy.  Neurological: She is alert and oriented to person, place, and time. She has normal reflexes. No cranial nerve deficit.  Skin: Skin is warm and dry. She is not diaphoretic.  Psychiatric: She has a normal mood and affect. Her behavior is normal.          Assessment & Plan:  Approval for knee replacement stable hypertension no evidence of CHF Next cough possibly related to ACE inhibitor or ARB inhibitor use we will discontinue her ARB and monitor her off the medication if her cough stops then we'll find a different blood pressure medication she is asked to rechallenge her cough after she has

## 2010-12-17 NOTE — Patient Instructions (Signed)
Stop the Benicar. Take 1 off of the verapamil tablets every day if the blood pressure is elevated take a second half of the verapamil

## 2010-12-27 ENCOUNTER — Telehealth: Payer: Self-pay | Admitting: *Deleted

## 2010-12-27 ENCOUNTER — Other Ambulatory Visit: Payer: Self-pay | Admitting: Internal Medicine

## 2010-12-27 DIAGNOSIS — M549 Dorsalgia, unspecified: Secondary | ICD-10-CM

## 2010-12-27 NOTE — Telephone Encounter (Signed)
Pt wants to have an epidural at [facility], but needs [place] to call and schedule it.  Please call pt.

## 2010-12-27 NOTE — Telephone Encounter (Signed)
Order sent to [facility] they are requiring she ahs inr since she was taking coumadin after surgery-- inr scheduled for 7=9

## 2010-12-31 ENCOUNTER — Other Ambulatory Visit: Payer: Medicare Other | Admitting: Internal Medicine

## 2010-12-31 ENCOUNTER — Ambulatory Visit
Admission: RE | Admit: 2010-12-31 | Discharge: 2010-12-31 | Disposition: A | Discharge status: Home / Self Care | Payer: Medicare Other | Source: Ambulatory Visit | Attending: Internal Medicine | Admitting: Internal Medicine

## 2010-12-31 DIAGNOSIS — M549 Dorsalgia, unspecified: Secondary | ICD-10-CM

## 2010-12-31 DIAGNOSIS — Z7901 Long term (current) use of anticoagulants: Secondary | ICD-10-CM

## 2010-12-31 DIAGNOSIS — Z96659 Presence of unspecified artificial knee joint: Secondary | ICD-10-CM

## 2011-01-09 NOTE — Discharge Summary (Signed)
[name]                 ACCOUNT NO.:  [id number]  MEDICAL RECORD NO.:  [id number]  LOCATION:  5038                         FACILITY:  [facility]  PHYSICIAN:  [name], M.D.   DATE OF BIRTH:  1930-09-17  DATE OF ADMISSION:  11/16/2010 DATE OF DISCHARGE:  11/19/2010                              DISCHARGE SUMMARY   ADMITTING DIAGNOSES: 1. End-stage degenerative joint disease, right knee. 2. Hypertension. 3. Gastroesophageal reflux disease. 4. Chronic anemia.  DISCHARGE DIAGNOSES: 1. End-stage degenerative joint disease, right knee. 2. Hypertension. 3. Gastroesophageal reflux disease. 4. Chronic anemia. 5. Acute blood loss anemia.  PROCEDURES IN HOSPITAL:  Right total knee arthroplasty, computer assisted, Anna Junior, MD, Nov 16, 2010.  BRIEF HISTORY:  [name] is a 77 year old female who works at [employer] in [employer].  She continues to work even at her age.  She has long history of right knee pain.  Weightbearing x-rays showed that she has bone-on- bone arthritis of the right knee.  She had night pain and pain with ambulation.  She had been treated with long-term pain medication, multiple cortisone injections, and modification of her activity.  Based upon her clinical and radiographic findings, she is felt to be a candidate for a right total knee replacement.  She was admitted for this.  PERTINENT LABORATORY STUDIES:  The patient's hemoglobin on admission was 12.7 with a hematocrit of 37 and her potassium was 4.7.  On postop day #1, her hemoglobin was 8.1 with hematocrit of 23.7.  On postop day #2, her hemoglobin was 9.7.  on postop day #3, her hemoglobin was 10.1.  Her ProTime on admission was 13.9 seconds and INR of 1.05.  On the day of discharge, her ProTime was 17.1 seconds and INR of 1.37.  BMET on admission showed normal potassium of 4.0.  BUN and creatinine are within normal range with a slightly elevated glucose at 134.  EGFR on Nov 17, 2010, was 57  and then was greater than 60 on Nov 18, 2010.  Two units of packed RBCs were transfused on Nov 17, 2010.  HOSPITAL COURSE:  The patient was brought to the operating room on Nov 16, 2010, where she underwent right total knee replacement as well described in Dr. Luiz Blare' operative note, this is a computer assisted. Preoperatively, she was given a g of Ancef and 80 mg of IV gentamicin prophylactically.  Postoperatively, she was given a g of Ancef q.8 hours x3 doses.  Foley catheter was placed at the time of surgery. Postoperatively, IV fluids along with a PCA morphine pump were ordered. Physical therapy is ordered for walker ambulation, weightbearing as tolerated on the right side.  CPM machine was used for right knee range of motion.  She was started on Coumadin for DVT prophylaxis.  On postop day #1, the patient was overall doing well, was without complaints, but did have complaints of knee pain.  She is using the PCA morphine pump for pain control.  Her hemoglobin was low and 2 units of packed RBCs were transfused for her acute blood loss anemia.  On postop day #2, she was doing better.  Her right knee dressing  was changed.  Her Hemovac dressing was pulled.  She was continued with physical therapy.  She was continued on Coumadin for DVT prophylaxis.  On postop day #3, she felt well.  She had some complaints of right-sided sciatica.  Her vital signs were stable.  She was afebrile.  Her right knee dressing was clean and dry.  She was discharged home after being seen with Physical Therapy. She was discharged home in improved condition.  She will be on a regular diet.  Activity status will be weightbearing as tolerated on the right. She will need Home Health Physical Therapy and Home Health RN for ProTime and Coumadin management, told she use 1 month postop for DVT prophylaxis.  DISCHARGE MEDICATIONS: 1. Vitamin B6 1 daily. 2. Vitamin B12 1 daily. 3. Artificial tears 1 drop both eyes  p.r.n. 4. Multivitamins 1 daily. 5. Allegra 180 mg 1 daily as needed. 6. Ferrous sulfate 65 mg 1 daily. 7. Klor-Con 20 mEq 1 daily times p.r.n. 8. Nexium 40 mg 1 daily. 9. Benicar HCT 1 daily. 10.Verapamil 240 mg SR 1 daily. 11.Percocet 5 mg 1-2 q.6 hours p.r.n. pain. 12.Robaxin 500 mg 1 every 8 hours as needed for spasm. 13.Coumadin as directed x1 month.  She will follow up with Dr. Luiz Blare in the office in 2 weeks or sooner if any problems occur.     Marshia Ly, P.A.   ______________________________ [name], M.D.    JB/MEDQ  D:  01/04/2011  T:  01/05/2011  Job:  [id number]  cc:   Stacie Glaze, MD  Electronically Signed by Marshia Ly P.A. on 01/09/2011 04:44:41 PM Electronically Signed by Jodi Geralds M.D. on 01/09/2011 09:09:19 PM

## 2011-01-25 ENCOUNTER — Other Ambulatory Visit: Payer: Self-pay | Admitting: Internal Medicine

## 2011-02-08 ENCOUNTER — Ambulatory Visit: Payer: Medicare Other | Admitting: Internal Medicine

## 2011-02-22 ENCOUNTER — Ambulatory Visit (INDEPENDENT_AMBULATORY_CARE_PROVIDER_SITE_OTHER): Payer: Medicare Other | Admitting: Internal Medicine

## 2011-02-22 VITALS — BP 160/84 | HR 80 | Temp 98.2°F | Resp 16 | Ht 67.0 in | Wt 172.0 lb

## 2011-02-22 DIAGNOSIS — I10 Essential (primary) hypertension: Secondary | ICD-10-CM

## 2011-02-22 DIAGNOSIS — T887XXA Unspecified adverse effect of drug or medicament, initial encounter: Secondary | ICD-10-CM

## 2011-02-22 DIAGNOSIS — M1710 Unilateral primary osteoarthritis, unspecified knee: Secondary | ICD-10-CM

## 2011-02-22 DIAGNOSIS — R05 Cough: Secondary | ICD-10-CM

## 2011-02-22 MED ORDER — HYDRALAZINE HCL 50 MG PO TABS: 50.0000 mg | ORAL_TABLET | Freq: Three times a day (TID) | ORAL | Status: DC

## 2011-02-22 MED ORDER — PREDNISOLONE ACETATE 1 % OP SUSP: 1.0000 [drp] | Freq: Four times a day (QID) | OPHTHALMIC | Status: DC

## 2011-02-22 MED ORDER — FEXOFENADINE HCL 180 MG PO TABS
180.0000 mg | ORAL_TABLET | Freq: Every day | ORAL | Status: DC
Start: 2011-02-22 — End: 2011-04-29

## 2011-02-22 NOTE — Progress Notes (Signed)
  Subjective:    Patient ID: [name], female    DOB: 12-29-1930, 77 y.o.   MRN: [id number]  HPI  The patient had resumed Benicar because her blood pressure was elevated and of note her cough has worsened both in her perception my perception she is having difficulty coughing when she was here last time off the Benicar she was not coughing to the degree she is now.  I suspect that she may have an allergic cough reaction to that class of medication were to have to find a different medication to control her blood pressure  Review of Systems  Constitutional: Negative for activity change, appetite change and fatigue.  HENT: Negative for ear pain, congestion, neck pain, postnasal drip and sinus pressure.   Eyes: Negative for redness and visual disturbance.  Respiratory: Negative for cough, shortness of breath and wheezing.   Gastrointestinal: Negative for abdominal pain and abdominal distention.  Genitourinary: Negative for dysuria, frequency and menstrual problem.  Musculoskeletal: Negative for myalgias, joint swelling and arthralgias.  Skin: Negative for rash and wound.  Neurological: Negative for dizziness, weakness and headaches.  Hematological: Negative for adenopathy. Does not bruise/bleed easily.  Psychiatric/Behavioral: Negative for sleep disturbance and decreased concentration.       Objective:   Physical Exam  Nursing note and vitals reviewed. Constitutional: She is oriented to person, place, and time. She appears well-developed and well-nourished. No distress.  HENT:  Head: Normocephalic and atraumatic.  Right Ear: External ear normal.  Left Ear: External ear normal.  Nose: Nose normal.  Mouth/Throat: Oropharynx is clear and moist.  Eyes: Conjunctivae and EOM are normal. Pupils are equal, round, and reactive to light.  Neck: Normal range of motion. Neck supple. No JVD present. No tracheal deviation present. No thyromegaly present.  Cardiovascular: Normal rate, regular  rhythm, normal heart sounds and intact distal pulses.   No murmur heard. Pulmonary/Chest: Effort normal and breath sounds normal. She has no wheezes. She exhibits no tenderness.  Abdominal: Soft. Bowel sounds are normal.  Musculoskeletal: Normal range of motion. She exhibits no edema and no tenderness.  Lymphadenopathy:    She has no cervical adenopathy.  Neurological: She is alert and oriented to person, place, and time. She has normal reflexes. No cranial nerve deficit.  Skin: Skin is warm and dry. She is not diaphoretic.  Psychiatric: She has a normal mood and affect. Her behavior is normal.          Assessment & Plan:  Chronic cough This may be due to the ARB, she stopped the medication and the cough improved dramatically, then when she resumed. The Benicar her cough returned.  We will stop the Benicar and replace it with a beta blocker diuretic combination and monitor her in 2 months.  I've given her samples of Compazine to control her cough in the interim until the ARB is cleared from her system.  Otherwise she has successfully had one knee replacement surgery and will require an additional knee replacement surgery in the next months she is ambulating better but still has significant pain on the knee that has not been operated on yet. The

## 2011-02-22 NOTE — Patient Instructions (Signed)
Stop the Benicar and start the hydralazine 3 times a day for your blood pressure continue the verapamil

## 2011-02-24 ENCOUNTER — Encounter: Payer: Self-pay | Admitting: Internal Medicine

## 2011-03-01 ENCOUNTER — Other Ambulatory Visit: Payer: Self-pay | Admitting: Internal Medicine

## 2011-03-04 ENCOUNTER — Telehealth: Payer: Self-pay | Admitting: *Deleted

## 2011-03-04 NOTE — Telephone Encounter (Signed)
Change of blood pressure medicine due to her cough and expected to be a little bit elevated until the blood pressure medicine fully kicks in keep a monitor on her blood pressure medicine he climbed above 170 let us know but see if it will gradually come down. We may have to increase the new blood pressure medication slightly but I would like to give it at least 3 weeks of being on the medication before we titrate it

## 2011-03-04 NOTE — Telephone Encounter (Signed)
Pt states she is feeling dizzy???  Took BP and it is 164/67, and is worried it may be the new BP meds??? [facility]

## 2011-03-04 NOTE — Telephone Encounter (Signed)
Pt. Notified.

## 2011-03-07 ENCOUNTER — Telehealth: Payer: Self-pay | Admitting: *Deleted

## 2011-03-07 MED ORDER — AMLODIPINE BESYLATE 5 MG PO TABS: 5.0000 mg | ORAL_TABLET | Freq: Every day | ORAL | Status: DC

## 2011-03-07 NOTE — Telephone Encounter (Addendum)
Add Norvasc 5 mg. One po daily..  BPs have been running 177/83.  Notified pt.

## 2011-03-26 ENCOUNTER — Telehealth: Payer: Self-pay | Admitting: Internal Medicine

## 2011-03-26 ENCOUNTER — Emergency Department (HOSPITAL_COMMUNITY)
Admission: EM | Admit: 2011-03-26 | Discharge: 2011-03-26 | Disposition: A | Discharge status: Home / Self Care | Payer: Medicare Other | Attending: Emergency Medicine | Admitting: Emergency Medicine

## 2011-03-26 DIAGNOSIS — Z79899 Other long term (current) drug therapy: Secondary | ICD-10-CM | POA: Insufficient documentation

## 2011-03-26 DIAGNOSIS — I10 Essential (primary) hypertension: Secondary | ICD-10-CM | POA: Insufficient documentation

## 2011-03-26 DIAGNOSIS — M129 Arthropathy, unspecified: Secondary | ICD-10-CM | POA: Insufficient documentation

## 2011-03-26 DIAGNOSIS — K219 Gastro-esophageal reflux disease without esophagitis: Secondary | ICD-10-CM | POA: Insufficient documentation

## 2011-03-26 LAB — BASIC METABOLIC PANEL
BUN: 18 mg/dL (ref 6–23)
Chloride: 101 mEq/L (ref 96–112)
Glucose, Bld: 94 mg/dL (ref 70–99)
Potassium: 3.9 mEq/L (ref 3.5–5.1)

## 2011-03-26 NOTE — Telephone Encounter (Signed)
Ov gi ven for am at 9:30-pt aware

## 2011-03-26 NOTE — Telephone Encounter (Signed)
Pt stated doc at [facility] told her to fup with doc jenkins in 3 day. Pt decline to see another MD. Pt was dx with elevated blood pressure. Please advise

## 2011-03-27 ENCOUNTER — Encounter: Payer: Self-pay | Admitting: Internal Medicine

## 2011-03-27 ENCOUNTER — Ambulatory Visit (INDEPENDENT_AMBULATORY_CARE_PROVIDER_SITE_OTHER): Payer: Medicare Other | Admitting: Internal Medicine

## 2011-03-27 VITALS — BP 158/80 | HR 120 | Temp 98.2°F | Resp 16 | Ht 67.0 in | Wt 168.0 lb

## 2011-03-27 DIAGNOSIS — Z23 Encounter for immunization: Secondary | ICD-10-CM

## 2011-03-27 DIAGNOSIS — F419 Anxiety disorder, unspecified: Secondary | ICD-10-CM

## 2011-03-27 DIAGNOSIS — R030 Elevated blood-pressure reading, without diagnosis of hypertension: Secondary | ICD-10-CM

## 2011-03-27 DIAGNOSIS — F411 Generalized anxiety disorder: Secondary | ICD-10-CM

## 2011-03-27 DIAGNOSIS — I10 Essential (primary) hypertension: Secondary | ICD-10-CM

## 2011-03-27 DIAGNOSIS — R51 Headache: Secondary | ICD-10-CM

## 2011-03-27 MED ORDER — CLONIDINE HCL 0.2 MG/24HR TD PTWK: 1.0000 | MEDICATED_PATCH | TRANSDERMAL | Status: DC

## 2011-03-27 NOTE — Patient Instructions (Addendum)
You are going to stop the hydralazine and tonight when you get home you will place on the new clonidine patch this states in place for a week to replace it every week it will very smoothly control your blood pressure expect her blood pressure to be a little bit high for the first 24 hours and then come down if the blood pressure drops too low we may even be able to get off the verapamil.

## 2011-03-27 NOTE — Progress Notes (Signed)
Subjective:    Patient ID: [name], female    DOB: 1930/09/23, 77 y.o.   MRN: [id number]  HPI Patient is 77 year old American female followed for hypertension.  She has had episodes of marked elevated blood pressure since she has been converted to hydralazine.  She was on ACE inhibitors and ARB and had developed a cough the cough seems to have extinguished off these medications. Blood pressures been difficult to control both blood pressure was evenly controlled while she was on Benicar.  Seen in the emergency room for elevated blood pressure of one missed dose of the hydralazine.  We think that even this patient the medication may be better answered we discussed use of clonidine patches.  We used a 0.2 patch to control her blood pressure... we discussed the use of the patch ands he might actually be able to also stop using verapamil   Review of Systems  Constitutional: Negative for activity change, appetite change and fatigue.  HENT: Negative for ear pain, congestion, neck pain, postnasal drip and sinus pressure.   Eyes: Negative for redness and visual disturbance.  Respiratory: Negative for cough, shortness of breath and wheezing.   Gastrointestinal: Negative for abdominal pain and abdominal distention.  Genitourinary: Negative for dysuria, frequency and menstrual problem.  Musculoskeletal: Negative for myalgias, joint swelling and arthralgias.  Skin: Negative for rash and wound.  Neurological: Negative for dizziness, weakness and headaches.  Hematological: Negative for adenopathy. Does not bruise/bleed easily.  Psychiatric/Behavioral: Negative for sleep disturbance and decreased concentration.   Past Medical History  Diagnosis Date  . PUD (peptic ulcer disease)   . Dyskinesia of esophagus   . Unspecified essential hypertension   . Esophageal reflux    Past Surgical History  Procedure Date  . Abdominal hysterectomy   . Rotator cuff repair   . Oophorectomy   .  Cataracts     reports that she quit smoking about 20 years ago. She does not have any smokeless tobacco history on file. She reports that she does not drink alcohol or use illicit drugs. family history includes Diabetes in her mother. Allergies  Allergen Reactions  . Nsaids Other (See Comments)    REACTION: intolerant due to reflux, history of ulcers       Objective:   Physical Exam  Nursing note and vitals reviewed. Constitutional: She is oriented to person, place, and time. She appears well-developed and well-nourished. No distress.  HENT:  Head: Normocephalic and atraumatic.  Right Ear: External ear normal.  Left Ear: External ear normal.  Nose: Nose normal.  Mouth/Throat: Oropharynx is clear and moist.  Eyes: Conjunctivae and EOM are normal. Pupils are equal, round, and reactive to light.  Neck: Normal range of motion. Neck supple. No JVD present. No tracheal deviation present. No thyromegaly present.  Cardiovascular: Normal rate, regular rhythm, normal heart sounds and intact distal pulses.   No murmur heard. Pulmonary/Chest: Effort normal and breath sounds normal. She has no wheezes. She exhibits no tenderness.  Abdominal: Soft. Bowel sounds are normal.  Musculoskeletal: Normal range of motion. She exhibits no edema and no tenderness.  Lymphadenopathy:    She has no cervical adenopathy.  Neurological: She is alert and oriented to person, place, and time. She has normal reflexes. No cranial nerve deficit.  Skin: Skin is warm and dry. She is not diaphoretic.  Psychiatric: She has a normal mood and affect. Her behavior is normal.          Assessment & Plan:  Poorly  controlled blood pressure off ARB due to cough cough has resolved.  P. Will change her from hydralazine because of fluctuations in her blood pressure to a clonidine patch with a central acting agent may be more smooth and also help her with her anxiety over her fluctuating blood pressure.  The elevated blood  pressure today is multifactorial and she was anxious she has had intermittent headaches and the timing of her hydralazine dose is problematic.

## 2011-04-04 ENCOUNTER — Telehealth: Payer: Self-pay | Admitting: Internal Medicine

## 2011-04-04 MED ORDER — HYDROCODONE-HOMATROPINE 5-1.5 MG/5ML PO SYRP: 5.0000 mL | ORAL_SOLUTION | Freq: Four times a day (QID) | ORAL | Status: AC | PRN

## 2011-04-04 NOTE — Telephone Encounter (Signed)
Pharmacist called req refill of pt Hydromet Sryp.

## 2011-04-17 ENCOUNTER — Other Ambulatory Visit: Payer: Self-pay | Admitting: Internal Medicine

## 2011-04-29 ENCOUNTER — Telehealth: Payer: Self-pay | Admitting: *Deleted

## 2011-04-29 ENCOUNTER — Emergency Department (HOSPITAL_COMMUNITY)
Admission: EM | Admit: 2011-04-29 | Discharge: 2011-04-30 | Discharge status: Against Medical Advice | Payer: Medicare Other | Attending: Emergency Medicine | Admitting: Emergency Medicine

## 2011-04-29 ENCOUNTER — Emergency Department (HOSPITAL_COMMUNITY): Admission: EM | Admit: 2011-04-29 | Discharge: 2011-04-29 | Discharge status: Absent without leave | Payer: Self-pay

## 2011-04-29 ENCOUNTER — Encounter (HOSPITAL_COMMUNITY): Payer: Self-pay | Admitting: *Deleted

## 2011-04-29 DIAGNOSIS — I10 Essential (primary) hypertension: Secondary | ICD-10-CM | POA: Insufficient documentation

## 2011-04-29 NOTE — ED Notes (Signed)
Her bp has been going up and down for approx 5 months.  Today she feels strange and after she came home from work she checked her bo and it was high.  Her bp med has been changed x2 since may

## 2011-04-29 NOTE — ED Notes (Signed)
Pt left AMA, signed AMA form

## 2011-04-29 NOTE — Telephone Encounter (Signed)
Per dr Lovell Sheehan - if bp is that high she needs to go to er - if not could cause stroke- pt informed

## 2011-04-29 NOTE — Telephone Encounter (Signed)
Pt just took BP and it was 200/97.  Wants to see Dr. Lovell Sheehan in the am.

## 2011-05-03 ENCOUNTER — Other Ambulatory Visit: Payer: Self-pay | Admitting: *Deleted

## 2011-05-03 ENCOUNTER — Ambulatory Visit (INDEPENDENT_AMBULATORY_CARE_PROVIDER_SITE_OTHER): Payer: Medicare Other | Admitting: Internal Medicine

## 2011-05-03 ENCOUNTER — Encounter: Payer: Self-pay | Admitting: Internal Medicine

## 2011-05-03 VITALS — BP 184/94 | HR 80 | Temp 98.2°F | Resp 16 | Ht 67.0 in | Wt 168.0 lb

## 2011-05-03 DIAGNOSIS — I10 Essential (primary) hypertension: Secondary | ICD-10-CM

## 2011-05-03 DIAGNOSIS — R05 Cough: Secondary | ICD-10-CM

## 2011-05-03 DIAGNOSIS — K5909 Other constipation: Secondary | ICD-10-CM

## 2011-05-03 DIAGNOSIS — K224 Dyskinesia of esophagus: Secondary | ICD-10-CM

## 2011-05-03 MED ORDER — MEPROBAMATE 200 MG PO TABS: 200.0000 mg | ORAL_TABLET | Freq: Three times a day (TID) | ORAL | Status: AC | PRN

## 2011-05-03 MED ORDER — MEPROBAMATE-ASPIRIN 200-325 MG PO TABS: 1.0000 | ORAL_TABLET | Freq: Three times a day (TID) | ORAL | Status: DC

## 2011-05-03 MED ORDER — OLMESARTAN-AMLODIPINE-HCTZ 40-5-25 MG PO TABS: 1.0000 | ORAL_TABLET | Freq: Every day | ORAL | Status: DC

## 2011-05-03 NOTE — Patient Instructions (Signed)
Keep the patch on until tomorrow morning Tonight take one of the TRIBENZOR  This combination medics and has Benicar 40, hydrochlorothiazide 25, and amlodipine 10 It replaces the clonidine the verapamil the amlodipine......Marland Kitchen just one pill Take one tonight and one in the morning and take one every morning

## 2011-05-03 NOTE — Progress Notes (Signed)
Subjective:    Patient ID: [name], female    DOB: [birth year], 77 y.o.   MRN: [id number]  HPI Patient is a previous responder to Benicar with hydrochlorothiazide but since we have changed her off this medication due to a chronic cough she has been unable to tolerate other medications. Specifically she has been on a clonidine patch and amlodipine, which he has not taken per instructions , and has not been able to have a sustainable control of her blood pressure.  When she was seen in the emergency room and given an additional dose of Klonopin her blood pressure did reduce and she was released but today she presents with a headache and elevated blood pressure.  She's been treated for reflux esophagitis and esophageal stricture this may be the primary etiology of the cough so we're willing to retry the Benicar since she has such good blood pressure control without medication   Review of Systems  Constitutional: Negative for activity change, appetite change and fatigue.  HENT: Negative for ear pain, congestion, neck pain, postnasal drip and sinus pressure.   Eyes: Negative for redness and visual disturbance.  Respiratory: Negative for cough, shortness of breath and wheezing.   Gastrointestinal: Negative for abdominal pain and abdominal distention.  Genitourinary: Negative for dysuria, frequency and menstrual problem.  Musculoskeletal: Negative for myalgias, joint swelling and arthralgias.  Skin: Negative for rash and wound.  Neurological: Positive for headaches. Negative for dizziness and weakness.  Hematological: Negative for adenopathy. Does not bruise/bleed easily.  Psychiatric/Behavioral: Negative for sleep disturbance and decreased concentration.   Past Medical History  Diagnosis Date  . PUD (peptic ulcer disease)   . Dyskinesia of esophagus   . Unspecified essential hypertension   . Esophageal reflux    Past Surgical History  Procedure Date  . Abdominal hysterectomy   .  Rotator cuff repair   . Oophorectomy   . Cataracts     reports that she quit smoking about 20 years ago. She does not have any smokeless tobacco history on file. She reports that she does not drink alcohol or use illicit drugs. family history includes Diabetes in her mother. Allergies  Allergen Reactions  . Nsaids Other (See Comments)    REACTION: intolerant due to reflux, history of ulcers       Objective:   Physical Exam  Nursing note and vitals reviewed. Constitutional: She is oriented to person, place, and time. She appears well-developed and well-nourished. No distress.  HENT:  Head: Normocephalic and atraumatic.  Right Ear: External ear normal.  Left Ear: External ear normal.  Nose: Nose normal.  Mouth/Throat: Oropharynx is clear and moist.  Eyes: Conjunctivae and EOM are normal. Pupils are equal, round, and reactive to light.  Neck: Normal range of motion. Neck supple. No JVD present. No tracheal deviation present. No thyromegaly present.  Cardiovascular: Normal rate, regular rhythm, normal heart sounds and intact distal pulses.   No murmur heard. Pulmonary/Chest: Effort normal and breath sounds normal. She has no wheezes. She exhibits no tenderness.  Abdominal: Soft. Bowel sounds are normal.  Musculoskeletal: Normal range of motion. She exhibits no edema and no tenderness.  Lymphadenopathy:    She has no cervical adenopathy.  Neurological: She is alert and oriented to person, place, and time. She has normal reflexes. No cranial nerve deficit.  Skin: Skin is warm and dry. She is not diaphoretic.  Psychiatric: She has a normal mood and affect. Her behavior is normal.  Assessment & Plan:  The patient is difficult to control blood pressure with multiple emergency room visits due to to her elevated blood pressure we will resume the Benicar in conjunction with amlodipine and hydrochlorothiazide and we'll monitor her carefully over the next 3 weeks she's been given  careful instructions about her medications we reviewed the results of laboratory values obtained in the emergency room and believes this is the best course for her for her fundus risks and benefits of this therapy and given instructions to keep close watch on her blood pressure and call as her blood pressure does not respond to this regimen she is given instructions about Saturday clinic he presented to worsen and or the emergency room if she has severe headache or neurologic changes or intractable blood pressure

## 2011-05-20 ENCOUNTER — Telehealth: Payer: Self-pay | Admitting: Internal Medicine

## 2011-05-20 ENCOUNTER — Telehealth: Payer: Self-pay | Admitting: Family Medicine

## 2011-05-20 ENCOUNTER — Other Ambulatory Visit: Payer: Self-pay | Admitting: Internal Medicine

## 2011-05-20 DIAGNOSIS — K219 Gastro-esophageal reflux disease without esophagitis: Secondary | ICD-10-CM

## 2011-05-20 NOTE — Telephone Encounter (Signed)
Offered pt an appointment tomorrow with dr Noland Fordyce states she is seeing dr Juanda Chance tomorrow for a scope-will keep Friday appointment

## 2011-05-20 NOTE — Telephone Encounter (Signed)
Pt with hx of Gastric Ulcer, H. Pylori, GERD, Esophageal Stricture with Dilation in 2009, HH, another EGD 08/2008 revealing Gastric Ulcer, last COLON 2008 with mild Diverticulitis, IBS with Predominant Constipation and intermittent low volume rectal leakage. Last OV 06/04/10 for BRB on tissue, abdominal pain and nausea. Pt was given Dexilant to take qhs with Nexium qam , Anusol suppositories for the bleeding and Carafate slurry BID and PRN. Pt had a knee replacement in May, 2012 and her BP meds have been changed 3 times since then. She reports since May, she has had trouble eating. She can eat and it comes back up or she has no appetite. She has been to the ER x2 for her BP and was started on Tribenzor on 05/03/11 by Dr Lovell Sheehan. She also reports abdominal pain when she eats and remains on Nexium, Dr Juanda Chance, please advise.

## 2011-05-20 NOTE — Telephone Encounter (Signed)
I have spoken to the pt and will proceed with EGD tomorrow to r/o recurrent ulcer. She is still on Lodine. Please schedule

## 2011-05-20 NOTE — Telephone Encounter (Signed)
I have spoken to patient and have scheduled for for an EGD with propofol (per Dr Juanda Chance) for 05/21/11 @ 4:00 pm. I have advised her to arrive at 3:00 pm on 05/21/11. I have also advised her to be on clear liquids only up until 2:00 pm on 05/21/11 (I have given her examples of clear liquids). I have also asked that she have someone with her 18 years or older for the entire procedure since she will be sedated. She verbalizes understanding of all of the above.

## 2011-05-20 NOTE — Telephone Encounter (Signed)
Bonnye - pulled off Triage vmail. Pt states she has appt with Lovell Sheehan on Fri, but has been unable to eat, is vomiting, and has lost weight - this has been on & off for 2wks. Wants to know if he can see sooner, or if there is some medication she can try. Please advise.

## 2011-05-21 ENCOUNTER — Ambulatory Visit: Payer: Medicare Other | Admitting: Internal Medicine

## 2011-05-21 ENCOUNTER — Encounter: Payer: Self-pay | Admitting: Internal Medicine

## 2011-05-21 ENCOUNTER — Ambulatory Visit (AMBULATORY_SURGERY_CENTER): Payer: Medicare Other | Admitting: Internal Medicine

## 2011-05-21 VITALS — BP 125/64 | HR 80 | Resp 18 | Ht 67.0 in | Wt 168.0 lb

## 2011-05-21 DIAGNOSIS — R1013 Epigastric pain: Secondary | ICD-10-CM

## 2011-05-21 DIAGNOSIS — K2940 Chronic atrophic gastritis without bleeding: Secondary | ICD-10-CM

## 2011-05-21 DIAGNOSIS — K222 Esophageal obstruction: Secondary | ICD-10-CM

## 2011-05-21 DIAGNOSIS — K219 Gastro-esophageal reflux disease without esophagitis: Secondary | ICD-10-CM

## 2011-05-21 MED ORDER — SODIUM CHLORIDE 0.9 % IV SOLN: 500.0000 mL | INTRAVENOUS | Status: DC

## 2011-05-21 MED ORDER — PROMETHAZINE HCL 12.5 MG PO TABS: 12.5000 mg | ORAL_TABLET | Freq: Four times a day (QID) | ORAL | Status: DC | PRN

## 2011-05-21 NOTE — Patient Instructions (Addendum)
RESUME ALL MEDICATIONS EXCEPT FOR NSAIDS. DILATION PROTOCOL GIVEN.D/C Instructions given.

## 2011-05-21 NOTE — Progress Notes (Signed)
Patient did not experience any of the following events: a burn prior to discharge; a fall within the facility; wrong site/side/patient/procedure/implant event; or a hospital transfer or hospital admission upon discharge from the facility. (G8907) Patient did not have preoperative order for IV antibiotic SSI prophylaxis. (G8918) Patient did not have preoperative order for IV antibiotic SSI prophylaxis. (G8918)  

## 2011-05-22 ENCOUNTER — Other Ambulatory Visit: Payer: Self-pay | Admitting: Internal Medicine

## 2011-05-22 ENCOUNTER — Telehealth: Payer: Self-pay | Admitting: *Deleted

## 2011-05-22 NOTE — Telephone Encounter (Signed)
Follow up Call- Patient questions:  Do you have a fever, pain , or abdominal swelling? no Pain Score  0 *  Have you tolerated food without any problems? yes  Have you been able to return to your normal activities? yes  Do you have any questions about your discharge instructions: Diet   no Medications  no Follow up visit  no  Do you have questions or concerns about your Care? no  Actions: * If pain score is 4 or above: No action needed, pain <4.  Patient requesting in admitting to call daughter, Zella Ball stating patient did well, only drinking Gateraide last evening. Patient going to work this morning.

## 2011-05-24 ENCOUNTER — Encounter: Payer: Self-pay | Admitting: Internal Medicine

## 2011-05-24 ENCOUNTER — Ambulatory Visit (INDEPENDENT_AMBULATORY_CARE_PROVIDER_SITE_OTHER): Payer: Medicare Other | Admitting: Internal Medicine

## 2011-05-24 VITALS — BP 110/60 | HR 96 | Temp 98.6°F | Resp 16 | Ht 67.0 in | Wt 165.0 lb

## 2011-05-24 DIAGNOSIS — K222 Esophageal obstruction: Secondary | ICD-10-CM

## 2011-05-24 DIAGNOSIS — T887XXA Unspecified adverse effect of drug or medicament, initial encounter: Secondary | ICD-10-CM

## 2011-05-24 DIAGNOSIS — I10 Essential (primary) hypertension: Secondary | ICD-10-CM

## 2011-05-24 DIAGNOSIS — K219 Gastro-esophageal reflux disease without esophagitis: Secondary | ICD-10-CM

## 2011-05-24 MED ORDER — APAP-MGSALICY-PHENYLTOLOX-CAFF 500-500-20-50 MG PO TABS: 1.0000 | ORAL_TABLET | Freq: Three times a day (TID) | ORAL | Status: DC | PRN

## 2011-05-24 NOTE — Patient Instructions (Signed)
Change the  tribenzor to 40/5/12.5

## 2011-05-26 NOTE — Progress Notes (Signed)
Subjective:    Patient ID: [name], female    DOB: 1931/06/10, 77 y.o.   MRN: [id number]  HPI Patient presents for difficult to control hypertension she was on a number of antihypertensive medications her blood pressure was poorly controlled she had variable elevations of her blood pressure into the 200 range her risk of stroke is high secondary to her age family history and history of hypertension.  She was changed back to a combination of a ARB, CCB and a diuretic with excellent response.  She has had some episodes of palpitations indicating possible dehydration she states that she's had mild to moderate nausea but no diarrhea no shortness of breath or chest pain   Review of Systems  Constitutional: Negative for activity change, appetite change and fatigue.  HENT: Negative for ear pain, congestion, neck pain, postnasal drip and sinus pressure.   Eyes: Negative for redness and visual disturbance.  Respiratory: Negative for cough, shortness of breath and wheezing.   Gastrointestinal: Positive for nausea. Negative for abdominal pain and abdominal distention.  Genitourinary: Negative for dysuria, frequency and menstrual problem.  Musculoskeletal: Negative for myalgias, joint swelling and arthralgias.  Skin: Negative for rash and wound.  Neurological: Negative for dizziness, weakness and headaches.  Hematological: Negative for adenopathy. Does not bruise/bleed easily.  Psychiatric/Behavioral: Negative for sleep disturbance and decreased concentration.   Past Medical History  Diagnosis Date  . PUD (peptic ulcer disease)   . Dyskinesia of esophagus   . Unspecified essential hypertension   . Esophageal reflux   . Anemia   . Anxiety   . Arthritis   . Blood transfusion   . Cataract     History   Social History  . Marital Status: Single    Spouse Name: N/A    Number of Children: N/A  . Years of Education: N/A   Occupational History  . Glass blower/designer   Social History Main  Topics  . Smoking status: Former Smoker    Quit date: 06/24/1990  . Smokeless tobacco: Never Used   Comment: quit 1985  . Alcohol Use: No  . Drug Use: No  . Sexually Active: Not Currently   Other Topics Concern  . Not on file   Social History Narrative  . No narrative on file    Past Surgical History  Procedure Date  . Abdominal hysterectomy   . Rotator cuff repair   . Oophorectomy   . Cataracts   . Right knee replacement     Family History  Problem Relation Age of Onset  . Diabetes Mother     Allergies  Allergen Reactions  . Nsaids Other (See Comments)    REACTION: intolerant due to reflux, history of ulcers    Current Outpatient Prescriptions on File Prior to Visit  Medication Sig Dispense Refill  . ALPRAZolam (XANAX) 0.5 MG tablet Take 1 tablet (0.5 mg total) by mouth at bedtime as needed for anxiety (for anxiety).  30 tablet  6  . amitriptyline (ELAVIL) 25 MG tablet Take 25 mg by mouth at bedtime.        . B Complex-C-Folic Acid (MULTIVITAMIN, STRESS FORMULA) tablet Take 1 tablet by mouth daily.        . chlorpheniramine-HYDROcodone (TUSSIONEX) 10-8 MG/5ML LQCR Take 5 mLs by mouth every 12 (twelve) hours as needed. For cough       . desoximetasone (TOPICORT) 0.25 % cream Apply topically. Apply to rash prn        . DULoxetine (CYMBALTA) 60  MG capsule Take 60 mg by mouth daily.        Marland Kitchen esomeprazole (NEXIUM) 40 MG capsule Take 40 mg by mouth daily before breakfast.        . etodolac (LODINE) 300 MG capsule TAKE 1 CAPSULE TWICE DAILY  60 capsule  3  . ferrous sulfate 324 (65 FE) MG TBEC Take by mouth 2 (two) times daily with a meal.       . Fesoterodine Fumarate (TOVIAZ) 8 MG TB24 Take by mouth daily.        . fexofenadine (ALLEGRA) 180 MG tablet Take 180 mg by mouth daily as needed. For allergies       . HYDROcodone-acetaminophen (VICODIN) 5-500 MG per tablet Take 1 tablet by mouth every 8 (eight) hours as needed. For pain      . HYPERCARE 20 % external solution  APPLY UNDER ARMS ONCE DAILY  60 mL  3  . KLOR-CON M20 20 MEQ tablet TAKE 1 TABLET EVERY DAY  30 tablet  5  . metoCLOPramide (REGLAN) 5 MG tablet Take 1 tablet (5 mg total) by mouth at bedtime.  30 tablet  11  . polyethylene glycol (MIRALAX) powder Take 17 g by mouth daily.        . prednisoLONE acetate (PRED FORTE) 1 % ophthalmic suspension Place 1 drop into both eyes 4 (four) times daily.        . promethazine (PHENERGAN) 12.5 MG tablet Take 1 tablet (12.5 mg total) by mouth every 6 (six) hours as needed for nausea.  30 tablet  1  . sennosides-docusate sodium (SENOKOT-S) 8.6-50 MG tablet Take 1 tablet by mouth daily.        . sucralfate (CARAFATE) 1 GM/10ML suspension Take 10 mLs (1 g total) by mouth 4 (four) times daily.  420 mL  0  . traMADol (ULTRAM) 50 MG tablet Take 50 mg by mouth every 6 (six) hours as needed. Maximum dose= 8 tablets per day       . verapamil (CALAN-SR) 240 MG CR tablet TAKE 1 TABLET BY MOUTH EVERY DAY  90 tablet  2    BP 110/60  Pulse 96  Temp 98.6 F (37 C)  Resp 16  Ht 5\' 7"  (1.702 m)  Wt 165 lb (74.844 kg)  BMI 25.84 kg/m2       Objective:   Physical Exam  Nursing note and vitals reviewed. Constitutional: She is oriented to person, place, and time. She appears well-developed and well-nourished. No distress.  HENT:  Head: Normocephalic and atraumatic.  Right Ear: External ear normal.  Left Ear: External ear normal.  Nose: Nose normal.  Mouth/Throat: Oropharynx is clear and moist.  Eyes: Conjunctivae and EOM are normal. Pupils are equal, round, and reactive to light.  Neck: Normal range of motion. Neck supple. No JVD present. No tracheal deviation present. No thyromegaly present.  Cardiovascular: Normal rate, regular rhythm, normal heart sounds and intact distal pulses.   No murmur heard. Pulmonary/Chest: Effort normal and breath sounds normal. She has no wheezes. She exhibits no tenderness.  Abdominal: Soft. Bowel sounds are normal.  Musculoskeletal:  Normal range of motion. She exhibits no edema and no tenderness.  Lymphadenopathy:    She has no cervical adenopathy.  Neurological: She is alert and oriented to person, place, and time. She has normal reflexes. No cranial nerve deficit.  Skin: Skin is warm and dry. She is not diaphoretic.  Psychiatric: She has a normal mood and affect. Her behavior is normal.  Assessment & Plan:  Patient's hypertension is now well-controlled I suspect that she has mild/moderate dehydration with the diuretic component.  We will decrease the diuretic component from 25-12 and half and monitor blood pressure carefully we may be able to discontinue the diuretic component and just use the CCP and the ARB.  She has a history of esophageal stricture and GERD we will monitor her symptomatology to make sure that this is not esophageal stricture recurrence.

## 2011-05-27 ENCOUNTER — Encounter: Payer: Self-pay | Admitting: Internal Medicine

## 2011-06-10 ENCOUNTER — Telehealth: Payer: Self-pay | Admitting: *Deleted

## 2011-06-10 ENCOUNTER — Other Ambulatory Visit: Payer: Self-pay | Admitting: Internal Medicine

## 2011-06-10 MED ORDER — HYDROCODONE-ACETAMINOPHEN 5-500 MG PO TABS: 1.0000 | ORAL_TABLET | Freq: Three times a day (TID) | ORAL | Status: DC | PRN

## 2011-06-10 NOTE — Telephone Encounter (Signed)
Thanks alicia

## 2011-06-10 NOTE — Telephone Encounter (Signed)
Ok  To call in [place]--done

## 2011-06-10 NOTE — Telephone Encounter (Signed)
She is may use extra tramadol up to 6 tablets a day

## 2011-06-10 NOTE — Telephone Encounter (Signed)
Pt states she has some of that but it is not strong enough.  Pt states she is going to get a shot in her back soon but needs something stronger for now.  Pls advise.

## 2011-06-10 NOTE — Telephone Encounter (Signed)
Pt wants some pain meds for sciatic pain.

## 2011-06-21 ENCOUNTER — Encounter: Payer: Self-pay | Admitting: Internal Medicine

## 2011-06-21 ENCOUNTER — Ambulatory Visit (INDEPENDENT_AMBULATORY_CARE_PROVIDER_SITE_OTHER): Payer: Medicare Other | Admitting: Internal Medicine

## 2011-06-21 VITALS — BP 150/80 | HR 88 | Temp 98.3°F | Resp 16 | Ht 67.0 in | Wt 164.0 lb

## 2011-06-21 DIAGNOSIS — K222 Esophageal obstruction: Secondary | ICD-10-CM

## 2011-06-21 DIAGNOSIS — K219 Gastro-esophageal reflux disease without esophagitis: Secondary | ICD-10-CM

## 2011-06-21 DIAGNOSIS — I10 Essential (primary) hypertension: Secondary | ICD-10-CM

## 2011-06-21 MED ORDER — OLMESARTAN-AMLODIPINE-HCTZ 40-5-12.5 MG PO TABS: 1.0000 | ORAL_TABLET | Freq: Every day | ORAL | Status: DC

## 2011-06-21 NOTE — Patient Instructions (Signed)
The patient is instructed to continue all medications as prescribed. Schedule followup with check out clerk upon leaving the clinic  

## 2011-06-21 NOTE — Progress Notes (Signed)
Subjective:    Patient ID: [name], female    DOB: [birth year], 77 y.o.   MRN: [id number]  HPI Patient is a 77 year old African American female who had poorly controlled blood pressure she is now on a triple blood pressure medicine with excellent control her initial blood pressure was 150/80 after rest of the office was 130/80.  She states she is tolerating the medication well and having no side effects   Review of Systems  Constitutional: Negative for activity change, appetite change and fatigue.  HENT: Negative for ear pain, congestion, neck pain, postnasal drip and sinus pressure.   Eyes: Negative for redness and visual disturbance.  Respiratory: Negative for cough, shortness of breath and wheezing.   Gastrointestinal: Negative for abdominal pain and abdominal distention.  Genitourinary: Negative for dysuria, frequency and menstrual problem.  Musculoskeletal: Negative for myalgias, joint swelling and arthralgias.  Skin: Negative for rash and wound.  Neurological: Negative for dizziness, weakness and headaches.  Hematological: Negative for adenopathy. Does not bruise/bleed easily.  Psychiatric/Behavioral: Negative for sleep disturbance and decreased concentration.   Past Medical History  Diagnosis Date  . PUD (peptic ulcer disease)   . Dyskinesia of esophagus   . Unspecified essential hypertension   . Esophageal reflux   . Anemia   . Anxiety   . Arthritis   . Blood transfusion   . Cataract     History   Social History  . Marital Status: Single    Spouse Name: N/A    Number of Children: N/A  . Years of Education: N/A   Occupational History  . Glass blower/designer   Social History Main Topics  . Smoking status: Former Smoker    Quit date: 06/24/1990  . Smokeless tobacco: Never Used   Comment: quit 1985  . Alcohol Use: No  . Drug Use: No  . Sexually Active: Not Currently   Other Topics Concern  . Not on file   Social History Narrative  . No narrative on file     Past Surgical History  Procedure Date  . Abdominal hysterectomy   . Rotator cuff repair   . Oophorectomy   . Cataracts   . Right knee replacement     Family History  Problem Relation Age of Onset  . Diabetes Mother     Allergies  Allergen Reactions  . Nsaids Other (See Comments)    REACTION: intolerant due to reflux, history of ulcers    Current Outpatient Prescriptions on File Prior to Visit  Medication Sig Dispense Refill  . ALPRAZolam (XANAX) 0.5 MG tablet Take 1 tablet (0.5 mg total) by mouth at bedtime as needed for anxiety (for anxiety).  30 tablet  6  . amitriptyline (ELAVIL) 25 MG tablet Take 25 mg by mouth at bedtime.        Marland Kitchen APAP-MgSalicy-Phenyltolox-Caff 500-500-20-50 MG TABS Take 1 tablet by mouth 3 (three) times daily with meals as needed.  30 each  5  . B Complex-C-Folic Acid (MULTIVITAMIN, STRESS FORMULA) tablet Take 1 tablet by mouth daily.        . chlorpheniramine-HYDROcodone (TUSSIONEX) 10-8 MG/5ML LQCR Take 5 mLs by mouth every 12 (twelve) hours as needed. For cough       . desoximetasone (TOPICORT) 0.25 % cream Apply topically. Apply to rash prn        . DULoxetine (CYMBALTA) 60 MG capsule Take 60 mg by mouth daily.        Marland Kitchen esomeprazole (NEXIUM) 40 MG capsule Take 40 mg  by mouth daily before breakfast.        . etodolac (LODINE) 300 MG capsule TAKE 1 CAPSULE TWICE DAILY  60 capsule  3  . ferrous sulfate 324 (65 FE) MG TBEC Take by mouth 2 (two) times daily with a meal.       . Fesoterodine Fumarate (TOVIAZ) 8 MG TB24 Take by mouth daily.        . fexofenadine (ALLEGRA) 180 MG tablet Take 180 mg by mouth daily as needed. For allergies       . HYDROcodone-acetaminophen (VICODIN) 5-500 MG per tablet Take 1 tablet by mouth every 8 (eight) hours as needed. For pain  30 tablet  0  . HYPERCARE 20 % external solution APPLY UNDER ARMS ONCE DAILY  60 mL  3  . KLOR-CON M20 20 MEQ tablet TAKE 1 TABLET EVERY DAY  30 tablet  5  . metoCLOPramide (REGLAN) 5 MG  tablet Take 1 tablet (5 mg total) by mouth at bedtime.  30 tablet  11  . polyethylene glycol (MIRALAX) powder Take 17 g by mouth daily.        . prednisoLONE acetate (PRED FORTE) 1 % ophthalmic suspension Place 1 drop into both eyes 4 (four) times daily.        . sennosides-docusate sodium (SENOKOT-S) 8.6-50 MG tablet Take 1 tablet by mouth daily.        . sucralfate (CARAFATE) 1 G tablet TAKE 1 TABLET (1 GRAM) 4 TIMES A DAY (BETWEEN MEALS AND AT BEDTIME)  120 tablet  0  . sucralfate (CARAFATE) 1 GM/10ML suspension Take 10 mLs (1 g total) by mouth 4 (four) times daily.  420 mL  0  . traMADol (ULTRAM) 50 MG tablet Take 50 mg by mouth every 6 (six) hours as needed. Maximum dose= 8 tablets per day         BP 150/80  Pulse 88  Temp 98.3 F (36.8 C)  Resp 16  Ht 5\' 7"  (1.702 m)  Wt 164 lb (74.39 kg)  BMI 25.69 kg/m2       Objective:   Physical Exam  Constitutional: She is oriented to person, place, and time. She appears well-developed and well-nourished. No distress.  HENT:  Head: Normocephalic and atraumatic.  Right Ear: External ear normal.  Left Ear: External ear normal.  Nose: Nose normal.  Mouth/Throat: Oropharynx is clear and moist.  Eyes: Conjunctivae and EOM are normal. Pupils are equal, round, and reactive to light.  Neck: Normal range of motion. Neck supple. No JVD present. No tracheal deviation present. No thyromegaly present.  Cardiovascular: Normal rate, regular rhythm, normal heart sounds and intact distal pulses.   No murmur heard. Pulmonary/Chest: Effort normal and breath sounds normal. She has no wheezes. She exhibits no tenderness.  Abdominal: Soft. Bowel sounds are normal.  Musculoskeletal: Normal range of motion. She exhibits no edema and no tenderness.  Lymphadenopathy:    She has no cervical adenopathy.  Neurological: She is alert and oriented to person, place, and time. She has normal reflexes. No cranial nerve deficit.  Skin: Skin is warm and dry. She is not  diaphoretic.  Psychiatric: She has a normal mood and affect. Her behavior is normal.          Assessment & Plan:  Stable blood pressure on current regimen continue current medicine prescription sent him samples of UTI given to the patient due to her history of esophageal stricture of the need for compliance

## 2011-07-02 ENCOUNTER — Other Ambulatory Visit: Payer: Self-pay | Admitting: *Deleted

## 2011-07-02 MED ORDER — ALPRAZOLAM 0.5 MG PO TABS: 0.5000 mg | ORAL_TABLET | Freq: Every evening | ORAL | Status: DC | PRN

## 2011-07-24 ENCOUNTER — Other Ambulatory Visit: Payer: Self-pay | Admitting: Internal Medicine

## 2011-08-12 ENCOUNTER — Other Ambulatory Visit: Payer: Self-pay | Admitting: Internal Medicine

## 2011-08-16 ENCOUNTER — Other Ambulatory Visit: Payer: Self-pay | Admitting: Internal Medicine

## 2011-08-23 ENCOUNTER — Ambulatory Visit (INDEPENDENT_AMBULATORY_CARE_PROVIDER_SITE_OTHER): Payer: Medicare Other | Admitting: Internal Medicine

## 2011-08-23 ENCOUNTER — Encounter: Payer: Self-pay | Admitting: Internal Medicine

## 2011-08-23 VITALS — BP 164/80 | HR 80 | Temp 98.2°F | Resp 16 | Ht 67.0 in | Wt 176.0 lb

## 2011-08-23 DIAGNOSIS — M542 Cervicalgia: Secondary | ICD-10-CM

## 2011-08-23 DIAGNOSIS — R110 Nausea: Secondary | ICD-10-CM

## 2011-08-23 DIAGNOSIS — K219 Gastro-esophageal reflux disease without esophagitis: Secondary | ICD-10-CM

## 2011-08-23 DIAGNOSIS — R05 Cough: Secondary | ICD-10-CM

## 2011-08-23 MED ORDER — METOCLOPRAMIDE HCL 5 MG PO TABS: 5.0000 mg | ORAL_TABLET | Freq: Three times a day (TID) | ORAL | Status: DC | PRN

## 2011-08-23 MED ORDER — TRAMADOL HCL 50 MG PO TABS
50.0000 mg | ORAL_TABLET | Freq: Four times a day (QID) | ORAL | Status: DC | PRN
Start: 2011-08-23 — End: 2011-10-17

## 2011-08-23 NOTE — Patient Instructions (Signed)
The Reglan he is a medication which helps your esophagus to move everything down and your stomach not to be bloated. It also helps with nausea and we believe should help with the cough if things are moving in the right direction and be sure to take the Reglan 3 times a day in the morning with supper and before bed

## 2011-08-23 NOTE — Progress Notes (Signed)
Subjective:    Patient ID: [name], female    DOB: 04/07/1931, 78 y.o.   MRN: [id number]  HPI Increased cough. Increased GERD symptoms and nausia. Patient has a long history of multifactorial cough which we have believed his primary due to GERD but complicated by allergies and chronic asthma.  He has noticed significant increased postnasal drip mild worsening of her GERD and significant increase in her cough.  Due to the cough she has significant left-sided neck pain that appears to be musculoskeletal in etiology   Review of Systems  Constitutional: Negative for activity change, appetite change and fatigue.  HENT: Positive for congestion, rhinorrhea and postnasal drip. Negative for ear pain, neck pain and sinus pressure.   Eyes: Negative for redness and visual disturbance.  Respiratory: Positive for cough and choking. Negative for shortness of breath and wheezing.   Gastrointestinal: Negative for abdominal pain and abdominal distention.  Genitourinary: Negative for dysuria, frequency and menstrual problem.  Musculoskeletal: Negative for myalgias, joint swelling and arthralgias.  Skin: Negative for rash and wound.  Neurological: Negative for dizziness, weakness and headaches.  Hematological: Negative for adenopathy. Does not bruise/bleed easily.  Psychiatric/Behavioral: Negative for sleep disturbance and decreased concentration.   Past Medical History  Diagnosis Date  . PUD (peptic ulcer disease)   . Dyskinesia of esophagus   . Unspecified essential hypertension   . Esophageal reflux   . Anemia   . Anxiety   . Arthritis   . Blood transfusion   . Cataract     History   Social History  . Marital Status: Single    Spouse Name: N/A    Number of Children: N/A  . Years of Education: N/A   Occupational History  . Glass blower/designer   Social History Main Topics  . Smoking status: Former Smoker    Quit date: 06/24/1990  . Smokeless tobacco: Never Used   Comment: quit 1985    . Alcohol Use: No  . Drug Use: No  . Sexually Active: Not Currently   Other Topics Concern  . Not on file   Social History Narrative  . No narrative on file    Past Surgical History  Procedure Date  . Abdominal hysterectomy   . Rotator cuff repair   . Oophorectomy   . Cataracts   . Right knee replacement     Family History  Problem Relation Age of Onset  . Diabetes Mother     Allergies  Allergen Reactions  . Nsaids Other (See Comments)    REACTION: intolerant due to reflux, history of ulcers    Current Outpatient Prescriptions on File Prior to Visit  Medication Sig Dispense Refill  . ALPRAZolam (XANAX) 0.5 MG tablet Take 1 tablet (0.5 mg total) by mouth at bedtime as needed for anxiety (for anxiety).  30 tablet  3  . amitriptyline (ELAVIL) 25 MG tablet Take 25 mg by mouth at bedtime.        Marland Kitchen APAP-MgSalicy-Phenyltolox-Caff 500-500-20-50 MG TABS Take 1 tablet by mouth 3 (three) times daily with meals as needed.  30 each  5  . B Complex-C-Folic Acid (MULTIVITAMIN, STRESS FORMULA) tablet Take 1 tablet by mouth daily.        . chlorpheniramine-HYDROcodone (TUSSIONEX) 10-8 MG/5ML LQCR Take 5 mLs by mouth every 12 (twelve) hours as needed. For cough       . desoximetasone (TOPICORT) 0.25 % cream Apply topically. Apply to rash prn        . DULoxetine (CYMBALTA)  60 MG capsule Take 60 mg by mouth daily.        Marland Kitchen etodolac (LODINE) 300 MG capsule TAKE ONE CAPSULE TWICE A DAY  60 capsule  2  . ferrous sulfate 324 (65 FE) MG TBEC Take by mouth 2 (two) times daily with a meal.       . Fesoterodine Fumarate (TOVIAZ) 8 MG TB24 Take by mouth daily.        . fexofenadine (ALLEGRA) 180 MG tablet Take 180 mg by mouth daily as needed. For allergies       . HYDROcodone-acetaminophen (VICODIN) 5-500 MG per tablet Take 1 tablet by mouth every 8 (eight) hours as needed. For pain  30 tablet  0  . HYPERCARE 20 % external solution APPLY UNDER ARMS ONCE DAILY  60 mL  3  . KLOR-CON M20 20 MEQ  tablet TAKE 1 TABLET EVERY DAY  30 tablet  5  . NEXIUM 40 MG capsule TAKE 1 CAPSULE BY MOUTH DAILY  30 capsule  4  . Olmesartan-Amlodipine-HCTZ (TRIBENZOR) 40-5-12.5 MG TABS Take 1 tablet by mouth daily.  30 tablet  11  . polyethylene glycol (MIRALAX) powder Take 17 g by mouth daily.        . prednisoLONE acetate (PRED FORTE) 1 % ophthalmic suspension Place 1 drop into both eyes 4 (four) times daily.        . sennosides-docusate sodium (SENOKOT-S) 8.6-50 MG tablet Take 1 tablet by mouth daily.        . sucralfate (CARAFATE) 1 G tablet TAKE 1 TABLET (1 GRAM) 4 TIMES A DAY (BETWEEN MEALS AND AT BEDTIME)  120 tablet  0  . sucralfate (CARAFATE) 1 GM/10ML suspension Take 10 mLs (1 g total) by mouth 4 (four) times daily.  420 mL  0    BP 164/80  Pulse 80  Temp 98.2 F (36.8 C)  Resp 16  Ht 5\' 7"  (1.702 m)  Wt 176 lb (79.833 kg)  BMI 27.57 kg/m2       Objective:   Physical Exam  Constitutional: She is oriented to person, place, and time. She appears well-developed and well-nourished. No distress.  HENT:  Head: Normocephalic and atraumatic.  Right Ear: External ear normal.  Left Ear: External ear normal.  Eyes: Conjunctivae and EOM are normal. Pupils are equal, round, and reactive to light.  Neck: Normal range of motion. Neck supple. No JVD present. No tracheal deviation present. No thyromegaly present.  Cardiovascular: Normal rate, regular rhythm, normal heart sounds and intact distal pulses.   No murmur heard. Pulmonary/Chest: Effort normal. She has wheezes. She exhibits no tenderness.  Abdominal: Soft. Bowel sounds are normal.  Musculoskeletal: She exhibits tenderness. She exhibits no edema.  Lymphadenopathy:    She has no cervical adenopathy.  Neurological: She is alert and oriented to person, place, and time. She has normal reflexes. No cranial nerve deficit.  Skin: Skin is warm and dry. She is not diaphoretic.  Psychiatric: She has a normal mood and affect. Her behavior is  normal.          Assessment & Plan:  We have refilled the Tylenol for arthritic pain as she has a significant risk to taking nonsteroidals because of her GERD and her history of peptic ulcer disease.  We have increased the Reglan to control her GERD but also recognize the risk for neurologic side effects have instructed her if she develops any tremor or other neurologic symptoms to stop the Reglan and contact the office.  Reviewed with the patient other options for treatment of her reflux.  4 chronic cough we will control her reflux give her a refill of her cough medications and to treat the asthmatic bronchitis

## 2011-08-26 ENCOUNTER — Other Ambulatory Visit: Payer: Self-pay | Admitting: *Deleted

## 2011-08-26 ENCOUNTER — Other Ambulatory Visit: Payer: Self-pay | Admitting: Internal Medicine

## 2011-08-26 MED ORDER — HYDROCODONE-HOMATROPINE 5-1.5 MG/5ML PO SYRP: 5.0000 mL | ORAL_SOLUTION | Freq: Three times a day (TID) | ORAL | Status: AC | PRN

## 2011-08-26 NOTE — Telephone Encounter (Signed)
Printed and faxed to [facility]

## 2011-08-26 NOTE — Telephone Encounter (Signed)
Pt need refill on cough med call into [facility]

## 2011-08-26 NOTE — Telephone Encounter (Signed)
Printed and faxed to pharmacy.  

## 2011-09-04 ENCOUNTER — Encounter: Payer: Self-pay | Admitting: Internal Medicine

## 2011-10-17 ENCOUNTER — Encounter (HOSPITAL_COMMUNITY): Payer: Self-pay | Admitting: Pharmacy Technician

## 2011-10-24 ENCOUNTER — Encounter (HOSPITAL_COMMUNITY)
Admission: RE | Admit: 2011-10-24 | Discharge: 2011-10-24 | Disposition: A | Discharge status: Home / Self Care | Payer: Medicare Other | Source: Ambulatory Visit | Attending: Orthopedic Surgery | Admitting: Orthopedic Surgery

## 2011-10-24 ENCOUNTER — Encounter (HOSPITAL_COMMUNITY): Payer: Self-pay

## 2011-10-24 HISTORY — DX: Stress incontinence (female) (male): N39.3

## 2011-10-24 LAB — APTT: aPTT: 33 seconds (ref 24–37)

## 2011-10-24 LAB — TYPE AND SCREEN: Antibody Screen: NEGATIVE

## 2011-10-24 LAB — CBC
HCT: 33.6 % — ABNORMAL LOW (ref 36.0–46.0)
Hemoglobin: 11.3 g/dL — ABNORMAL LOW (ref 12.0–15.0)
MCHC: 33.6 g/dL (ref 30.0–36.0)
MCV: 85.7 fL (ref 78.0–100.0)
WBC: 7.6 10*3/uL (ref 4.0–10.5)

## 2011-10-24 LAB — BASIC METABOLIC PANEL
BUN: 15 mg/dL (ref 6–23)
CO2: 29 mEq/L (ref 19–32)
Chloride: 101 mEq/L (ref 96–112)
Creatinine, Ser: 0.99 mg/dL (ref 0.50–1.10)
GFR calc Af Amer: 62 mL/min — ABNORMAL LOW (ref >90)
Potassium: 3.9 mEq/L (ref 3.5–5.1)

## 2011-10-24 LAB — SURGICAL PCR SCREEN: MRSA, PCR: NEGATIVE

## 2011-10-24 LAB — PROTIME-INR: INR: 1.04 (ref 0.00–1.49)

## 2011-10-24 NOTE — Pre-Procedure Instructions (Signed)
20 [name]  10/24/2011   Your procedure is scheduled on:  Oct 31 1098  Report to [facility] at 0900 AM.  Call this number if you have problems the morning of surgery: [phone]   Remember:   Do not eat food:After Midnight.  May have clear liquids: up to 4 Hours before arrival.0500 am  Clear liquids include soda, tea, black coffee, apple or grape juice, broth.  Take these medicines the morning of surgery with A SIP OF WATER: nexium,bp pill,pain                          pill as desired,reglan  Do not wear jewelry, make-up or nail polish.  Do not wear lotions, powders, or perfumes. You may wear deodorant.  Do not shave 48 hours prior to surgery.  Do not bring valuables to the hospital.  Contacts, dentures or bridgework may not be worn into surgery.  Leave suitcase in the car. After surgery it may be brought to your room.  For patients admitted to the hospital, checkout time is 11:00 AM the day of discharge.   Patients discharged the day of surgery will not be allowed to drive home.  Name and phone number of your driver: [name]  Special Instructions: CHG Shower Use Special Wash: 1/2 bottle night before surgery and 1/2 bottle morning of surgery.   Please read over the following fact sheets that you were given: Pain Booklet, Coughing and Deep Breathing, Blood Transfusion Information, Total Joint Packet and MRSA Information

## 2011-10-29 ENCOUNTER — Other Ambulatory Visit: Payer: Self-pay | Admitting: Orthopedic Surgery

## 2011-10-29 NOTE — Progress Notes (Signed)
Noted that orders are still not in chart.  Larkin Ina in PAT notified and stated she would contact office.//L. Bynum Mccullars,RN

## 2011-10-31 ENCOUNTER — Other Ambulatory Visit: Payer: Self-pay | Admitting: Internal Medicine

## 2011-10-31 MED ORDER — CEFAZOLIN SODIUM-DEXTROSE 2-3 GM-% IV SOLR
2.0000 g | INTRAVENOUS | Status: AC
  Administered 2011-11-01: 2 g via INTRAVENOUS
  Filled 2011-10-31: qty 50

## 2011-10-31 NOTE — Progress Notes (Signed)
Notified pt. Of time change. Instructed to be here at 0700.

## 2011-11-01 ENCOUNTER — Encounter (HOSPITAL_COMMUNITY): Payer: Self-pay | Admitting: Anesthesiology

## 2011-11-01 ENCOUNTER — Encounter (HOSPITAL_COMMUNITY): Payer: Self-pay | Admitting: *Deleted

## 2011-11-01 ENCOUNTER — Inpatient Hospital Stay (HOSPITAL_COMMUNITY)
Admission: RE | Admit: 2011-11-01 | Discharge: 2011-11-04 | DRG: 470 | Disposition: A | Discharge status: Home Health | Payer: Medicare Other | Source: Ambulatory Visit | Attending: Orthopedic Surgery | Admitting: Orthopedic Surgery

## 2011-11-01 ENCOUNTER — Encounter (HOSPITAL_COMMUNITY): Payer: Self-pay | Admitting: Surgery

## 2011-11-01 ENCOUNTER — Ambulatory Visit (HOSPITAL_COMMUNITY): Payer: Medicare Other | Admitting: Anesthesiology

## 2011-11-01 ENCOUNTER — Encounter (HOSPITAL_COMMUNITY)
Admission: RE | Disposition: A | Discharge status: Home Health | Payer: Self-pay | Source: Ambulatory Visit | Attending: Orthopedic Surgery

## 2011-11-01 DIAGNOSIS — M1712 Unilateral primary osteoarthritis, left knee: Secondary | ICD-10-CM | POA: Diagnosis present

## 2011-11-01 DIAGNOSIS — I10 Essential (primary) hypertension: Secondary | ICD-10-CM | POA: Diagnosis present

## 2011-11-01 DIAGNOSIS — D62 Acute posthemorrhagic anemia: Secondary | ICD-10-CM | POA: Diagnosis not present

## 2011-11-01 DIAGNOSIS — F411 Generalized anxiety disorder: Secondary | ICD-10-CM | POA: Diagnosis present

## 2011-11-01 DIAGNOSIS — M1710 Unilateral primary osteoarthritis, unspecified knee: Principal | ICD-10-CM | POA: Diagnosis present

## 2011-11-01 DIAGNOSIS — Z8711 Personal history of peptic ulcer disease: Secondary | ICD-10-CM

## 2011-11-01 DIAGNOSIS — K219 Gastro-esophageal reflux disease without esophagitis: Secondary | ICD-10-CM | POA: Diagnosis present

## 2011-11-01 DIAGNOSIS — Z7901 Long term (current) use of anticoagulants: Secondary | ICD-10-CM

## 2011-11-01 DIAGNOSIS — Z96659 Presence of unspecified artificial knee joint: Secondary | ICD-10-CM

## 2011-11-01 DIAGNOSIS — D500 Iron deficiency anemia secondary to blood loss (chronic): Secondary | ICD-10-CM | POA: Diagnosis present

## 2011-11-01 HISTORY — PX: KNEE ARTHROPLASTY: SHX992

## 2011-11-01 LAB — URINALYSIS, ROUTINE W REFLEX MICROSCOPIC
Ketones, ur: NEGATIVE mg/dL
Nitrite: NEGATIVE
Protein, ur: NEGATIVE mg/dL

## 2011-11-01 LAB — URINE MICROSCOPIC-ADD ON

## 2011-11-01 SURGERY — ARTHROPLASTY, KNEE, TOTAL, USING IMAGELESS COMPUTER-ASSISTED NAVIGATION
Anesthesia: General | Site: Knee | Laterality: Left

## 2011-11-01 MED ORDER — WARFARIN VIDEO: Freq: Once | Status: DC

## 2011-11-01 MED ORDER — ONDANSETRON HCL 4 MG/2ML IJ SOLN
4.0000 mg | Freq: Four times a day (QID) | INTRAMUSCULAR | Status: DC | PRN
  Administered 2011-11-01 – 2011-11-02 (×3): 4 mg via INTRAVENOUS
  Filled 2011-11-01 (×3): qty 2

## 2011-11-01 MED ORDER — PANTOPRAZOLE SODIUM 40 MG PO TBEC
80.0000 mg | DELAYED_RELEASE_TABLET | Freq: Every day | ORAL | Status: DC
  Administered 2011-11-01 – 2011-11-04 (×4): 80 mg via ORAL
  Filled 2011-11-01 (×2): qty 2
  Filled 2011-11-01 (×2): qty 1
  Filled 2011-11-01: qty 2

## 2011-11-01 MED ORDER — PROMETHAZINE HCL 25 MG/ML IJ SOLN: 6.2500 mg | INTRAMUSCULAR | Status: DC | PRN

## 2011-11-01 MED ORDER — ALPRAZOLAM 0.5 MG PO TABS: 0.5000 mg | ORAL_TABLET | Freq: Every evening | ORAL | Status: DC | PRN

## 2011-11-01 MED ORDER — POVIDONE-IODINE 7.5 % EX SOLN
Freq: Once | CUTANEOUS | Status: DC
  Filled 2011-11-01: qty 118

## 2011-11-01 MED ORDER — OLMESARTAN-AMLODIPINE-HCTZ 40-5-12.5 MG PO TABS: 1.0000 | ORAL_TABLET | Freq: Every day | ORAL | Status: DC

## 2011-11-01 MED ORDER — FERROUS SULFATE 325 (65 FE) MG PO TABS
325.0000 mg | ORAL_TABLET | Freq: Two times a day (BID) | ORAL | Status: DC
  Administered 2011-11-01 – 2011-11-04 (×6): 325 mg via ORAL
  Filled 2011-11-01 (×9): qty 1

## 2011-11-01 MED ORDER — BUPIVACAINE-EPINEPHRINE PF 0.5-1:200000 % IJ SOLN
INTRAMUSCULAR | Status: DC | PRN
  Administered 2011-11-01: 30 mL

## 2011-11-01 MED ORDER — DEXTROSE 5 % IV SOLN
INTRAVENOUS | Status: DC | PRN
  Administered 2011-11-01: 10:00:00 via INTRAVENOUS

## 2011-11-01 MED ORDER — METHOCARBAMOL 500 MG PO TABS
500.0000 mg | ORAL_TABLET | Freq: Four times a day (QID) | ORAL | Status: DC | PRN
  Administered 2011-11-01 – 2011-11-04 (×8): 500 mg via ORAL
  Filled 2011-11-01 (×8): qty 1

## 2011-11-01 MED ORDER — HYDROMORPHONE HCL PF 1 MG/ML IJ SOLN
INTRAMUSCULAR | Status: AC
  Filled 2011-11-01: qty 1

## 2011-11-01 MED ORDER — ACETAMINOPHEN 10 MG/ML IV SOLN
INTRAVENOUS | Status: AC
  Filled 2011-11-01: qty 100

## 2011-11-01 MED ORDER — MIDAZOLAM HCL 2 MG/2ML IJ SOLN: 0.5000 mg | Freq: Once | INTRAMUSCULAR | Status: DC | PRN

## 2011-11-01 MED ORDER — DIPHENHYDRAMINE HCL 12.5 MG/5ML PO ELIX: 12.5000 mg | ORAL_SOLUTION | Freq: Four times a day (QID) | ORAL | Status: DC | PRN

## 2011-11-01 MED ORDER — POLYETHYLENE GLYCOL 3350 17 G PO PACK
17.0000 g | PACK | Freq: Every day | ORAL | Status: DC
  Administered 2011-11-01 – 2011-11-04 (×3): 17 g via ORAL
  Filled 2011-11-01 (×4): qty 1

## 2011-11-01 MED ORDER — POTASSIUM CHLORIDE CRYS ER 20 MEQ PO TBCR
20.0000 meq | EXTENDED_RELEASE_TABLET | Freq: Every day | ORAL | Status: DC
  Administered 2011-11-01 – 2011-11-04 (×4): 20 meq via ORAL
  Filled 2011-11-01 (×4): qty 1

## 2011-11-01 MED ORDER — AMLODIPINE BESYLATE 5 MG PO TABS
5.0000 mg | ORAL_TABLET | Freq: Every day | ORAL | Status: DC
  Administered 2011-11-03: 5 mg via ORAL
  Filled 2011-11-01 (×4): qty 1

## 2011-11-01 MED ORDER — HYDROCHLOROTHIAZIDE 12.5 MG PO CAPS
12.5000 mg | ORAL_CAPSULE | Freq: Every day | ORAL | Status: DC
  Administered 2011-11-02 – 2011-11-03 (×2): 12.5 mg via ORAL
  Filled 2011-11-01 (×4): qty 1

## 2011-11-01 MED ORDER — SODIUM CHLORIDE 0.9 % IR SOLN
Status: DC | PRN
  Administered 2011-11-01: 3000 mL
  Administered 2011-11-01: 1000 mL

## 2011-11-01 MED ORDER — HYDROMORPHONE HCL PF 1 MG/ML IJ SOLN
0.2500 mg | INTRAMUSCULAR | Status: DC | PRN
  Administered 2011-11-01 (×4): 0.5 mg via INTRAVENOUS

## 2011-11-01 MED ORDER — CEFAZOLIN SODIUM-DEXTROSE 2-3 GM-% IV SOLR
2.0000 g | Freq: Four times a day (QID) | INTRAVENOUS | Status: AC
  Administered 2011-11-01 – 2011-11-02 (×3): 2 g via INTRAVENOUS
  Filled 2011-11-01 (×4): qty 50

## 2011-11-01 MED ORDER — IRBESARTAN 300 MG PO TABS
300.0000 mg | ORAL_TABLET | Freq: Every day | ORAL | Status: DC
  Administered 2011-11-01 – 2011-11-03 (×3): 300 mg via ORAL
  Filled 2011-11-01 (×4): qty 1

## 2011-11-01 MED ORDER — FERROUS SULFATE 325 (65 FE) MG PO TABS
ORAL_TABLET | Freq: Two times a day (BID) | ORAL | Status: DC
  Filled 2011-11-01 (×2): qty 1

## 2011-11-01 MED ORDER — ONDANSETRON HCL 4 MG/2ML IJ SOLN
INTRAMUSCULAR | Status: DC | PRN
  Administered 2011-11-01: 4 mg via INTRAVENOUS

## 2011-11-01 MED ORDER — COUMADIN BOOK
Freq: Once | Status: AC
  Administered 2011-11-01: 17:00:00
  Filled 2011-11-01: qty 1

## 2011-11-01 MED ORDER — WARFARIN - PHARMACIST DOSING INPATIENT
Freq: Every day | Status: DC
  Administered 2011-11-02: 18:00:00

## 2011-11-01 MED ORDER — DIPHENHYDRAMINE HCL 50 MG/ML IJ SOLN: 12.5000 mg | Freq: Four times a day (QID) | INTRAMUSCULAR | Status: DC | PRN

## 2011-11-01 MED ORDER — ALUM & MAG HYDROXIDE-SIMETH 200-200-20 MG/5ML PO SUSP: 30.0000 mL | ORAL | Status: DC | PRN

## 2011-11-01 MED ORDER — FENTANYL CITRATE 0.05 MG/ML IJ SOLN
INTRAMUSCULAR | Status: DC | PRN
Start: 2011-11-01 — End: 2011-11-01
  Administered 2011-11-01: 50 ug via INTRAVENOUS
  Administered 2011-11-01: 100 ug via INTRAVENOUS
  Administered 2011-11-01 (×5): 50 ug via INTRAVENOUS
  Administered 2011-11-01: 100 ug via INTRAVENOUS

## 2011-11-01 MED ORDER — CEFUROXIME SODIUM 1.5 G IJ SOLR
INTRAMUSCULAR | Status: DC | PRN
  Administered 2011-11-01: 1.5 g

## 2011-11-01 MED ORDER — METOCLOPRAMIDE HCL 10 MG PO TABS
5.0000 mg | ORAL_TABLET | Freq: Three times a day (TID) | ORAL | Status: DC | PRN
  Administered 2011-11-02 – 2011-11-04 (×3): 5 mg via ORAL
  Filled 2011-11-01: qty 1
  Filled 2011-11-01: qty 2
  Filled 2011-11-01 (×2): qty 1

## 2011-11-01 MED ORDER — SENNOSIDES-DOCUSATE SODIUM 8.6-50 MG PO TABS
1.0000 | ORAL_TABLET | Freq: Every day | ORAL | Status: DC
  Administered 2011-11-01 – 2011-11-04 (×4): 1 via ORAL
  Filled 2011-11-01 (×4): qty 1

## 2011-11-01 MED ORDER — DULOXETINE HCL 60 MG PO CPEP
60.0000 mg | ORAL_CAPSULE | Freq: Every day | ORAL | Status: DC
  Administered 2011-11-01 – 2011-11-04 (×4): 60 mg via ORAL
  Filled 2011-11-01 (×4): qty 1

## 2011-11-01 MED ORDER — OXYCODONE-ACETAMINOPHEN 5-325 MG PO TABS
1.0000 | ORAL_TABLET | ORAL | Status: DC | PRN
  Administered 2011-11-02: 1 via ORAL
  Administered 2011-11-02 – 2011-11-03 (×6): 2 via ORAL
  Administered 2011-11-03: 1 via ORAL
  Administered 2011-11-04 (×3): 2 via ORAL
  Filled 2011-11-01: qty 2
  Filled 2011-11-01 (×2): qty 1
  Filled 2011-11-01 (×8): qty 2

## 2011-11-01 MED ORDER — DEXTROSE-NACL 5-0.45 % IV SOLN
INTRAVENOUS | Status: DC
  Administered 2011-11-01 – 2011-11-02 (×4): via INTRAVENOUS

## 2011-11-01 MED ORDER — NALOXONE HCL 0.4 MG/ML IJ SOLN: 0.4000 mg | INTRAMUSCULAR | Status: DC | PRN

## 2011-11-01 MED ORDER — WARFARIN SODIUM 5 MG PO TABS
5.0000 mg | ORAL_TABLET | Freq: Once | ORAL | Status: AC
  Administered 2011-11-01: 5 mg via ORAL
  Filled 2011-11-01: qty 1

## 2011-11-01 MED ORDER — ACETAMINOPHEN 10 MG/ML IV SOLN
1000.0000 mg | Freq: Four times a day (QID) | INTRAVENOUS | Status: AC
  Administered 2011-11-01 – 2011-11-02 (×3): 1000 mg via INTRAVENOUS
  Filled 2011-11-01 (×3): qty 100

## 2011-11-01 MED ORDER — ONDANSETRON HCL 4 MG PO TABS
4.0000 mg | ORAL_TABLET | Freq: Four times a day (QID) | ORAL | Status: DC | PRN
  Administered 2011-11-03: 4 mg via ORAL
  Filled 2011-11-01: qty 1

## 2011-11-01 MED ORDER — ONDANSETRON HCL 4 MG/2ML IJ SOLN: 4.0000 mg | Freq: Four times a day (QID) | INTRAMUSCULAR | Status: DC | PRN

## 2011-11-01 MED ORDER — MORPHINE SULFATE (PF) 1 MG/ML IV SOLN
INTRAVENOUS | Status: DC
  Administered 2011-11-01: 1 mL via INTRAVENOUS
  Administered 2011-11-01: 20.63 mg via INTRAVENOUS
  Administered 2011-11-01: 13:00:00 via INTRAVENOUS
  Administered 2011-11-01: 5 mg via INTRAVENOUS
  Administered 2011-11-02: 13:00:00 via INTRAVENOUS
  Administered 2011-11-02: 4 mg via INTRAVENOUS
  Administered 2011-11-02: 3 mg via INTRAVENOUS
  Administered 2011-11-02: 07:00:00 via INTRAVENOUS
  Filled 2011-11-01 (×3): qty 25

## 2011-11-01 MED ORDER — PHENYLEPHRINE HCL 10 MG/ML IJ SOLN
INTRAMUSCULAR | Status: DC | PRN
  Administered 2011-11-01: 40 ug via INTRAVENOUS

## 2011-11-01 MED ORDER — SODIUM CHLORIDE 0.9 % IJ SOLN: 9.0000 mL | INTRAMUSCULAR | Status: DC | PRN

## 2011-11-01 MED ORDER — MEPERIDINE HCL 25 MG/ML IJ SOLN: 6.2500 mg | INTRAMUSCULAR | Status: DC | PRN

## 2011-11-01 MED ORDER — ACETAMINOPHEN 10 MG/ML IV SOLN
INTRAVENOUS | Status: DC | PRN
  Administered 2011-11-01: 1000 mg via INTRAVENOUS

## 2011-11-01 MED ORDER — MIDAZOLAM HCL 5 MG/5ML IJ SOLN
INTRAMUSCULAR | Status: DC | PRN
  Administered 2011-11-01 (×3): 1 mg via INTRAVENOUS

## 2011-11-01 MED ORDER — DEXTROSE 5 % IV SOLN
500.0000 mg | Freq: Four times a day (QID) | INTRAVENOUS | Status: DC | PRN
  Filled 2011-11-01: qty 5

## 2011-11-01 MED ORDER — LACTATED RINGERS IV SOLN
INTRAVENOUS | Status: DC | PRN
  Administered 2011-11-01 (×2): via INTRAVENOUS

## 2011-11-01 MED ORDER — FESOTERODINE FUMARATE ER 8 MG PO TB24
8.0000 mg | ORAL_TABLET | Freq: Every day | ORAL | Status: DC
  Administered 2011-11-01 – 2011-11-04 (×4): 8 mg via ORAL
  Filled 2011-11-01 (×6): qty 1

## 2011-11-01 MED ORDER — PREDNISOLONE ACETATE 1 % OP SUSP
1.0000 [drp] | Freq: Four times a day (QID) | OPHTHALMIC | Status: DC | PRN
  Filled 2011-11-01: qty 1

## 2011-11-01 MED ORDER — METHOCARBAMOL 100 MG/ML IJ SOLN
500.0000 mg | INTRAVENOUS | Status: AC
  Administered 2011-11-01: 500 mg via INTRAVENOUS
  Filled 2011-11-01: qty 5

## 2011-11-01 MED ORDER — MORPHINE SULFATE (PF) 1 MG/ML IV SOLN
INTRAVENOUS | Status: AC
  Administered 2011-11-01: 1 mL via INTRAVENOUS
  Filled 2011-11-01: qty 25

## 2011-11-01 MED ORDER — LIDOCAINE HCL (CARDIAC) 20 MG/ML IV SOLN
INTRAVENOUS | Status: DC | PRN
  Administered 2011-11-01: 40 mg via INTRAVENOUS

## 2011-11-01 MED ORDER — ZOLPIDEM TARTRATE 5 MG PO TABS: 5.0000 mg | ORAL_TABLET | Freq: Every evening | ORAL | Status: DC | PRN

## 2011-11-01 MED ORDER — POLYETHYLENE GLYCOL 3350 17 GM/SCOOP PO POWD
17.0000 g | Freq: Every day | ORAL | Status: DC
  Filled 2011-11-01: qty 255

## 2011-11-01 MED ORDER — LACTATED RINGERS IV SOLN: INTRAVENOUS | Status: DC

## 2011-11-01 MED ORDER — PROPOFOL 10 MG/ML IV EMUL
INTRAVENOUS | Status: DC | PRN
  Administered 2011-11-01: 170 mg via INTRAVENOUS
  Administered 2011-11-01: 20 mg via INTRAVENOUS
  Administered 2011-11-01 (×2): 30 mg via INTRAVENOUS

## 2011-11-01 SURGICAL SUPPLY — 68 items
APL SKNCLS STERI-STRIP NONHPOA (GAUZE/BANDAGES/DRESSINGS) ×1
BANDAGE ELASTIC 6 VELCRO ST LF (GAUZE/BANDAGES/DRESSINGS) ×1 IMPLANT
BANDAGE ESMARK 6X9 LF (GAUZE/BANDAGES/DRESSINGS) ×1 IMPLANT
BENZOIN TINCTURE PRP APPL 2/3 (GAUZE/BANDAGES/DRESSINGS) ×2 IMPLANT
BLADE SAGITTAL 25.0X1.19X90 (BLADE) ×2 IMPLANT
BLADE SAW SAG 90X13X1.27 (BLADE) ×2 IMPLANT
BNDG CMPR 9X6 STRL LF SNTH (GAUZE/BANDAGES/DRESSINGS) ×1
BNDG ESMARK 6X9 LF (GAUZE/BANDAGES/DRESSINGS) ×2
BOWL SMART MIX CTS (DISPOSABLE) ×2 IMPLANT
CEMENT HV SMART SET (Cement) ×4 IMPLANT
CLOTH BEACON ORANGE TIMEOUT ST (SAFETY) ×2 IMPLANT
COVER BACK TABLE 24X17X13 BIG (DRAPES) IMPLANT
COVER SURGICAL LIGHT HANDLE (MISCELLANEOUS) ×2 IMPLANT
CUFF TOURNIQUET SINGLE 34IN LL (TOURNIQUET CUFF) ×2 IMPLANT
CUFF TOURNIQUET SINGLE 44IN (TOURNIQUET CUFF) IMPLANT
DRAPE EXTREMITY T 121X128X90 (DRAPE) ×2 IMPLANT
DRAPE INCISE IOBAN 66X45 STRL (DRAPES) ×1 IMPLANT
DRAPE U-SHAPE 47X51 STRL (DRAPES) ×2 IMPLANT
DRSG PAD ABDOMINAL 8X10 ST (GAUZE/BANDAGES/DRESSINGS) ×2 IMPLANT
DURAPREP 26ML APPLICATOR (WOUND CARE) ×2 IMPLANT
ELECT REM PT RETURN 9FT ADLT (ELECTROSURGICAL) ×2
ELECTRODE REM PT RTRN 9FT ADLT (ELECTROSURGICAL) ×1 IMPLANT
EVACUATOR 1/8 PVC DRAIN (DRAIN) ×1 IMPLANT
FACESHIELD LNG OPTICON STERILE (SAFETY) ×2 IMPLANT
GAUZE XEROFORM 1X8 LF (GAUZE/BANDAGES/DRESSINGS) ×1 IMPLANT
GAUZE XEROFORM 5X9 LF (GAUZE/BANDAGES/DRESSINGS) ×2 IMPLANT
GLOVE BIOGEL PI IND STRL 8 (GLOVE) ×2 IMPLANT
GLOVE BIOGEL PI INDICATOR 8 (GLOVE) ×2
GLOVE ECLIPSE 7.5 STRL STRAW (GLOVE) ×4 IMPLANT
GOWN PREVENTION PLUS XLARGE (GOWN DISPOSABLE) ×2 IMPLANT
GOWN SRG XL XLNG 56XLVL 4 (GOWN DISPOSABLE) ×1 IMPLANT
GOWN STRL NON-REIN LRG LVL3 (GOWN DISPOSABLE) ×2 IMPLANT
GOWN STRL NON-REIN XL XLG LVL4 (GOWN DISPOSABLE) ×2
HANDPIECE INTERPULSE COAX TIP (DISPOSABLE) ×2
HOOD PEEL AWAY FACE SHEILD DIS (HOOD) ×6 IMPLANT
IMMOBILIZER KNEE 20 (SOFTGOODS)
IMMOBILIZER KNEE 20 THIGH 36 (SOFTGOODS) IMPLANT
IMMOBILIZER KNEE 22 UNIV (SOFTGOODS) ×2 IMPLANT
IMMOBILIZER KNEE 24 THIGH 36 (MISCELLANEOUS) IMPLANT
IMMOBILIZER KNEE 24 UNIV (MISCELLANEOUS)
KIT BASIN OR (CUSTOM PROCEDURE TRAY) ×2 IMPLANT
KIT ROOM TURNOVER OR (KITS) ×2 IMPLANT
MANIFOLD NEPTUNE II (INSTRUMENTS) ×2 IMPLANT
MARKER SPHERE PSV REFLC THRD 5 (MARKER) ×6 IMPLANT
NDL HYPO 25GX1X1/2 BEV (NEEDLE) IMPLANT
NEEDLE HYPO 25GX1X1/2 BEV (NEEDLE) IMPLANT
NS IRRIG 1000ML POUR BTL (IV SOLUTION) ×2 IMPLANT
PACK TOTAL JOINT (CUSTOM PROCEDURE TRAY) ×2 IMPLANT
PAD ARMBOARD 7.5X6 YLW CONV (MISCELLANEOUS) ×4 IMPLANT
PAD CAST 4YDX4 CTTN HI CHSV (CAST SUPPLIES) ×1 IMPLANT
PADDING CAST COTTON 4X4 STRL (CAST SUPPLIES) ×2
PIN SCHANZ 4MM 130MM (PIN) ×8 IMPLANT
SET HNDPC FAN SPRY TIP SCT (DISPOSABLE) ×1 IMPLANT
SPONGE GAUZE 4X4 12PLY (GAUZE/BANDAGES/DRESSINGS) ×2 IMPLANT
STAPLER VISISTAT 35W (STAPLE) ×1 IMPLANT
STRIP CLOSURE SKIN 1/2X4 (GAUZE/BANDAGES/DRESSINGS) ×3 IMPLANT
SUCTION FRAZIER TIP 10 FR DISP (SUCTIONS) ×2 IMPLANT
SUT MON AB 3-0 SH 27 (SUTURE) ×2
SUT MON AB 3-0 SH27 (SUTURE) IMPLANT
SUT VIC AB 0 CTB1 27 (SUTURE) ×4 IMPLANT
SUT VIC AB 1 CT1 27 (SUTURE) ×4
SUT VIC AB 1 CT1 27XBRD ANBCTR (SUTURE) ×2 IMPLANT
SUT VIC AB 2-0 CTB1 (SUTURE) ×4 IMPLANT
SYR CONTROL 10ML LL (SYRINGE) IMPLANT
TOWEL OR 17X24 6PK STRL BLUE (TOWEL DISPOSABLE) ×2 IMPLANT
TOWEL OR 17X26 10 PK STRL BLUE (TOWEL DISPOSABLE) ×2 IMPLANT
TRAY FOLEY CATH 14FR (SET/KITS/TRAYS/PACK) ×2 IMPLANT
WATER STERILE IRR 1000ML POUR (IV SOLUTION) ×4 IMPLANT

## 2011-11-01 NOTE — Anesthesia Postprocedure Evaluation (Signed)
  Anesthesia Post-op Note  Patient: [name]  Procedure(s) Performed: Procedure(s) (LRB): COMPUTER ASSISTED TOTAL KNEE ARTHROPLASTY (Left)  Patient Location: PACU  Anesthesia Type: GA combined with regional for post-op pain  Level of Consciousness: awake, alert  and oriented  Airway and Oxygen Therapy: Patient Spontanous Breathing and Patient connected to nasal cannula oxygen  Post-op Pain: none  Post-op Assessment: Post-op Vital signs reviewed, Patient's Cardiovascular Status Stable, Respiratory Function Stable, Patent Airway, No signs of Nausea or vomiting and Pain level controlled  Post-op Vital Signs: Reviewed and stable  Complications: No apparent anesthesia complications

## 2011-11-01 NOTE — Progress Notes (Signed)
UR COMPLETED  

## 2011-11-01 NOTE — H&P (Signed)
PREOPERATIVE H&P  Chief Complaint: l knee pain  HPI: [name] is a 78 y.o. female who presents for evaluation of l. Knee apain. It has been present for greater than 1 year and has been worsening. She has failed conservative measures.  She has bone on bone arthritis Pain is rated as moderate.  Past Medical History  Diagnosis Date  . PUD (peptic ulcer disease)   . Dyskinesia of esophagus   . Unspecified essential hypertension   . Esophageal reflux   . Anemia   . Anxiety   . Arthritis   . Blood transfusion   . Cataract     surgery 2008  . Stress incontinence    Past Surgical History  Procedure Date  . Abdominal hysterectomy   . Rotator cuff repair   . Oophorectomy   . Cataracts   . Right knee replacement    History   Social History  . Marital Status: Single    Spouse Name: N/A    Number of Children: N/A  . Years of Education: N/A   Occupational History  . Glass blower/designer   Social History Main Topics  . Smoking status: Former Smoker    Quit date: 06/24/1990  . Smokeless tobacco: Never Used   Comment: quit 1985  . Alcohol Use: No  . Drug Use: No  . Sexually Active: Not Currently   Other Topics Concern  . None   Social History Narrative  . None   Family History  Problem Relation Age of Onset  . Diabetes Mother    Allergies  Allergen Reactions  . Nsaids Other (See Comments)    REACTION: intolerant due to reflux, history of ulcers   Prior to Admission medications   Medication Sig Start Date End Date Taking? Authorizing Provider  ALPRAZolam Prudy Feeler) 0.5 MG tablet Take 0.5 mg by mouth at bedtime as needed. For anxiety and sleep 07/02/11 07/01/12 Yes Stacie Glaze, MD  B Complex-C-Folic Acid (MULTIVITAMIN, STRESS FORMULA) tablet Take 1 tablet by mouth daily.     Yes Historical Provider, MD  chlorpheniramine-HYDROcodone (TUSSIONEX) 10-8 MG/5ML LQCR Take 5 mLs by mouth every 12 (twelve) hours as needed. For cough    Yes Historical Provider, MD  desoximetasone  (TOPICORT) 0.25 % cream Apply 1 application topically 2 (two) times daily as needed. Apply to rash prn    Yes Historical Provider, MD  DULoxetine (CYMBALTA) 60 MG capsule Take 60 mg by mouth daily.     Yes Historical Provider, MD  esomeprazole (NEXIUM) 40 MG capsule Take 40 mg by mouth daily before breakfast.   Yes Historical Provider, MD  etodolac (LODINE) 300 MG capsule Take 300 mg by mouth 2 (two) times daily.   Yes Historical Provider, MD  ferrous sulfate 324 (65 FE) MG TBEC Take by mouth 2 (two) times daily with a meal.    Yes Historical Provider, MD  Fesoterodine Fumarate (TOVIAZ) 8 MG TB24 Take by mouth daily.     Yes Historical Provider, MD  fexofenadine (ALLEGRA) 180 MG tablet Take 180 mg by mouth daily as needed. For allergies  02/22/11  Yes Stacie Glaze, MD  HYDROcodone-acetaminophen (VICODIN) 5-500 MG per tablet Take 1 tablet by mouth every 8 (eight) hours as needed. For pain 06/10/11  Yes Stacie Glaze, MD  metoCLOPramide (REGLAN) 5 MG tablet Take 5 mg by mouth 3 (three) times daily with meals as needed. 08/23/11 08/22/12 Yes Stacie Glaze, MD  Olmesartan-Amlodipine-HCTZ 40-5-12.5 MG TABS Take 1 tablet by mouth  daily. 06/21/11  Yes Stacie Glaze, MD  potassium chloride SA (K-DUR,KLOR-CON) 20 MEQ tablet Take 20 mEq by mouth daily.   Yes Historical Provider, MD  prednisoLONE acetate (PRED FORTE) 1 % ophthalmic suspension Place 1 drop into both eyes 4 (four) times daily as needed. For eye irritation 02/22/11  Yes Stacie Glaze, MD  PRESCRIPTION MEDICATION Apply 1 application topically daily. Hypercare 20% external solution under arms daily   Yes Historical Provider, MD  sennosides-docusate sodium (SENOKOT-S) 8.6-50 MG tablet Take 1 tablet by mouth daily.     Yes Historical Provider, MD  sucralfate (CARAFATE) 1 G tablet Take 1 g by mouth 4 (four) times daily as needed. For stomach acid   Yes Historical Provider, MD  traMADol (ULTRAM) 50 MG tablet Take 50 mg by mouth every 6 (six) hours as  needed. For pain  Maximum dose= 8 tablets per day 08/23/11  Yes Stacie Glaze, MD  polyethylene glycol [facility]) powder Take 17 g by mouth daily.      Historical Provider, MD     Positive ROS: none  All other systems have been reviewed and were otherwise negative with the exception of those mentioned in the HPI and as above.  Physical Exam: Filed Vitals:   11/01/11 0715  BP: 125/72  Pulse: 82  Temp: 98.3 F (36.8 C)  Resp: 18    General: Alert, no acute distress Cardiovascular: No pedal edema Respiratory: No cyanosis, no use of accessory musculature GI: No organomegaly, abdomen is soft and non-tender Skin: No lesions in the area of chief complaint Neurologic: Sensation intact distally Psychiatric: Patient is competent for consent with normal mood and affect Lymphatic: No axillary or cervical lymphadenopathy  MUSCULOSKELETAL: l. Knee painful rom. No instability + jt line tenderness  Assessment/Plan: DEGENERATIVE JOINT DISEASE left knee Plan for Procedure(s): COMPUTER ASSISTED TOTAL KNEE ARTHROPLASTY  The risks benefits and alternatives were discussed with the patient including but not limited to the risks of nonoperative treatment, versus surgical intervention including infection, bleeding, nerve injury, malunion, nonunion, hardware prominence, hardware failure, need for hardware removal, blood clots, cardiopulmonary complications, morbidity, mortality, among others, and they were willing to proceed.  Predicted outcome is good, although there will be at least a six to nine month expected recovery.  Marsi Turvey L, MD 11/01/2011 9:13 AM

## 2011-11-01 NOTE — Progress Notes (Signed)
Transport has arrived and pt is still unable to urinate for urine sample. Will place note on chart.

## 2011-11-01 NOTE — Preoperative (Signed)
Beta Blockers   Reason not to administer Beta Blockers:Not Applicable 

## 2011-11-01 NOTE — Progress Notes (Signed)
Dr. Luiz Blare notified of pt having CBC and BMP on 10/24/11 and having current CBC with Diff and CMP ordered DOS. Dr. Luiz Blare informed nurse that a CBC with Diff and CMP would not be needed since pt had labs on 10/24/11.

## 2011-11-01 NOTE — Anesthesia Procedure Notes (Addendum)
Anesthesia Regional Block:  Femoral nerve block  Pre-Anesthetic Checklist: ,, timeout performed, Correct Patient, Correct Site, Correct Laterality, Correct Procedure, Correct Position, site marked, Risks and benefits discussed,  Surgical consent,  Pre-op evaluation,  At surgeon's request and post-op pain management  Laterality: Left  Prep: chloraprep       Needles:  Injection technique: Single-shot  Needle Type: Stimulator Needle - 40      Needle Gauge: 22 and 22 G    Additional Needles:  Procedures: nerve stimulator Femoral nerve block  Nerve Stimulator or Paresthesia:  Response: patella twitch, 0.4 mA, 0.1 ms,   Additional Responses:   Narrative:  Start time: 11/01/2011 9:07 AM End time: 11/01/2011 9:13 AM Injection made incrementally with aspirations every 5 mL.  Performed by: Personally  Anesthesiologist: Sandford Craze, MD  Additional Notes: Pt identified in Holding room.  Monitors applied. Working IV access confirmed. Sterile prep L groin.  #22ga PNS to patella twitch at 0.55mA threshold.  30cc 0.5% Bupivacaine with 1:200k epi injected incrementally after negative test dose.  Patient asymptomatic, VSS, no heme aspirated, tolerated well.   Sandford Craze, MD  Femoral nerve block Procedure Name: LMA Insertion Date/Time: 11/01/2011 9:41 AM Performed by: Marni Griffon Pre-anesthesia Checklist: Patient identified, Emergency Drugs available, Suction available and Patient being monitored Patient Re-evaluated:Patient Re-evaluated prior to inductionOxygen Delivery Method: Circle system utilized Preoxygenation: Pre-oxygenation with 100% oxygen Intubation Type: IV induction Ventilation: Mask ventilation without difficulty LMA: LMA with gastric port inserted LMA Size: 4.0 Number of attempts: 1 Placement Confirmation: positive ETCO2 and breath sounds checked- equal and bilateral (Dr. Jean Rosenthal) ETT to lip (cm): Taped across flange to cheeks. Tube secured with: Taped across flange to  cheeks. Dental Injury: Teeth and Oropharynx as per pre-operative assessment

## 2011-11-01 NOTE — Progress Notes (Signed)
ANTICOAGULATION CONSULT NOTE - Initial Consult  Pharmacy Consult for Coumadin Indication: VTE prophylaxis  Allergies  Allergen Reactions  . Nsaids Other (See Comments)    REACTION: intolerant due to reflux, history of ulcers    Patient Measurements: Height: 5\' 7"  (170.2 cm) Weight: 182 lb (82.555 kg) IBW/kg (Calculated) : 61.6   Vital Signs: Temp: 97.3 F (36.3 C) (05/10 1350) Temp src: Oral (05/10 1350) BP: 112/35 mmHg (05/10 1350) Pulse Rate: 86  (05/10 1350)  Labs: No results found for this basename: HGB:2,HCT:3,PLT:3,APTT:3,LABPROT:3,INR:3,HEPARINUNFRC:3,CREATININE:3,CKTOTAL:3,CKMB:3,TROPONINI:3 in the last 72 hours  Estimated Creatinine Clearance: 51.8 ml/min (by C-G formula based on Cr of 0.99).   Medical History: Past Medical History  Diagnosis Date  . PUD (peptic ulcer disease)   . Dyskinesia of esophagus   . Unspecified essential hypertension   . Esophageal reflux   . Anemia   . Anxiety   . Arthritis   . Blood transfusion   . Cataract     surgery 2008  . Stress incontinence     Medications:  Scheduled:    . acetaminophen  1,000 mg Intravenous Q6H  . amLODipine  5 mg Oral Daily  .  ceFAZolin (ANCEF) IV  2 g Intravenous 60 min Pre-Op  .  ceFAZolin (ANCEF) IV  2 g Intravenous Q6H  . DULoxetine  60 mg Oral Daily  . ferrous sulfate  325 mg Oral BID WC  . fesoterodine  8 mg Oral Daily  . hydrochlorothiazide  12.5 mg Oral Daily  . HYDROmorphone      . irbesartan  300 mg Oral Daily  . methocarbamol (ROBAXIN) IV  500 mg Intravenous To PACU  . morphine   Intravenous Q4H  . morphine      . pantoprazole  80 mg Oral Q1200  . polyethylene glycol  17 g Oral Daily  . potassium chloride SA  20 mEq Oral Daily  . senna-docusate  1 tablet Oral Daily  . DISCONTD: ferrous sulfate   Oral BID WC  . DISCONTD: Olmesartan-Amlodipine-HCTZ  1 tablet Oral Daily  . DISCONTD: polyethylene glycol powder  17 g Oral Daily  . DISCONTD: povidone-iodine   Topical Once     Assessment: 78yo female to start Coumadin for VTE px s/p L-TKA.  Baseline INR = 1.04 & Hg 11.3.  She has a history of anemia & blood transfusion & has been on Fe bid pta which has been ordered.  Goal of Therapy:  INR 2-3 Monitor platelets by anticoagulation protocol: Yes   Plan:  1.  Coumadin 5mg  tonight 2.  Daily INR 3.  Coumadin book & video  Marisue Humble, PharmD Clinical Pharmacist [facility]

## 2011-11-01 NOTE — Transfer of Care (Signed)
Immediate Anesthesia Transfer of Care Note  Patient: [name]  Procedure(s) Performed: Procedure(s) (LRB): COMPUTER ASSISTED TOTAL KNEE ARTHROPLASTY (Left)  Patient Location: PACU  Anesthesia Type: General and Regional  Level of Consciousness: awake, alert , oriented and sedated  Airway & Oxygen Therapy: Patient Spontanous Breathing and Patient connected to nasal cannula oxygen  Post-op Assessment: Report given to PACU RN, Post -op Vital signs reviewed and stable and Patient moving all extremities  Post vital signs: Reviewed and stable  Complications: No apparent anesthesia complications

## 2011-11-01 NOTE — Brief Op Note (Signed)
11/01/2011  11:41 AM  PATIENT:  Anastasia Pall  78 y.o. female  PRE-OPERATIVE DIAGNOSIS:  DEGENERATIVE JOINT DISEASE  POST-OPERATIVE DIAGNOSIS:  DEGENERATIVE JOINT DISEASE  PROCEDURE:  Procedure(s) (LRB): COMPUTER ASSISTED TOTAL KNEE ARTHROPLASTY (Left)  SURGEON:  Surgeon(s) and Role:    * Harvie Junior, MD - Primary  PHYSICIAN ASSISTANT:   ASSISTANTS: bethune   ANESTHESIA:   general  EBL:  Total I/O In: 1050 [I.V.:1050] Out: 400 [Urine:400]  BLOOD ADMINISTERED:none  DRAINS: (1) Hemovact drain(s) in the l. knee with  Suction Open   LOCAL MEDICATIONS USED:  NONE  SPECIMEN:  No Specimen  DISPOSITION OF SPECIMEN:  N/A  COUNTS:  YES  TOURNIQUET:  * Missing tourniquet times found for documented tourniquets in log:  [id number] *  DICTATION: .Other Dictation: Dictation Number [id number]  PLAN OF CARE: Admit to inpatient   PATIENT DISPOSITION:  PACU - hemodynamically stable.   Delay start of Pharmacological VTE agent (>24hrs) due to surgical blood loss or risk of bleeding: no

## 2011-11-01 NOTE — Op Note (Signed)
[name], [name]                 ACCOUNT NO.:  [id number]  MEDICAL RECORD NO.:  [id number]  LOCATION:  5012                         FACILITY:  [facility]  PHYSICIAN:  Harvie Junior, M.D.   DATE OF BIRTH:  03/14/1931  DATE OF PROCEDURE:  11/01/2011 DATE OF DISCHARGE:                              OPERATIVE REPORT   PREOPERATIVE DIAGNOSIS:  Degenerative joint disease, left knee, end stage.  POSTOPERATIVE DIAGNOSIS:  Degenerative joint disease, left knee, end stage.  PROCEDURE: 1. Left total knee replacement with Sigma system, size 2.5 femur, size     2.5 tibia, 10 mm bridging bearing, and a 35-mm all polyethylene     patella. 2. Computer-assisted left total knee replacement.  SURGEON:  Harvie Junior, M.D.  ASSISTANT:  Marshia Ly, PA.  ANESTHESIA:  General.  BRIEF HISTORY:  Mrs. [name] is a 78 year old female with long history of severe bilateral degenerative joint disease of knees.  She was treated conservatively for prolonged period of time.  She ultimately underwent right total knee replacement and did great with that.  She tried to hold off left total knee as long as she could with injections, activity modification, use of a cane.  Because of failure of all conservative care, she was taken to the operating room for left total knee replacement.  Preoperative x-ray showed severe bone-on-bone changes in the left knee.  Because of her extreme valgus malalignment, I felt the computer assistance was appropriate and this was chosen to be used preoperatively.  PROCEDURE:  The patient was taken to the operating room.  After adequate anesthesia was induced with general anesthetic, the patient was placed supine on the operating table.  The left leg was prepped and draped in the usual sterile fashion.  Following this, the leg was exsanguinated. Blood pressure tourniquet was inflated to 350 mmHg.  Following this, a midline incision was made in subcutaneous tissue down the level  of extensor mechanism and a medial parapatellar arthrotomy was undertaken. At this point, medial and lateral meniscus removed, retropatellar fat pad, and synovium in the anterior aspect of the femur.  Once this was completed, attention turned towards adding the computer modules, 2 pins in the tibia, 2 pins in the femur, and the alignment was checked with the rotational check.  Once this was done, attention was turned to the tibia, which was cut perpendicular to its long axis.  Attention was turned to the femur, which was cut perpendicular to the anatomic axis. Once this was done, the spacer blocks were put in place and perfect neutral long alignment was achieved with computer assistance.  Once this was done, the alignment guide was used to give rotational alignment and this was checked with against the epicondylar axis.  Once this was completed, attention turned towards cutting the femur to a size 2.5 anterior and posterior cuts, chamfers, and box.  Attention was turned to the tibia, which was drilled and keeled, and the trial components were put in place.  Attention was turned to the patella, which was cut down to a level of 13 mm and 35 paddle was chosen and lugs were drilled.  The lugs were drilled in the  femur.  Excellent range of motion was achieved, no need for lateral release, perfect neutral long alignment gap balance. Attention turned towards removal of the trials.  Final components were then cemented into place after copiously irrigating the knee and suctioning it dry.  The final components were cemented in place, size 2.5 femur, size 2.5 tibia, a 10-mm bridging bearing trial was placed, and a 35-mm all poly patella.  Once this was done, the tourniquet was let down.  After the cement was hardened, all excess bone cement was removed.  All bleeders were controlled with electrocautery.  Once this was done, the final poly was put in place and the final alignment was checked with the  computer assistance and computer assistance was then removed.  The medial parapatellar arthrotomy was then closed with a running Vicryl, the skin with 0 and 2-0 Vicryl and then 3-0 Monocryl for the subcu layer.  This was closed over a medium Hemovac drain.  The patient was then taken to recovery room, where she was noted to be in satisfactory condition.  Estimated blood loss for the procedure was none.     Harvie Junior, M.D.     Ranae Plumber  D:  11/01/2011  T:  11/01/2011  Job:  [id number]

## 2011-11-01 NOTE — Anesthesia Preprocedure Evaluation (Addendum)
Anesthesia Evaluation  Patient identified by MRN, date of birth, ID band Patient awake    Reviewed: Allergy & Precautions, H&P , NPO status , Patient's Chart, lab work & pertinent test results, reviewed documented beta blocker date and time   History of Anesthesia Complications Negative for: history of anesthetic complications  Airway Mallampati: II TM Distance: >3 FB Neck ROM: Full    Dental No notable dental hx. (+) Edentulous Upper, Partial Lower and Dental Advisory Given   Pulmonary former smoker (quit 20 years) breath sounds clear to auscultation  Pulmonary exam normal       Cardiovascular hypertension, Pt. on medications Rhythm:Regular Rate:Normal     Neuro/Psych Anxiety    GI/Hepatic Neg liver ROS, PUD, GERD-  Medicated and Controlled,  Endo/Other  Morbid obesity  Renal/GU negative Renal ROS     Musculoskeletal  (+) Arthritis - (daily vicodin),   Abdominal (+) + obese,   Peds  Hematology negative hematology ROS (+)   Anesthesia Other Findings Lower cap.   Reproductive/Obstetrics                         Anesthesia Physical Anesthesia Plan  ASA: II  Anesthesia Plan: General   Post-op Pain Management:    Induction: Intravenous  Airway Management Planned: LMA  Additional Equipment:   Intra-op Plan:   Post-operative Plan:   Informed Consent: I have reviewed the patients History and Physical, chart, labs and discussed the procedure including the risks, benefits and alternatives for the proposed anesthesia with the patient or authorized representative who has indicated his/her understanding and acceptance.   Dental advisory given  Plan Discussed with: CRNA and Surgeon  Anesthesia Plan Comments: (Plan routine monitors, GA- LMA OK, femoral nerve block for post op analgesia)        Anesthesia Quick Evaluation

## 2011-11-01 NOTE — Progress Notes (Signed)
Orthopedic Tech Progress Note Patient Details:  [name] 13-May-1931 [id number]  CPM Left Knee CPM Left Knee: On Left Knee Flexion (Degrees): 50  Left Knee Extension (Degrees): 0  Additional Comments: trapeze bar   Shawnie Pons 11/01/2011, 1:06 PM

## 2011-11-02 LAB — BASIC METABOLIC PANEL
Calcium: 8.9 mg/dL (ref 8.4–10.5)
GFR calc Af Amer: 45 mL/min — ABNORMAL LOW (ref >90)
GFR calc non Af Amer: 39 mL/min — ABNORMAL LOW (ref >90)
Potassium: 4.1 mEq/L (ref 3.5–5.1)
Sodium: 135 mEq/L (ref 135–145)

## 2011-11-02 LAB — CBC
MCH: 28.9 pg (ref 26.0–34.0)
MCHC: 33.8 g/dL (ref 30.0–36.0)
Platelets: 333 10*3/uL (ref 150–400)

## 2011-11-02 LAB — URINE CULTURE

## 2011-11-02 LAB — PROTIME-INR
INR: 1.06 (ref 0.00–1.49)
Prothrombin Time: 14 seconds (ref 11.6–15.2)

## 2011-11-02 MED ORDER — WARFARIN SODIUM 5 MG PO TABS
5.0000 mg | ORAL_TABLET | Freq: Once | ORAL | Status: AC
  Administered 2011-11-02: 5 mg via ORAL
  Filled 2011-11-02: qty 1

## 2011-11-02 MED ORDER — WHITE PETROLATUM GEL
Status: AC
  Administered 2011-11-02: 13:00:00
  Filled 2011-11-02: qty 5

## 2011-11-02 NOTE — Progress Notes (Signed)
PT Treatment Note     11/02/11 1600  PT Visit Information  Last PT Received On 11/02/11  Assistance Needed +1  PT Time Calculation  PT Start Time 1540  PT Stop Time 1602  PT Time Calculation (min) 22 min  Subjective Data  Subjective that knee immobilizer hurt  Patient Stated Goal to return home with daughter  Precautions  Precautions Knee  Precaution Booklet Issued No  Precaution Comments no pillow under knee (knees of bed locked out)  Required Braces or Orthoses Knee Immobilizer - Left  Knee Immobilizer - Left On when out of bed or walking  Restrictions  Weight Bearing Restrictions No  LLE Weight Bearing WBAT  Cognition  Overall Cognitive Status Appears within functional limits for tasks assessed/performed  Arousal/Alertness Awake/alert  Orientation Level Appears intact for tasks assessed  Behavior During Session [facility] for tasks performed  Exercises  Exercises Total Joint  Total Joint Exercises  Ankle Circles/Pumps AROM;Both;20 reps;Supine  Quad Sets AROM;Strengthening;Both;10 reps;Supine;Limitations  Short Arc Quad Left;10 reps;AAROM;Supine;Strengthening  Heel Slides AAROM;Left;10 reps;Supine;Strengthening  Hip ABduction/ADduction AAROM;Strengthening;Left;10 reps;Supine  Straight Leg Raises AAROM;Strengthening;Left;10 reps;Supine  Bridges 5 reps;AROM;Supine  Quad Sets Limitations left quad not firing this afternoon  PT - End of Session  Equipment Utilized During Treatment Other (comment) (CPM)  Activity Tolerance Patient tolerated treatment well  Patient left in bed;in CPM;with call bell/phone within reach  Nurse Communication Other (comment) (CPM status)  PT - Assessment/Plan  Comments on Treatment Session Pt performed exercises in bed this afternoon and tolerated well.  Is having more posterior knee pain than anterior, ie difficulty with full extension.  She was left in the CPM 0-50 deg.  PT will continue to follow.  PT Plan Discharge plan remains  appropriate;Frequency remains appropriate  PT Frequency 7X/week  Recommendations for Other Services OT consult  Follow Up Recommendations Home health PT;Supervision - Intermittent  Equipment Recommended None recommended by PT  Acute Rehab PT Goals  PT Goal Formulation With patient  Time For Goal Achievement 11/09/11  Potential to Achieve Goals Good  Pt will go Supine/Side to Sit with modified independence;with HOB 0 degrees  Pt will go Sit to Supine/Side with modified independence;with HOB 0 degrees  Pt will go Sit to Stand with modified independence  Pt will go Stand to Sit with modified independence  Pt will Ambulate >150 feet;with modified independence;with rolling walker  Pt will Perform Home Exercise Program Independently  PT Goal: Perform Home Exercise Program - Progress Progressing toward goal   Lyanne Co, PT  Acute Rehab Services  [phone]

## 2011-11-02 NOTE — Progress Notes (Signed)
Patient with minimal left knee pain  BP 105/48  Pulse 90  Temp(Src) 100.1 F (37.8 C) (Oral)  Resp 14  Ht 5\' 7"  (1.702 m)  Wt 82.555 kg (182 lb)  BMI 28.51 kg/m2  SpO2 99%  Dressing in place NVI  Hg 9.4 Cr. 1.29  POD #1 after left TKA  - follow creatinine - give fluids - PT, OT - coumadin, SCDs

## 2011-11-02 NOTE — Evaluation (Signed)
Physical Therapy Evaluation Patient Details Name: [name] MRN: [id number] DOB: April 20, 1931 Today's Date: 11/02/2011 Time: 4098-1191 PT Time Calculation (min): 20 min  PT Assessment / Plan / Recommendation Clinical Impression  Pt is 78 yo female with left TKA who had right Tka a year ago. Mobility today limited by pain but pt did tolerate 10' of ambulation with RW and min A.  Recommend HHPT after d/c, PT to follow acutely.    PT Assessment  Patient needs continued PT services    Follow Up Recommendations  Home health PT;Supervision - Intermittent    Barriers to Discharge Decreased caregiver support daughter works but pt reports that she can call her daughter during the day if she needs her and she can come home.      lEquipment Recommendations  None recommended by PT    Recommendations for Other Services OT consult   Frequency 7X/week    Precautions / Restrictions Precautions Precautions: Knee Precaution Comments: no pillow under knee Required Braces or Orthoses: Knee Immobilizer - Left Knee Immobilizer - Left: On when out of bed or walking Restrictions Weight Bearing Restrictions: No LLE Weight Bearing: Weight bearing as tolerated   Pertinent Vitals/Pain 6/10 left knee      Mobility  Bed Mobility Bed Mobility: Rolling Right;Right Sidelying to Sit;Sitting - Scoot to Edge of Bed Rolling Right: 5: Supervision;Other (comment) (pt used her leg lifter) Right Sidelying to Sit: 4: Min assist;HOB flat Sitting - Scoot to Edge of Bed: 4: Min assist Details for Bed Mobility Assistance: pt brought her leg lifter from last TKA and used it to get to side of bed.  Min A given to support leg once it was off the bed, for comfort only Transfers Transfers: Sit to Stand;Stand to Sit Sit to Stand: 4: Min assist;With upper extremity assist;From bed Stand to Sit: 4: Min assist;To chair/3-in-1;With upper extremity assist Details for Transfer Assistance: vc's for hand placement with RW and  WBing through RLE, min A given for initial push off of mattress Ambulation/Gait Ambulation/Gait Assistance: 4: Min assist Ambulation Distance (Feet): 10 Feet Assistive device: Rolling walker Ambulation/Gait Assistance Details: vc's for sequencing, min A to steady. Pt with increased pain with ambulation and one episode of left knee buckling within immobilizer. Gait Pattern: Step-to pattern;Decreased step length - left Gait velocity: decreased Stairs: No Wheelchair Mobility Wheelchair Mobility: No    Exercises Total Joint Exercises Ankle Circles/Pumps: AROM;Both;10 reps;Seated Quad Sets: AROM;Both;10 reps;Seated   PT Diagnosis: Difficulty walking;Abnormality of gait;Acute pain  PT Problem List: Decreased strength;Decreased range of motion;Decreased activity tolerance;Decreased mobility;Decreased knowledge of use of DME;Decreased knowledge of precautions;Pain PT Treatment Interventions: DME instruction;Gait training;Functional mobility training;Therapeutic activities;Therapeutic exercise;Patient/family education;Balance training   PT Goals Acute Rehab PT Goals PT Goal Formulation: With patient Time For Goal Achievement: 11/09/11 Potential to Achieve Goals: Good Pt will go Supine/Side to Sit: with modified independence;with HOB 0 degrees PT Goal: Supine/Side to Sit - Progress: Goal set today Pt will go Sit to Supine/Side: with modified independence;with HOB 0 degrees PT Goal: Sit to Supine/Side - Progress: Goal set today Pt will go Sit to Stand: with modified independence PT Goal: Sit to Stand - Progress: Goal set today Pt will go Stand to Sit: with modified independence PT Goal: Stand to Sit - Progress: Goal set today Pt will Ambulate: >150 feet;with modified independence;with rolling walker PT Goal: Ambulate - Progress: Goal set today Pt will Perform Home Exercise Program: Independently PT Goal: Perform Home Exercise Program - Progress: Goal set  today  Visit Information  Last PT  Received On: 11/02/11 Assistance Needed: +1    Subjective Data  Subjective: I felt OK until I saw you coming Patient Stated Goal: to return home with daughter   Prior Functioning  Home Living Lives With: Daughter Available Help at Discharge: Family;Available PRN/intermittently Type of Home: House Home Access: Level entry Home Layout: One level Home Adaptive Equipment: Walker - rolling;Bedside commode/3-in-1;Tub transfer bench Prior Function Level of Independence: Independent Able to Take Stairs?: Yes Vocation: Retired Musician: No difficulties    Cognition  Overall Cognitive Status: Appears within functional limits for tasks assessed/performed Arousal/Alertness: Awake/alert Orientation Level: Appears intact for tasks assessed Behavior During Session: [facility] for tasks performed    Extremity/Trunk Assessment Right Lower Extremity Assessment RLE ROM/Strength/Tone: WFL for tasks assessed RLE Sensation: WFL - Light Touch;WFL - Proprioception RLE Coordination: WFL - gross/fine motor Left Lower Extremity Assessment LLE ROM/Strength/Tone: Deficits;Due to pain LLE ROM/Strength/Tone Deficits: quad 1/5, hip flex 1/5 LLE Sensation:  (NT) Trunk Assessment Trunk Assessment: Normal   Balance Balance Balance Assessed: No  End of Session PT - End of Session Equipment Utilized During Treatment: Gait belt;Left knee immobilizer Activity Tolerance: Patient limited by pain Patient left: in chair;with call bell/phone within reach Nurse Communication: Mobility status   Vidalia Serpas, [place] 11/02/2011, 11:56 AM  Lyanne Co, PT  Acute Rehab Services  [phone]

## 2011-11-02 NOTE — Progress Notes (Signed)
ANTICOAGULATION CONSULT NOTE - Initial Consult  Pharmacy Consult for Coumadin Indication: VTE prophylaxis  Allergies  Allergen Reactions  . Nsaids Other (See Comments)    REACTION: intolerant due to reflux, history of ulcers    Patient Measurements: Height: 5\' 7"  (170.2 cm) Weight: 182 lb (82.555 kg) IBW/kg (Calculated) : 61.6   Vital Signs: Temp: 100.1 F (37.8 C) (05/11 0450) BP: 100/58 mmHg (05/11 0700) Pulse Rate: 90  (05/11 0450)  Labs:  Basename 11/02/11 0532  HGB 9.4*  HCT 27.8*  PLT 333  APTT --  LABPROT 14.0  INR 1.06  HEPARINUNFRC --  CREATININE 1.29*  CKTOTAL --  CKMB --  TROPONINI --    Estimated Creatinine Clearance: 39.7 ml/min (by C-G formula based on Cr of 1.29).   Medical History: Past Medical History  Diagnosis Date  . PUD (peptic ulcer disease)   . Dyskinesia of esophagus   . Unspecified essential hypertension   . Esophageal reflux   . Anemia   . Anxiety   . Arthritis   . Blood transfusion   . Cataract     surgery 2008  . Stress incontinence     Medications:  Scheduled:     . acetaminophen  1,000 mg Intravenous Q6H  . amLODipine  5 mg Oral Daily  .  ceFAZolin (ANCEF) IV  2 g Intravenous Q6H  . coumadin book   Does not apply Once  . DULoxetine  60 mg Oral Daily  . ferrous sulfate  325 mg Oral BID WC  . fesoterodine  8 mg Oral Daily  . hydrochlorothiazide  12.5 mg Oral Daily  . HYDROmorphone      . irbesartan  300 mg Oral Daily  . morphine   Intravenous Q4H  . pantoprazole  80 mg Oral Q1200  . polyethylene glycol  17 g Oral Daily  . potassium chloride SA  20 mEq Oral Daily  . senna-docusate  1 tablet Oral Daily  . warfarin  5 mg Oral ONCE-1800  . warfarin   Does not apply Once  . Warfarin - Pharmacist Dosing Inpatient   Does not apply q1800  . white petrolatum       Admit Complaint: 78 y.o.  female  admitted 11/01/2011 s/p L TKA.  Pharmacy consulted to dose warfarin  Assessment: Anticoagulation: DVT Px: INR  essentially unchanged after first 5 mg dose, no bleeding noted, H/H  9.4/27.8 Best Practices: DVT Prophylaxis:  warfarin  Goal of Therapy:  INR 2-3 Monitor platelets by anticoagulation protocol: Yes   Plan:  1.  Coumadin 5mg  tonight 2.  Daily INR 3.  Coumadin book & video   Thank you for allowing pharmacy to be a part of this patients care team.  Lovenia Kim Pharm.D., BCPS Clinical Pharmacist 11/02/2011 2:45 PM Pager: [phone] Phone: [phone]

## 2011-11-03 LAB — PROTIME-INR
INR: 1.61 — ABNORMAL HIGH (ref 0.00–1.49)
Prothrombin Time: 19.4 seconds — ABNORMAL HIGH (ref 11.6–15.2)

## 2011-11-03 LAB — CBC
MCHC: 34.1 g/dL (ref 30.0–36.0)
Platelets: 298 10*3/uL (ref 150–400)
RDW: 13.2 % (ref 11.5–15.5)

## 2011-11-03 LAB — BASIC METABOLIC PANEL
BUN: 13 mg/dL (ref 6–23)
CO2: 23 mEq/L (ref 19–32)
CO2: 27 mEq/L (ref 19–32)
Calcium: 9.5 mg/dL (ref 8.4–10.5)
Calcium: 9.6 mg/dL (ref 8.4–10.5)
Chloride: 99 mEq/L (ref 96–112)
Creatinine, Ser: 0.84 mg/dL (ref 0.50–1.10)
GFR calc non Af Amer: 63 mL/min — ABNORMAL LOW (ref >90)
Glucose, Bld: 116 mg/dL — ABNORMAL HIGH (ref 70–99)
Glucose, Bld: 126 mg/dL — ABNORMAL HIGH (ref 70–99)
Potassium: 4.2 mEq/L (ref 3.5–5.1)
Sodium: 135 mEq/L (ref 135–145)

## 2011-11-03 MED ORDER — WARFARIN SODIUM 5 MG PO TABS
5.0000 mg | ORAL_TABLET | Freq: Once | ORAL | Status: AC
  Administered 2011-11-03: 5 mg via ORAL
  Filled 2011-11-03: qty 1

## 2011-11-03 MED ORDER — WARFARIN SODIUM 5 MG PO TABS: ORAL_TABLET | ORAL | Status: DC

## 2011-11-03 MED ORDER — OXYCODONE-ACETAMINOPHEN 5-325 MG PO TABS: 1.0000 | ORAL_TABLET | ORAL | Status: AC | PRN

## 2011-11-03 MED ORDER — METHOCARBAMOL 500 MG PO TABS: 500.0000 mg | ORAL_TABLET | Freq: Four times a day (QID) | ORAL | Status: AC | PRN

## 2011-11-03 NOTE — Progress Notes (Signed)
ANTICOAGULATION CONSULT NOTE - Initial Consult  Pharmacy Consult for Coumadin Indication: VTE prophylaxis  Allergies  Allergen Reactions  . Nsaids Other (See Comments)    REACTION: intolerant due to reflux, history of ulcers    Patient Measurements: Height: 5\' 7"  (170.2 cm) Weight: 182 lb (82.555 kg) IBW/kg (Calculated) : 61.6   Vital Signs: Temp: 99.8 F (37.7 C) (05/12 0544) Temp src: Oral (05/12 0544) BP: 121/66 mmHg (05/12 0544) Pulse Rate: 92  (05/12 0544)  Labs:  Basename 11/03/11 1012 11/03/11 0528 11/02/11 0532  HGB -- 8.4* 9.4*  HCT -- 24.6* 27.8*  PLT -- 298 333  APTT -- -- --  LABPROT -- 19.4* 14.0  INR -- 1.61* 1.06  HEPARINUNFRC -- -- --  CREATININE 0.86 0.84 1.29*  CKTOTAL -- -- --  CKMB -- -- --  TROPONINI -- -- --    Estimated Creatinine Clearance: 59.6 ml/min (by C-G formula based on Cr of 0.86).   Medical History: Past Medical History  Diagnosis Date  . PUD (peptic ulcer disease)   . Dyskinesia of esophagus   . Unspecified essential hypertension   . Esophageal reflux   . Anemia   . Anxiety   . Arthritis   . Blood transfusion   . Cataract     surgery 2008  . Stress incontinence     Medications:  Scheduled:     . amLODipine  5 mg Oral Daily  . DULoxetine  60 mg Oral Daily  . ferrous sulfate  325 mg Oral BID WC  . fesoterodine  8 mg Oral Daily  . hydrochlorothiazide  12.5 mg Oral Daily  . irbesartan  300 mg Oral Daily  . pantoprazole  80 mg Oral Q1200  . polyethylene glycol  17 g Oral Daily  . potassium chloride SA  20 mEq Oral Daily  . senna-docusate  1 tablet Oral Daily  . warfarin  5 mg Oral ONCE-1800  . warfarin   Does not apply Once  . Warfarin - Pharmacist Dosing Inpatient   Does not apply q1800  . DISCONTD: morphine   Intravenous Q4H   Admit Complaint: 78 y.o.  female  admitted 11/01/2011 s/p L TKA.  Pharmacy consulted to dose warfarin  Assessment: Anticoagulation: DVT Px: INR up to 1.6 after first 2, 5 mg doses, no  bleeding noted, H/H  Continues to decline. Best Practices: DVT Prophylaxis:  warfarin  Goal of Therapy:  INR 2-3 Monitor platelets by anticoagulation protocol: Yes   Plan:  1.  Coumadin 5mg  tonight 2.  Daily INR 3.  Coumadin book & video   Thank you for allowing pharmacy to be a part of this patients care team.  Lovenia Kim Pharm.D., BCPS Clinical Pharmacist 11/03/2011 2:22 PM Pager: [phone] Phone: [phone]

## 2011-11-03 NOTE — Progress Notes (Signed)
Physical Therapy Treatment Patient Details Name: [name] MRN: [id number] DOB: May 20, 1931 Today's Date: 11/03/2011 Time: 2130-8657 PT Time Calculation (min): 34 min  PT Assessment / Plan / Recommendation Comments on Treatment Session  Pt admitted s/p left TKA and is motivated.  Pt limited by pain, but still able to increase ambulation distance/independence.    Follow Up Recommendations  Home health PT;Supervision - Intermittent    Barriers to Discharge        Equipment Recommendations  None recommended by PT    Recommendations for Other Services OT consult  Frequency 7X/week   Plan Discharge plan remains appropriate;Frequency remains appropriate    Precautions / Restrictions Precautions Precautions: Knee Precaution Booklet Issued: No Precaution Comments: no pillow under knee Required Braces or Orthoses: Knee Immobilizer - Left Knee Immobilizer - Left: On when out of bed or walking Restrictions Weight Bearing Restrictions: Yes LLE Weight Bearing: Weight bearing as tolerated   Pertinent Vitals/Pain 8/10 in left knee.  Pt repositioned and RN made aware.    Mobility  Bed Mobility Bed Mobility: Supine to Sit;Sit to Supine Supine to Sit: 4: Min assist;With rails Sit to Supine: 4: Min assist;With rail Details for Bed Mobility Assistance: Assist for left LE due to pain with cues for sequence. Transfers Transfers: Sit to Stand;Stand to Sit (2 trials.) Sit to Stand: 4: Min assist;With upper extremity assist;From bed;From chair/3-in-1 Stand to Sit: 4: Min assist;With upper extremity assist;To chair/3-in-1;To bed Details for Transfer Assistance: Assist for balance with cues for safest hand/left LE placement. Ambulation/Gait Ambulation/Gait Assistance: 4: Min guard Ambulation Distance (Feet): 40 Feet (20 feet x 2 trials.  Distance limited by pain.) Assistive device: Rolling walker Ambulation/Gait Assistance Details: Assist for balance with cues for tall posture and attempted  step-through sequence.  Distance greatly limited by pain. Gait Pattern: Step-to pattern;Decreased step length - left;Decreased stance time - left Stairs: No Wheelchair Mobility Wheelchair Mobility: No    Exercises Total Joint Exercises Ankle Circles/Pumps: AROM;Left;10 reps;Supine Quad Sets: AROM;Left;10 reps;Supine Heel Slides: AAROM;Left;10 reps;Supine Hip ABduction/ADduction: AAROM;Left;10 reps;Supine Straight Leg Raises: AAROM;Left;10 reps;Supine Goniometric ROM: AA/ROM left knee 0-65 degrees.   PT Diagnosis:    PT Problem List:   PT Treatment Interventions:     PT Goals Acute Rehab PT Goals PT Goal Formulation: With patient Time For Goal Achievement: 11/09/11 Potential to Achieve Goals: Good PT Goal: Supine/Side to Sit - Progress: Progressing toward goal PT Goal: Sit to Supine/Side - Progress: Progressing toward goal PT Goal: Sit to Stand - Progress: Progressing toward goal PT Goal: Stand to Sit - Progress: Progressing toward goal PT Goal: Ambulate - Progress: Progressing toward goal PT Goal: Perform Home Exercise Program - Progress: Progressing toward goal  Visit Information  Last PT Received On: 11/03/11 Assistance Needed: +1    Subjective Data  Subjective: "I think I may need some pain medicine." Patient Stated Goal: to return home with daughter   Cognition  Overall Cognitive Status: Appears within functional limits for tasks assessed/performed Arousal/Alertness: Awake/alert Orientation Level: Appears intact for tasks assessed Behavior During Session: [facility] for tasks performed    Balance  Balance Balance Assessed: No  End of Session PT - End of Session Equipment Utilized During Treatment: Gait belt;Left knee immobilizer Activity Tolerance: Patient tolerated treatment well;Patient limited by pain Patient left: in bed;in CPM;with call bell/phone within reach (CPM 0-45 degrees.) Nurse Communication: Mobility status;Weight bearing status;Patient requests pain meds     Cephus Shelling 11/03/2011, 9:27 AM  11/03/2011 Cephus Shelling, PT,  DPT [phone]

## 2011-11-03 NOTE — Progress Notes (Addendum)
PT Treatment Note:   11/03/11 1139  PT Visit Information  Last PT Received On 11/03/11  Assistance Needed +1  PT Time Calculation  PT Start Time 1105  PT Stop Time 1137  PT Time Calculation (min) 32 min  Subjective Data  Subjective "I don't know how much I can do."  Patient Stated Goal to return home with daughter  Precautions  Precautions Knee  Precaution Booklet Issued No  Precaution Comments no pillow under knee  Required Braces or Orthoses Knee Immobilizer - Left  Knee Immobilizer - Left On when out of bed or walking  Restrictions  Weight Bearing Restrictions Yes  LLE Weight Bearing WBAT  Cognition  Overall Cognitive Status Appears within functional limits for tasks assessed/performed  Arousal/Alertness Awake/alert  Orientation Level Appears intact for tasks assessed  Behavior During Session [facility] for tasks performed  Bed Mobility  Bed Mobility Supine to Sit;Sit to Supine  Supine to Sit 4: Min assist;With rails;HOB flat  Sit to Supine 4: Min assist;With rail;HOB flat  Details for Bed Mobility Assistance Assist to left LE due to pain.  Transfers  Transfers Sit to Stand;Stand to Sit (2 trials.)  Sit to Stand 4: Min guard;With upper extremity assist;From bed;From chair/3-in-1  Stand to Sit 4: Min guard;With upper extremity assist;To bed;To chair/3-in-1  Details for Transfer Assistance Guarding for balance with cues for hand placement.  Ambulation/Gait  Ambulation/Gait Assistance 5: Supervision  Ambulation Distance (Feet) 40 Feet (20 feet x 2 trials.)  Assistive device Rolling walker  Ambulation/Gait Assistance Details Verbal cues for tall posture.  Pt still unable to tolerate increased distance due to nausea with ambulation.  Pt even dry heaving this trial of ambulation.  RN aware.  Gait Pattern Step-to pattern;Decreased step length - left;Decreased stance time - left  Stairs No  Wheelchair Mobility  Wheelchair Mobility No  Balance  Balance Assessed No  PT - End of  Session  Equipment Utilized During Treatment Gait belt;Left knee immobilizer  Activity Tolerance Patient tolerated treatment well;Treatment limited secondary to medical complications (Comment) (Increased nausea with ambulation limiting distance.)  Patient left in bed;with call bell/phone within reach;Other (comment) (Pt declined OOB to chair due to nausea.)  Nurse Communication Mobility status;Weight bearing status;Other (comment) (RN aware of pt's nausea.)  PT - Assessment/Plan  Comments on Treatment Session Pt admitted s/p left TKA and continues to progress with independence.  Limited by nausea causing decreased ambulation distance.  Will continue to follow.  PT Plan Discharge plan remains appropriate;Frequency remains appropriate  PT Frequency 7X/week  Follow Up Recommendations Home health PT;Supervision - Intermittent  Equipment Recommended None recommended by PT  Acute Rehab PT Goals  PT Goal Formulation With patient  Time For Goal Achievement 11/09/11  Potential to Achieve Goals Good  PT Goal: Supine/Side to Sit - Progress Progressing toward goal  PT Goal: Sit to Supine/Side - Progress Progressing toward goal  PT Goal: Sit to Stand - Progress Progressing toward goal  PT Goal: Stand to Sit - Progress Progressing toward goal  PT Goal: Ambulate - Progress Progressing toward goal    Pain:  6/10 in left knee with activity.  Pt repositioned.  11/03/2011 Cephus Shelling, PT, DPT [phone]

## 2011-11-03 NOTE — Progress Notes (Signed)
Patient with minimal left knee pain   BP 121/66  Pulse 92  Temp(Src) 99.8 F (37.7 C) (Oral)  Resp 16  Ht 5\' 7"  (1.702 m)  Wt 82.555 kg (182 lb)  BMI 28.51 kg/m2  SpO2 93%  Dressing in place  NVI  Hg 8.4  Cr. 1.29  Yesterday, decreased to 0.86 Drain output minimal --> d/c'ed  POD #2 after left TKA  - creatinine decreased appropriately - PT, OT  - coumadin, SCDs - patient requesting to go home tomorrow

## 2011-11-04 ENCOUNTER — Encounter (HOSPITAL_COMMUNITY): Payer: Self-pay | Admitting: Orthopedic Surgery

## 2011-11-04 LAB — CBC
HCT: 22.8 % — ABNORMAL LOW (ref 36.0–46.0)
Hemoglobin: 7.7 g/dL — ABNORMAL LOW (ref 12.0–15.0)
MCHC: 33.8 g/dL (ref 30.0–36.0)
MCV: 85.4 fL (ref 78.0–100.0)
RDW: 13.1 % (ref 11.5–15.5)
WBC: 6.6 10*3/uL (ref 4.0–10.5)

## 2011-11-04 LAB — BASIC METABOLIC PANEL
BUN: 13 mg/dL (ref 6–23)
Chloride: 98 mEq/L (ref 96–112)
Creatinine, Ser: 0.8 mg/dL (ref 0.50–1.10)
GFR calc Af Amer: 80 mL/min — ABNORMAL LOW (ref >90)
Glucose, Bld: 129 mg/dL — ABNORMAL HIGH (ref 70–99)
Potassium: 4.1 mEq/L (ref 3.5–5.1)

## 2011-11-04 LAB — PROTIME-INR: INR: 2.03 — ABNORMAL HIGH (ref 0.00–1.49)

## 2011-11-04 NOTE — Discharge Instructions (Signed)
Total Knee Replacement Care After Refer to this sheet in the next few weeks. These discharge instructions provide you with general information on caring for yourself after you leave the hospital. Your caregiver may also give you specific instructions. Your treatment has been planned according to the most current medical practices available, but unavoidable complications sometimes occur. If you have any problems or questions after discharge, please call your caregiver. Regaining a near full range of motion of your knee within the first 3 to 6 weeks after surgery is critical. HOME CARE INSTRUCTIONS   You may resume a normal diet and activities as directed. Perform exercises as directed.   You will receive physical therapy as directed by your caregiver.   Take showers instead of baths until informed otherwise.   Change bandages (dressings) if necessary or as directed.   Only take over-the-counter or prescription medicines for pain, discomfort, or fever as directed by your caregiver.   Eat a well-balanced diet.   Avoid lifting or driving until you are instructed otherwise.   Make an appointment to see your caregiver for stitches (suture) or staple removal as directed.   If you have been sent home with a continuous passive motion machine (CPM machine), use as directed.  SEEK MEDICAL CARE IF:  You have swelling of your calf or leg.   You develop shortness of breath or chest pain.   You have redness, swelling, or increasing pain in the wound.   There is pus or any unusual drainage coming from the surgical site.   You notice a bad smell coming from the surgical site or dressing.   The surgical site breaks open after sutures or staples have been removed.   There is persistent bleeding from the suture or staple line.   You are getting worse or are not improving.   You have any other questions or concerns.  SEEK IMMEDIATE MEDICAL CARE IF:   You have a fever.   You develop a rash.     You have difficulty breathing.   You develop any reaction or side effects to medicines given.   Your knee motion is decreasing rather than improving.  MAKE SURE YOU:   Understand these instructions.   Will watch your condition.   Will get help right away if you are not doing well or get worse.  Document Released: 12/28/2004 Document Revised: 05/30/2011 Document Reviewed: 04/12/2009 [facility] Patient Information 2012 [place], [place].   Home health to check CBC in 2-3 days.                        Home Health to be provided by [facility] [phone]

## 2011-11-04 NOTE — Progress Notes (Signed)
Agree with treatment session.  11/04/2011 Travin Marik M Stellah Donovan, PT, DPT [phone]   

## 2011-11-04 NOTE — Progress Notes (Signed)
Physical Therapy Treatment Patient Details Name: [name] MRN: [id number] DOB: 04-Oct-1930 Today's Date: 11/04/2011 Time: 9528-4132 PT Time Calculation (min): 30 min  PT Assessment / Plan / Recommendation Comments on Treatment Session  Pt admitted s/p L TKA and is progressing well. Ambulated greater distance this morning and feeling much better. Pt is ready for a safe D/C home to HHPT once medically cleared by MD. Will see for PM treat to maximize independence.     Follow Up Recommendations  Home health PT;Supervision - Intermittent    Barriers to Discharge        Equipment Recommendations  None recommended by PT    Recommendations for Other Services    Frequency 7X/week   Plan Discharge plan remains appropriate;Frequency remains appropriate    Precautions / Restrictions Precautions Precautions: Knee Precaution Booklet Issued: No Precaution Comments: no pillow under knee Required Braces or Orthoses: Knee Immobilizer - Left Knee Immobilizer - Left: On when out of bed or walking Restrictions Weight Bearing Restrictions: Yes LLE Weight Bearing: Weight bearing as tolerated   Pertinent Vitals/Pain Pt reports 4/10 pain at L knee. Pt premedicated by RN.      Mobility  Bed Mobility Bed Mobility: Not assessed Transfers Transfers: Sit to Stand;Stand to Sit (Trials x 2) Sit to Stand: 5: Supervision;With upper extremity assist;With armrests;From chair/3-in-1 Stand to Sit: 5: Supervision;With upper extremity assist;With armrests;To chair/3-in-1 Details for Transfer Assistance: Cues for slowly lowering back to chair and L LE placement.  Ambulation/Gait Ambulation/Gait Assistance: 5: Supervision Ambulation Distance (Feet): 60 Feet Assistive device: Rolling walker Ambulation/Gait Assistance Details: Cueing for tall posture and step through pattern.  Gait Pattern: Step-to pattern;Step-through pattern;Decreased step length - left;Decreased stance time - left Stairs: No Wheelchair  Mobility Wheelchair Mobility: No    Exercises Total Joint Exercises Ankle Circles/Pumps: AROM;Left;10 reps;Seated Quad Sets: AROM;Left;10 reps;Seated Short Arc Quad: Left;10 reps;Seated;AAROM Heel Slides: AAROM;Left;10 reps;Seated Hip ABduction/ADduction: AAROM;Left;10 reps;Seated Straight Leg Raises: AAROM;Left;10 reps;Seated Goniometric ROM: Knee AAROM 0-73 degrees   PT Diagnosis:    PT Problem List:   PT Treatment Interventions:     PT Goals Acute Rehab PT Goals PT Goal Formulation: With patient Time For Goal Achievement: 11/09/11 Potential to Achieve Goals: Good PT Goal: Sit to Stand - Progress: Progressing toward goal PT Goal: Stand to Sit - Progress: Progressing toward goal PT Goal: Ambulate - Progress: Progressing toward goal PT Goal: Perform Home Exercise Program - Progress: Progressing toward goal  Visit Information  Last PT Received On: 11/04/11 Assistance Needed: +1    Subjective Data  Subjective: "I'm feeling a lot better today." Patient Stated Goal: to return home with daughter   Cognition  Overall Cognitive Status: Appears within functional limits for tasks assessed/performed Arousal/Alertness: Awake/alert Orientation Level: Appears intact for tasks assessed Behavior During Session: [facility] for tasks performed    Balance  Balance Balance Assessed: No  End of Session PT - End of Session Equipment Utilized During Treatment: Gait belt;Left knee immobilizer Activity Tolerance: Patient tolerated treatment well Patient left: in chair;with call bell/phone within reach Nurse Communication: Mobility status    Oretha Ellis 11/04/2011, 10:36 AM

## 2011-11-04 NOTE — Progress Notes (Signed)
CARE MANAGEMENT NOTE 11/04/2011  Patient:  [name], [name]   Account Number:  [id number]  Date Initiated:  11/04/2011  Documentation initiated by:  Vance Peper  Subjective/Objective Assessment:   78 yr old female s/p left total knee arthroplasty     Action/Plan:   Spoke with patient regarding home health needs. Preoperatively setup with [facility]. No changes. patient states she has a rolling walker and 3in1, CPM to be delivered to the home.   Anticipated DC Date:  11/04/2011   Anticipated DC Plan:  HOME W HOME HEALTH SERVICES      DC Planning Services  CM consult      [facility]  HOME HEALTH   Choice offered to / List presented to:  C-1 Patient        HH arranged  HH-1 RN  HH-2 PT      [facility] agency  [facility]   Status of service:  Completed, signed off Medicare Important Message given?   (If response is "NO", the following Medicare IM given date fields will be blank) Date Medicare IM given:   Date Additional Medicare IM given:    Discharge Disposition:  HOME W HOME HEALTH SERVICES

## 2011-11-04 NOTE — Progress Notes (Signed)
Subjective: 3 Days Post-Op Procedure(s) (LRB): COMPUTER ASSISTED TOTAL KNEE ARTHROPLASTY (Left) Patient reports pain as moderate.    Objective: Vital signs in last 24 hours: Temp:  [99.2 F (37.3 C)-99.7 F (37.6 C)] 99.7 F (37.6 C) (05/13 0519) Pulse Rate:  [83-95] 83  (05/13 0519) Resp:  [16-18] 16  (05/13 0519) BP: (100-109)/(65-70) 107/65 mmHg (05/13 0519) SpO2:  [96 %-98 %] 97 % (05/13 0519)  Intake/Output from previous day: 05/12 0701 - 05/13 0700 In: 120 [P.O.:120] Out: -  Intake/Output this shift:     Basename 11/04/11 0550 11/03/11 0528 11/02/11 0532  HGB 7.7* 8.4* 9.4*    Basename 11/04/11 0550 11/03/11 0528  WBC 6.6 8.8  RBC 2.67* 2.90*  HCT 22.8* 24.6*  PLT 275 298    Basename 11/04/11 0550 11/03/11 1012  NA 133* 134*  K 4.1 4.0  CL 98 98  CO2 26 27  BUN 13 13  CREATININE 0.80 0.86  GLUCOSE 129* 126*  CALCIUM 9.4 9.6    Basename 11/04/11 0550 11/03/11 0528  LABPT -- --  INR 2.03* 1.61*   EXAM: Well-developed well-nourished patient in no acute distress. Alert and oriented x3 HEENT:within normal limits Cardiac: Regular rate and rhythm Pulmonary: Lungs clear to auscultation Abdomen: Soft and nontender.  Normal active bowel sounds  Musculoskeletal:L. leg  Neurologically intact ABD soft Neurovascular intact Sensation intact distally Intact pulses distally Dorsiflexion/Plantar flexion intact No cellulitis present Compartment soft  Assessment/Plan: 3 Days Post-Op Procedure(s) (LRB): COMPUTER ASSISTED TOTAL KNEE ARTHROPLASTY (Left) Acute blood loss anemia/Low HGB today at 7.7 no dizziness/SOB when up-- will observe for now but will make home health aware and have them monitor her and check HGB later this week if indicated  Advance diet Up with therapy Discharge home with home health  Gabryel Files L 11/04/2011, 7:47 AM

## 2011-11-04 NOTE — Progress Notes (Signed)
Physical Therapy Treatment Patient Details Name: [name] MRN: [id number] DOB: July 08, 1930 Today's Date: 11/04/2011 Time: 4098-1191 PT Time Calculation (min): 17 min  PT Assessment / Plan / Recommendation Comments on Treatment Session  Pt admitted s/p L TKA and is progressing well. Pt ambulated around the room, but refused to walk greater distance. Pt ready for safe D/C home to HHPT once medically cleared by MD.     Follow Up Recommendations  Home health PT;Supervision - Intermittent    Barriers to Discharge        Equipment Recommendations  None recommended by PT    Recommendations for Other Services    Frequency 7X/week   Plan Discharge plan remains appropriate;Frequency remains appropriate    Precautions / Restrictions Precautions Precautions: Knee Precaution Booklet Issued: No Precaution Comments: no pillow under knee Required Braces or Orthoses: Knee Immobilizer - Left Knee Immobilizer - Left: On when out of bed or walking Restrictions Weight Bearing Restrictions: Yes LLE Weight Bearing: Weight bearing as tolerated   Pertinent Vitals/Pain Pt reports 4/10 pain at L knee. Pt repositioned.     Mobility  Bed Mobility Bed Mobility: Supine to Sit;Sitting - Scoot to Edge of Bed Supine to Sit: 6: Modified independent (Device/Increase time);HOB elevated Sitting - Scoot to Edge of Bed: 6: Modified independent (Device/Increase time) Transfers Transfers: Sit to Stand;Stand to Sit (Trials x 2) Sit to Stand: 5: Supervision;With upper extremity assist;With armrests;From chair/3-in-1;From bed Stand to Sit: 5: Supervision;With upper extremity assist;With armrests;To chair/3-in-1 Details for Transfer Assistance: Cues for slowly lowering into chair and hand placement.  Ambulation/Gait Ambulation/Gait Assistance: 5: Supervision Ambulation Distance (Feet): 40 Feet Assistive device: Rolling walker Ambulation/Gait Assistance Details: Cueing for step through pattern and tall posture.   Gait Pattern: Step-to pattern;Step-through pattern;Decreased step length - left;Decreased stance time - left Stairs: No Wheelchair Mobility Wheelchair Mobility: No    Exercises     PT Diagnosis:    PT Problem List:   PT Treatment Interventions:     PT Goals Acute Rehab PT Goals PT Goal Formulation: With patient Time For Goal Achievement: 11/09/11 Potential to Achieve Goals: Good PT Goal: Supine/Side to Sit - Progress: Progressing toward goal PT Goal: Sit to Stand - Progress: Progressing toward goal PT Goal: Stand to Sit - Progress: Progressing toward goal PT Goal: Ambulate - Progress: Progressing toward goal  Visit Information  Last PT Received On: 11/04/11 Assistance Needed: +1    Subjective Data  Subjective: "I'm ready to go home." Patient Stated Goal: to return home with daughter   Cognition  Overall Cognitive Status: Appears within functional limits for tasks assessed/performed Arousal/Alertness: Awake/alert Orientation Level: Appears intact for tasks assessed Behavior During Session: [facility] for tasks performed    Balance  Balance Balance Assessed: No  End of Session PT - End of Session Equipment Utilized During Treatment: Gait belt;Left knee immobilizer Activity Tolerance: Patient tolerated treatment well Patient left: in chair;with call bell/phone within reach Nurse Communication: Mobility status    Oretha Ellis 11/04/2011, 2:31 PM

## 2011-11-04 NOTE — Discharge Summary (Signed)
Patient ID: [name] MRN: [id number] DOB/AGE: [birth year] 78 y.o.  Admit date: 11/01/2011 Discharge date: 11/04/2011  Admission Diagnoses:  Principal Problem:  *Osteoarthritis of left knee Active Problems:  IRON DEFICIENCY ANEMIA SECONDARY TO BLOOD LOSS   Discharge Diagnoses:  Same  Past Medical History  Diagnosis Date  . PUD (peptic ulcer disease)   . Dyskinesia of esophagus   . Unspecified essential hypertension   . Esophageal reflux   . Anemia   . Anxiety   . Arthritis   . Blood transfusion   . Cataract     surgery 2008  . Stress incontinence     Surgeries: Procedure(s):Left COMPUTER ASSISTED TOTAL KNEE ARTHROPLASTY on 11/01/2011    Discharged Condition: Improved  Hospital Course: [name] is an 78 y.o. female who was admitted 11/01/2011 for operative treatment ofOsteoarthritis of left knee. Patient has severe unremitting pain that affects sleep, daily activities, and work/hobbies. After pre-op clearance the patient was taken to the operating room on 11/01/2011 and underwent  Procedure(s):Left COMPUTER ASSISTED TOTAL KNEE ARTHROPLASTY.    Patient was given perioperative antibiotics: Anti-infectives     Start     Dose/Rate Route Frequency Ordered Stop   11/01/11 1500   ceFAZolin (ANCEF) IVPB 2 g/50 mL premix        2 g 100 mL/hr over 30 Minutes Intravenous Every 6 hours 11/01/11 1404 11/02/11 0348   11/01/11 0917   cefUROXime (ZINACEF) injection  Status:  Discontinued          As needed 11/01/11 0918 11/01/11 1157   10/31/11 1411   ceFAZolin (ANCEF) IVPB 2 g/50 mL premix        2 g 100 mL/hr over 30 Minutes Intravenous 60 min pre-op 10/31/11 1411 11/01/11 0930           Patient was given sequential compression devices, early ambulation, and chemoprophylaxis to prevent DVT.  Patient benefited maximally from hospital stay and there were no complications.    Recent vital signs: Patient Vitals for the past 24 hrs:  BP Temp Temp src Pulse Resp SpO2   11/04/11 0519 107/65 mmHg 99.7 F (37.6 C) Oral 83  16  97 %  12-02-2011 2214 109/70 mmHg 99.2 F (37.3 C) Oral 86  16  96 %  12-02-11 1500 100/65 mmHg - Oral 95  18  98 %     Recent laboratory studies:  Basename 11/04/11 0550 12-02-11 1012 12-02-11 0528  WBC 6.6 -- 8.8  HGB 7.7* -- 8.4*  HCT 22.8* -- 24.6*  PLT 275 -- 298  NA 133* 134* --  K 4.1 4.0 --  CL 98 98 --  CO2 26 27 --  BUN 13 13 --  CREATININE 0.80 0.86 --  GLUCOSE 129* 126* --  INR 2.03* -- 1.61*  CALCIUM 9.4 -- --     Discharge Medications:   Medication List  As of 11/04/2011  8:29 AM   STOP taking these medications         etodolac 300 MG capsule      HYDROcodone-acetaminophen 5-500 MG per tablet      traMADol 50 MG tablet         TAKE these medications         ALPRAZolam 0.5 MG tablet   Commonly known as: XANAX   Take 0.5 mg by mouth at bedtime as needed. For anxiety and sleep      chlorpheniramine-HYDROcodone 10-8 MG/5ML Lqcr   Commonly known as: TUSSIONEX  Take 5 mLs by mouth every 12 (twelve) hours as needed. For cough      desoximetasone 0.25 % cream   Commonly known as: TOPICORT   Apply 1 application topically 2 (two) times daily as needed. Apply to rash prn        DULoxetine 60 MG capsule   Commonly known as: CYMBALTA   Take 60 mg by mouth daily.      esomeprazole 40 MG capsule   Commonly known as: NEXIUM   Take 40 mg by mouth daily before breakfast.      ferrous sulfate 324 (65 FE) MG Tbec   Take by mouth 2 (two) times daily with a meal.      fexofenadine 180 MG tablet   Commonly known as: ALLEGRA   Take 180 mg by mouth daily as needed. For allergies      methocarbamol 500 MG tablet   Commonly known as: ROBAXIN   Take 1 tablet (500 mg total) by mouth every 6 (six) hours as needed (spasm).      metoCLOPramide 5 MG tablet   Commonly known as: REGLAN   Take 5 mg by mouth 3 (three) times daily with meals as needed.      MIRALAX powder   Generic drug: polyethylene glycol  powder   Take 17 g by mouth daily.      multivitamin, stress formula tablet   Take 1 tablet by mouth daily.      Olmesartan-Amlodipine-HCTZ 40-5-12.5 MG Tabs   Take 1 tablet by mouth daily.      oxyCODONE-acetaminophen 5-325 MG per tablet   Commonly known as: PERCOCET   Take 1-2 tablets by mouth every 4 (four) hours as needed for pain.      potassium chloride SA 20 MEQ tablet   Commonly known as: K-DUR,KLOR-CON   Take 20 mEq by mouth daily.      prednisoLONE acetate 1 % ophthalmic suspension   Commonly known as: PRED FORTE   Place 1 drop into both eyes 4 (four) times daily as needed. For eye irritation      PRESCRIPTION MEDICATION   Apply 1 application topically daily. Hypercare 20% external solution under arms daily      sennosides-docusate sodium 8.6-50 MG tablet   Commonly known as: SENOKOT-S   Take 1 tablet by mouth daily.      sucralfate 1 G tablet   Commonly known as: CARAFATE   Take 1 g by mouth 4 (four) times daily as needed. For stomach acid      TOVIAZ 8 MG Tb24   Generic drug: fesoterodine   Take by mouth daily.      warfarin 5 MG tablet   Commonly known as: COUMADIN   Take one tablet daily unless otherwise directed x 1 month post op. Shoot for INR of 2.0             Disposition: Home  Discharge Orders    Future Appointments: Provider: Department: Dept Phone: Center:   11/29/2011 9:15 AM Stacie Glaze, MD [facility] [phone] [facility]     Future Orders Please Complete By Expires   Diet general      Call MD / Call 911      Comments:   If you experience chest pain or shortness of breath, CALL 911 and be transported to the hospital emergency room.  If you develope a fever above 101 F, pus (white drainage) or increased drainage or redness at the wound, or calf pain, call your  surgeon's office.   Weight Bearing as taught in Physical Therapy      Comments:   Use a walker or crutches as instructed.   Discharge instructions      Comments:    Ambulate wgt bearing as tolerated on your operative leg.   CPM      Comments:   Continuous passive motion machine (CPM):      Use the CPM from 0 degrees to 60 degrees for 8 hours per day.      You may increase by5-10 degrees per day.  You may break it up into 2 or 3 sessions per day.      Use CPM for 1-2 weeks or until you are told to stop.   Do not put a pillow under the knee. Place it under the heel.        Have home health get CBC in 2-3 days. Follow-up Information    Follow up with GRAVES,JOHN L, MD. Schedule an appointment as soon as possible for a visit in 2 weeks.   Contact information:   [address] [place] [address] [phone]           Signed: Matthew Folks 11/04/2011, 8:29 AM

## 2011-11-05 ENCOUNTER — Telehealth: Payer: Self-pay | Admitting: Internal Medicine

## 2011-11-05 NOTE — Telephone Encounter (Signed)
Pts daughter called and said pt had surgery on Friday of last week. Pt is on coumadin and some other meds. Wants to know if meds are still ok to take. Also Dr Lovell Sheehan used to give pts samples of Tovaz 8mg  for urination frequency, need to know if pt can get some more samples?

## 2011-11-05 NOTE — Progress Notes (Signed)
Agree with treatment session.  11/05/2011 Cephus Shelling, PT, DPT [phone]

## 2011-11-05 NOTE — Telephone Encounter (Signed)
only on coumadin for 1 month and med regimen is ok per dr Juan Quam weeks of toviaz samples up front- daughter informed

## 2011-11-06 ENCOUNTER — Other Ambulatory Visit: Payer: Self-pay | Admitting: *Deleted

## 2011-11-06 MED ORDER — ALPRAZOLAM 0.5 MG PO TABS: 0.5000 mg | ORAL_TABLET | Freq: Every evening | ORAL | Status: DC | PRN

## 2011-11-28 ENCOUNTER — Ambulatory Visit: Payer: Medicare Other | Attending: Orthopedic Surgery | Admitting: Rehabilitative and Restorative Service Providers"

## 2011-11-28 DIAGNOSIS — S88119A Complete traumatic amputation at level between knee and ankle, unspecified lower leg, initial encounter: Secondary | ICD-10-CM | POA: Insufficient documentation

## 2011-11-28 DIAGNOSIS — IMO0001 Reserved for inherently not codable concepts without codable children: Secondary | ICD-10-CM | POA: Insufficient documentation

## 2011-11-28 DIAGNOSIS — M6281 Muscle weakness (generalized): Secondary | ICD-10-CM | POA: Insufficient documentation

## 2011-11-28 DIAGNOSIS — R262 Difficulty in walking, not elsewhere classified: Secondary | ICD-10-CM | POA: Insufficient documentation

## 2011-11-29 ENCOUNTER — Ambulatory Visit (INDEPENDENT_AMBULATORY_CARE_PROVIDER_SITE_OTHER): Payer: Medicare Other | Admitting: Internal Medicine

## 2011-11-29 ENCOUNTER — Encounter: Payer: Self-pay | Admitting: Internal Medicine

## 2011-11-29 VITALS — BP 150/76 | HR 104 | Temp 98.2°F | Resp 18 | Ht 67.0 in | Wt 178.0 lb

## 2011-11-29 DIAGNOSIS — I10 Essential (primary) hypertension: Secondary | ICD-10-CM

## 2011-11-29 DIAGNOSIS — D500 Iron deficiency anemia secondary to blood loss (chronic): Secondary | ICD-10-CM

## 2011-11-29 DIAGNOSIS — M7150 Other bursitis, not elsewhere classified, unspecified site: Secondary | ICD-10-CM

## 2011-11-29 DIAGNOSIS — K219 Gastro-esophageal reflux disease without esophagitis: Secondary | ICD-10-CM

## 2011-11-29 DIAGNOSIS — D649 Anemia, unspecified: Secondary | ICD-10-CM

## 2011-11-29 DIAGNOSIS — M719 Bursopathy, unspecified: Secondary | ICD-10-CM

## 2011-11-29 DIAGNOSIS — IMO0002 Reserved for concepts with insufficient information to code with codable children: Secondary | ICD-10-CM

## 2011-11-29 LAB — CBC WITH DIFFERENTIAL/PLATELET
Basophils Absolute: 0.1 10*3/uL (ref 0.0–0.1)
Eosinophils Absolute: 0.1 10*3/uL (ref 0.0–0.7)
HCT: 32.5 % — ABNORMAL LOW (ref 36.0–46.0)
Hemoglobin: 10.5 g/dL — ABNORMAL LOW (ref 12.0–15.0)
Lymphs Abs: 1.8 10*3/uL (ref 0.7–4.0)
MCHC: 32.4 g/dL (ref 30.0–36.0)
MCV: 90.6 fl (ref 78.0–100.0)
Monocytes Absolute: 0.5 10*3/uL (ref 0.1–1.0)
Neutro Abs: 5.1 10*3/uL (ref 1.4–7.7)
Platelets: 451 10*3/uL — ABNORMAL HIGH (ref 150.0–400.0)
RDW: 14.4 % (ref 11.5–14.6)

## 2011-11-29 LAB — IRON: Iron: 66 ug/dL (ref 42–145)

## 2011-11-29 MED ORDER — POTASSIUM CHLORIDE CRYS ER 20 MEQ PO TBCR: 20.0000 meq | EXTENDED_RELEASE_TABLET | Freq: Every day | ORAL | Status: DC

## 2011-11-29 MED ORDER — TRAMADOL HCL 50 MG PO TABS: 50.0000 mg | ORAL_TABLET | Freq: Four times a day (QID) | ORAL | Status: DC | PRN

## 2011-11-29 NOTE — Patient Instructions (Signed)
The patient is instructed to continue all medications as prescribed. Schedule followup with check out clerk upon leaving the clinic  

## 2011-11-29 NOTE — Progress Notes (Signed)
Subjective:    Patient ID: [name], female    DOB: 02/03/1931, 78 y.o.   MRN: [id number]  HPI Patient is a 78 year old female who is followed for hypertension history of gastroesophageal reflux and osteoarthritis she is status post total knee replacement during the hospitalization she was noted to have a hemoglobin of 7.7.  She was discharged home on iron supplementation but is felt extremely weak and short of breath which is unusual for her.  She is doing well status post her knee replacement but the fatigue is worrisome for postoperative anemia an anemia that is persistent    Review of Systems  Constitutional: Negative for activity change, appetite change and fatigue.  HENT: Negative for ear pain, congestion, neck pain, postnasal drip and sinus pressure.   Eyes: Negative for redness and visual disturbance.  Respiratory: Negative for cough, shortness of breath and wheezing.   Gastrointestinal: Negative for abdominal pain and abdominal distention.  Genitourinary: Negative for dysuria, frequency and menstrual problem.  Musculoskeletal: Negative for myalgias, joint swelling and arthralgias.  Skin: Negative for rash and wound.  Neurological: Negative for dizziness, weakness and headaches.  Hematological: Negative for adenopathy. Does not bruise/bleed easily.  Psychiatric/Behavioral: Negative for sleep disturbance and decreased concentration.   Past Medical History  Diagnosis Date  . PUD (peptic ulcer disease)   . Dyskinesia of esophagus   . Unspecified essential hypertension   . Esophageal reflux   . Anemia   . Anxiety   . Arthritis   . Blood transfusion   . Cataract     surgery 2008  . Stress incontinence     History   Social History  . Marital Status: Single    Spouse Name: N/A    Number of Children: N/A  . Years of Education: N/A   Occupational History  . Glass blower/designer   Social History Main Topics  . Smoking status: Former Smoker    Quit date: 06/24/1990  .  Smokeless tobacco: Never Used   Comment: quit 1985  . Alcohol Use: No  . Drug Use: No  . Sexually Active: Not Currently   Other Topics Concern  . Not on file   Social History Narrative  . No narrative on file    Past Surgical History  Procedure Date  . Abdominal hysterectomy   . Rotator cuff repair   . Oophorectomy   . Cataracts   . Right knee replacement   . Knee arthroplasty 11/01/2011    Procedure: COMPUTER ASSISTED TOTAL KNEE ARTHROPLASTY;  Surgeon: Harvie Junior, MD;  Location: [facility] OR;  Service: Orthopedics;  Laterality: Left;  GENERAL ANESTHESIA WITH PRE OP FEMORAL NERVE BLOCK    Family History  Problem Relation Age of Onset  . Diabetes Mother     Allergies  Allergen Reactions  . Nsaids Other (See Comments)    REACTION: intolerant due to reflux, history of ulcers    Current Outpatient Prescriptions on File Prior to Visit  Medication Sig Dispense Refill  . ALPRAZolam (XANAX) 0.5 MG tablet Take 1 tablet (0.5 mg total) by mouth at bedtime as needed. For anxiety and sleep  30 tablet  5  . B Complex-C-Folic Acid (MULTIVITAMIN, STRESS FORMULA) tablet Take 1 tablet by mouth daily.        . chlorpheniramine-HYDROcodone (TUSSIONEX) 10-8 MG/5ML LQCR Take 5 mLs by mouth every 12 (twelve) hours as needed. For cough       . desoximetasone (TOPICORT) 0.25 % cream Apply 1 application topically 2 (two) times  daily as needed. Apply to rash prn       . DULoxetine (CYMBALTA) 60 MG capsule Take 60 mg by mouth daily.        Marland Kitchen esomeprazole (NEXIUM) 40 MG capsule Take 40 mg by mouth daily before breakfast.      . ferrous sulfate 324 (65 FE) MG TBEC Take by mouth 2 (two) times daily with a meal.       . Fesoterodine Fumarate (TOVIAZ) 8 MG TB24 Take by mouth daily.        . fexofenadine (ALLEGRA) 180 MG tablet Take 180 mg by mouth daily as needed. For allergies       . metoCLOPramide (REGLAN) 5 MG tablet Take 5 mg by mouth 3 (three) times daily with meals as needed.      .  Olmesartan-Amlodipine-HCTZ 40-5-12.5 MG TABS Take 1 tablet by mouth daily.      . polyethylene glycol (MIRALAX) powder Take 17 g by mouth daily.        . potassium chloride SA (K-DUR,KLOR-CON) 20 MEQ tablet Take 20 mEq by mouth daily.      . prednisoLONE acetate (PRED FORTE) 1 % ophthalmic suspension Place 1 drop into both eyes 4 (four) times daily as needed. For eye irritation      . PRESCRIPTION MEDICATION Apply 1 application topically daily. Hypercare 20% external solution under arms daily      . sennosides-docusate sodium (SENOKOT-S) 8.6-50 MG tablet Take 1 tablet by mouth daily.        . sucralfate (CARAFATE) 1 G tablet Take 1 g by mouth 4 (four) times daily as needed. For stomach acid        BP 150/76  Pulse 104  Temp 98.2 F (36.8 C)  Resp 18  Ht 5\' 7"  (1.702 m)  Wt 178 lb (80.74 kg)  BMI 27.88 kg/m2       Objective:   Physical Exam  Nursing note and vitals reviewed. Constitutional:       Pale and pale conjunctiva  HENT:  Head: Normocephalic and atraumatic.  Cardiovascular: Regular rhythm.   Murmur heard.      Mild tachycardia  Abdominal: Soft. Bowel sounds are normal.  Musculoskeletal:       Postoperative changes to the left knee  Skin: Skin is warm and dry.  Psychiatric: She has a normal mood and affect. Her behavior is normal.          Assessment & Plan:  It is 3 weeks postoperative and I suspect she still has persistent anemia based on her symptomatology and physical findings.  We will check a CBC and an iron level today if she's not absorbing the iron we could do an IV iron infusion if she has a adequate iron level but is not producing reticulocytes we will have to transfuse.  Regular samples of several of her medications clear medications for arthritic pain

## 2011-11-30 ENCOUNTER — Other Ambulatory Visit: Payer: Self-pay | Admitting: Internal Medicine

## 2011-12-03 ENCOUNTER — Ambulatory Visit: Payer: Medicare Other

## 2011-12-09 ENCOUNTER — Encounter: Payer: Medicare Other | Admitting: Rehabilitative and Restorative Service Providers"

## 2011-12-10 ENCOUNTER — Ambulatory Visit: Payer: Medicare Other

## 2011-12-10 ENCOUNTER — Encounter: Payer: Medicare Other | Admitting: Rehabilitative and Restorative Service Providers"

## 2011-12-11 ENCOUNTER — Encounter: Payer: Medicare Other | Admitting: Rehabilitative and Restorative Service Providers"

## 2011-12-12 ENCOUNTER — Ambulatory Visit: Payer: Medicare Other

## 2011-12-17 ENCOUNTER — Ambulatory Visit: Payer: Medicare Other

## 2011-12-19 ENCOUNTER — Ambulatory Visit: Payer: Medicare Other

## 2011-12-24 ENCOUNTER — Ambulatory Visit: Payer: Medicare Other | Attending: Internal Medicine

## 2011-12-24 ENCOUNTER — Telehealth: Payer: Self-pay | Admitting: Internal Medicine

## 2011-12-24 DIAGNOSIS — M6281 Muscle weakness (generalized): Secondary | ICD-10-CM | POA: Insufficient documentation

## 2011-12-24 DIAGNOSIS — R262 Difficulty in walking, not elsewhere classified: Secondary | ICD-10-CM | POA: Insufficient documentation

## 2011-12-24 DIAGNOSIS — IMO0001 Reserved for inherently not codable concepts without codable children: Secondary | ICD-10-CM | POA: Insufficient documentation

## 2011-12-24 MED ORDER — FESOTERODINE FUMARATE ER 8 MG PO TB24: 8.0000 mg | ORAL_TABLET | Freq: Every day | ORAL | Status: DC

## 2011-12-24 NOTE — Telephone Encounter (Signed)
Pt says that she normally gets samples for Toviaz 8 mg, but she would like to get script called in to [facility].  Pt req to be notified.

## 2011-12-24 NOTE — Telephone Encounter (Signed)
Please let pt know med has been sent in 

## 2011-12-24 NOTE — Telephone Encounter (Signed)
Called and notified pt that script has been sent in to Pharmacy as noted.

## 2011-12-30 ENCOUNTER — Encounter: Payer: Medicare Other | Admitting: Rehabilitative and Restorative Service Providers"

## 2012-01-01 ENCOUNTER — Ambulatory Visit: Payer: Medicare Other

## 2012-01-01 ENCOUNTER — Encounter: Payer: Medicare Other | Admitting: Rehabilitative and Restorative Service Providers"

## 2012-01-03 ENCOUNTER — Ambulatory Visit: Payer: Medicare Other

## 2012-01-31 ENCOUNTER — Encounter: Payer: Self-pay | Admitting: Internal Medicine

## 2012-01-31 ENCOUNTER — Ambulatory Visit (INDEPENDENT_AMBULATORY_CARE_PROVIDER_SITE_OTHER): Payer: Medicare Other | Admitting: Internal Medicine

## 2012-01-31 VITALS — BP 164/80 | HR 76 | Temp 98.2°F | Resp 16 | Ht 67.0 in | Wt 188.0 lb

## 2012-01-31 DIAGNOSIS — R109 Unspecified abdominal pain: Secondary | ICD-10-CM

## 2012-01-31 DIAGNOSIS — R112 Nausea with vomiting, unspecified: Secondary | ICD-10-CM

## 2012-01-31 DIAGNOSIS — F329 Major depressive disorder, single episode, unspecified: Secondary | ICD-10-CM

## 2012-01-31 LAB — CBC WITH DIFFERENTIAL/PLATELET
Basophils Relative: 0.6 % (ref 0.0–3.0)
Eosinophils Absolute: 0.1 10*3/uL (ref 0.0–0.7)
Hemoglobin: 11.8 g/dL — ABNORMAL LOW (ref 12.0–15.0)
MCHC: 31.7 g/dL (ref 30.0–36.0)
MCV: 89.4 fl (ref 78.0–100.0)
Monocytes Absolute: 0.4 10*3/uL (ref 0.1–1.0)
Neutro Abs: 5.2 10*3/uL (ref 1.4–7.7)
RBC: 4.16 Mil/uL (ref 3.87–5.11)

## 2012-01-31 LAB — HEPATIC FUNCTION PANEL
Alkaline Phosphatase: 95 U/L (ref 39–117)
Bilirubin, Direct: 0 mg/dL (ref 0.0–0.3)
Total Bilirubin: 0.5 mg/dL (ref 0.3–1.2)

## 2012-01-31 MED ORDER — DESVENLAFAXINE SUCCINATE ER 50 MG PO TB24: 50.0000 mg | ORAL_TABLET | Freq: Every day | ORAL | Status: DC

## 2012-01-31 MED ORDER — PROMETHAZINE HCL 25 MG/ML IJ SOLN
25.0000 mg | INTRAMUSCULAR | Status: AC
  Administered 2012-01-31: 25 mg via INTRAMUSCULAR

## 2012-01-31 MED ORDER — PROMETHAZINE HCL 12.5 MG PO TABS: 12.5000 mg | ORAL_TABLET | Freq: Three times a day (TID) | ORAL | Status: DC | PRN

## 2012-01-31 MED ORDER — PROMETHAZINE HCL 25 MG/ML IJ SOLN: 25.0000 mg | Freq: Four times a day (QID) | INTRAMUSCULAR | Status: DC | PRN

## 2012-01-31 NOTE — Patient Instructions (Signed)
Take the phenergan for nausea If you do not hear from me call back Monday for test results

## 2012-02-23 ENCOUNTER — Other Ambulatory Visit: Payer: Self-pay | Admitting: Internal Medicine

## 2012-02-26 NOTE — Progress Notes (Signed)
Subjective:    Patient ID: [name], female    DOB: [birth year], 78 y.o.   MRN: [id number]  HPI [name] is l a 78 year old female is followed for hypertension gastroesophageal reflux chronic cough osteoarthritis and history of iron deficiency anemia second to blood loss she presents today with extreme weakness.  She states that she has had persistent problem with her swallowing or esophageal motility disorder for cough continues to be present but is improved.  Her primary complaint today is extreme fatigue.   Review of Systems  Constitutional: Positive for fatigue and unexpected weight change. Negative for fever, activity change and appetite change.  HENT: Negative for ear pain, congestion, neck pain, postnasal drip and sinus pressure.   Eyes: Negative for redness and visual disturbance.  Respiratory: Positive for cough. Negative for shortness of breath and wheezing.   Gastrointestinal: Negative for abdominal pain and abdominal distention.  Genitourinary: Negative for dysuria, frequency and menstrual problem.  Musculoskeletal: Negative for myalgias, joint swelling and arthralgias.  Skin: Negative for rash and wound.  Neurological: Negative for dizziness, tremors, weakness, light-headedness and headaches.  Hematological: Negative for adenopathy. Does not bruise/bleed easily.  Psychiatric/Behavioral: Negative for disturbed wake/sleep cycle and decreased concentration.   Past Medical History  Diagnosis Date  . PUD (peptic ulcer disease)   . Dyskinesia of esophagus   . Unspecified essential hypertension   . Esophageal reflux   . Anemia   . Anxiety   . Arthritis   . Blood transfusion   . Cataract     surgery 2008  . Stress incontinence     History   Social History  . Marital Status: Single    Spouse Name: N/A    Number of Children: N/A  . Years of Education: N/A   Occupational History  . Glass blower/designer   Social History Main Topics  . Smoking status: Former Smoker   Quit date: 06/24/1990  . Smokeless tobacco: Never Used   Comment: quit 1985  . Alcohol Use: No  . Drug Use: No  . Sexually Active: Not Currently   Other Topics Concern  . Not on file   Social History Narrative  . No narrative on file    Past Surgical History  Procedure Date  . Abdominal hysterectomy   . Rotator cuff repair   . Oophorectomy   . Cataracts   . Right knee replacement   . Knee arthroplasty 11/01/2011    Procedure: COMPUTER ASSISTED TOTAL KNEE ARTHROPLASTY;  Surgeon: Harvie Junior, MD;  Location: [facility] OR;  Service: Orthopedics;  Laterality: Left;  GENERAL ANESTHESIA WITH PRE OP FEMORAL NERVE BLOCK    Family History  Problem Relation Age of Onset  . Diabetes Mother     Allergies  Allergen Reactions  . Nsaids Other (See Comments)    REACTION: intolerant due to reflux, history of ulcers    Current Outpatient Prescriptions on File Prior to Visit  Medication Sig Dispense Refill  . ALPRAZolam (XANAX) 0.5 MG tablet Take 1 tablet (0.5 mg total) by mouth at bedtime as needed. For anxiety and sleep  30 tablet  5  . B Complex-C-Folic Acid (MULTIVITAMIN, STRESS FORMULA) tablet Take 1 tablet by mouth daily.        . chlorpheniramine-HYDROcodone (TUSSIONEX) 10-8 MG/5ML LQCR Take 5 mLs by mouth every 12 (twelve) hours as needed. For cough       . desoximetasone (TOPICORT) 0.25 % cream Apply 1 application topically 2 (two) times daily as needed. Apply to rash prn       .  esomeprazole (NEXIUM) 40 MG capsule Take 40 mg by mouth daily before breakfast.      . ferrous sulfate 324 (65 FE) MG TBEC Take by mouth 2 (two) times daily with a meal.       . fesoterodine (TOVIAZ) 8 MG TB24 Take 1 tablet (8 mg total) by mouth daily.  30 tablet  6  . fexofenadine (ALLEGRA) 180 MG tablet Take 180 mg by mouth daily as needed. For allergies       . metoCLOPramide (REGLAN) 5 MG tablet Take 5 mg by mouth 3 (three) times daily with meals as needed.      . Olmesartan-Amlodipine-HCTZ 40-5-12.5 MG  TABS Take 1 tablet by mouth daily.      . polyethylene glycol (MIRALAX) powder Take 17 g by mouth daily.        . potassium chloride SA (K-DUR,KLOR-CON) 20 MEQ tablet Take 1 tablet (20 mEq total) by mouth daily.  90 tablet  3  . prednisoLONE acetate (PRED FORTE) 1 % ophthalmic suspension Place 1 drop into both eyes 4 (four) times daily as needed. For eye irritation      . PRESCRIPTION MEDICATION Apply 1 application topically daily. Hypercare 20% external solution under arms daily      . sennosides-docusate sodium (SENOKOT-S) 8.6-50 MG tablet Take 1 tablet by mouth daily.        . sucralfate (CARAFATE) 1 G tablet Take 1 g by mouth 4 (four) times daily as needed. For stomach acid      . sucralfate (CARAFATE) 1 G tablet TAKE 1 TABLET (1 GRAM) 4 TIMES A DAY (BETWEEN MEALS AND AT BEDTIME)  120 tablet  0  . desvenlafaxine (PRISTIQ) 50 MG 24 hr tablet Take 1 tablet (50 mg total) by mouth daily.  30 tablet  11  . promethazine (PHENERGAN) 12.5 MG tablet Take 1 tablet (12.5 mg total) by mouth every 6 (six) hours as needed for nausea.  30 tablet  1   No current facility-administered medications on file prior to visit.    BP 164/80  Pulse 76  Temp 98.2 F (36.8 C)  Resp 16  Ht 5\' 7"  (1.702 m)  Wt 188 lb (85.276 kg)  BMI 29.44 kg/m2       Objective:   Physical Exam  Nursing note and vitals reviewed. Constitutional: She is oriented to person, place, and time. She appears well-developed and well-nourished. No distress.  HENT:  Head: Normocephalic and atraumatic.  Right Ear: External ear normal.  Left Ear: External ear normal.  Nose: Nose normal.  Mouth/Throat: Oropharynx is clear and moist.  Eyes: Conjunctivae and EOM are normal. Pupils are equal, round, and reactive to light.  Neck: Normal range of motion. Neck supple. No JVD present. No tracheal deviation present. No thyromegaly present.  Cardiovascular: Normal rate, regular rhythm, normal heart sounds and intact distal pulses.   No murmur  heard. Pulmonary/Chest: Effort normal and breath sounds normal. She has no wheezes. She exhibits no tenderness.  Abdominal: Soft. Bowel sounds are normal.  Musculoskeletal: Normal range of motion. She exhibits no edema and no tenderness.  Lymphadenopathy:    She has no cervical adenopathy.  Neurological: She is alert and oriented to person, place, and time. She has normal reflexes. No cranial nerve deficit.  Skin: Skin is warm and dry. She is not diaphoretic.  Psychiatric: She has a normal mood and affect. Her behavior is normal.          Assessment & Plan:  Adrenal CBC  differential and iron levels today if she has persistent low-lying refer back to gastroenterology for further evaluation of esophageal stricture and possible ulcer disease.  Make sure the patient continues to take a proton pump inhibitor samples of Nexium given to aid in compliance with using Nexium encouraged her to increase it to twice a day for the short-term until we have a result from her blood work today.  Stable blood pressure

## 2012-03-16 ENCOUNTER — Telehealth: Payer: Self-pay | Admitting: Internal Medicine

## 2012-03-16 NOTE — Telephone Encounter (Signed)
Pt states that she has had a recurrent cough for 3 weeks. Pt thinks it could be caused by reflux. PCP gave her Reglan to take TID and pt states this is not helping. Requesting to be seen. Pt scheduled to see Willette Cluster NP tomorrow at [facility]. Pt aware of appt date and time.

## 2012-03-17 ENCOUNTER — Encounter: Payer: Self-pay | Admitting: Nurse Practitioner

## 2012-03-17 ENCOUNTER — Telehealth: Payer: Self-pay | Admitting: *Deleted

## 2012-03-17 ENCOUNTER — Ambulatory Visit (INDEPENDENT_AMBULATORY_CARE_PROVIDER_SITE_OTHER): Payer: Medicare Other | Admitting: Nurse Practitioner

## 2012-03-17 VITALS — BP 162/72 | HR 84 | Ht 65.0 in | Wt 192.0 lb

## 2012-03-17 DIAGNOSIS — Z1211 Encounter for screening for malignant neoplasm of colon: Secondary | ICD-10-CM | POA: Insufficient documentation

## 2012-03-17 DIAGNOSIS — R05 Cough: Secondary | ICD-10-CM

## 2012-03-17 MED ORDER — HYDROCOD POLST-CHLORPHEN POLST 10-8 MG/5ML PO LQCR: 5.0000 mL | Freq: Two times a day (BID) | ORAL | Status: DC

## 2012-03-17 NOTE — Telephone Encounter (Signed)
Cllaed daughter Zella Ball on her cell numer, [phone].  LM for her to call me back.  I did Lm that I did make an appt for this Thurs 9-26th for the [facility] with Dr. Virginia Crews.  I asked her to call me and I can give her the address, details of appointment.

## 2012-03-17 NOTE — Patient Instructions (Addendum)
We have given you a prescription you will have to take to your pharmacy for Tussinex.  We will be calling you with a referral to [facility] in [place], with a date and time.

## 2012-03-17 NOTE — Progress Notes (Signed)
03/17/2012 [name] [id number] October 13, 1930   HISTORY OF PRESENT ILLNESS: Patient is a 78 year old female known to Dr. Juanda Chance for history of esophageal strictures and PUD. She is here to discuss her 30 year history of cough. Her cough is dry , unrelated to eating or anything else. It is not worse any certain time of the day including at night. She rarely has any heartburn and only occasional regurgitation with belching. No sinus drainage. No dysphagia. The cough hasn't gotten any worse or the years. Recently tried on Reglan but it hasn't helped. She has been on a PPI for many, many years. Patient was apparently evaluated for a Nissan fundoplication many years ago. She had a normal manometry study but was unable to tolerate a 24 hour pH probe.   Patient has seen an allergist in the past. It doesn't sound like she has ever seen an ENT. Denies history of asthma, no wheezing   Past Medical History  Diagnosis Date  . PUD (peptic ulcer disease)   . Dyskinesia of esophagus   . Unspecified essential hypertension   . Esophageal reflux   . Anemia   . Anxiety   . Arthritis   . Blood transfusion   . Cataract     surgery 2008  . Stress incontinence    Past Surgical History  Procedure Date  . Abdominal hysterectomy   . Rotator cuff repair   . Oophorectomy   . Cataracts   . Right knee replacement   . Knee arthroplasty 11/01/2011    Procedure: COMPUTER ASSISTED TOTAL KNEE ARTHROPLASTY;  Surgeon: Harvie Junior, MD;  Location: [facility] OR;  Service: Orthopedics;  Laterality: Left;  GENERAL ANESTHESIA WITH PRE OP FEMORAL NERVE BLOCK    reports that she quit smoking about 21 years ago. She has never used smokeless tobacco. She reports that she does not drink alcohol or use illicit drugs. family history includes Diabetes in her mother. Allergies  Allergen Reactions  . Nsaids Other (See Comments)    REACTION: intolerant due to reflux, history of ulcers      Outpatient Encounter Prescriptions as of  03/17/2012  Medication Sig Dispense Refill  . ALPRAZolam (XANAX) 0.5 MG tablet Take 1 tablet (0.5 mg total) by mouth at bedtime as needed. For anxiety and sleep  30 tablet  5  . B Complex-C-Folic Acid (MULTIVITAMIN, STRESS FORMULA) tablet Take 1 tablet by mouth daily.        Marland Kitchen desoximetasone (TOPICORT) 0.25 % cream Apply 1 application topically 2 (two) times daily as needed. Apply to rash prn       . desvenlafaxine (PRISTIQ) 50 MG 24 hr tablet Take 1 tablet (50 mg total) by mouth daily.  30 tablet  11  . esomeprazole (NEXIUM) 40 MG capsule Take 40 mg by mouth daily before breakfast.      . ferrous sulfate 324 (65 FE) MG TBEC Take by mouth 2 (two) times daily with a meal.       . fesoterodine (TOVIAZ) 8 MG TB24 Take 1 tablet (8 mg total) by mouth daily.  30 tablet  6  . fexofenadine (ALLEGRA) 180 MG tablet Take 180 mg by mouth daily as needed. For allergies       . metoCLOPramide (REGLAN) 5 MG tablet Take 5 mg by mouth 3 (three) times daily with meals as needed.      Marland Kitchen NEXIUM 40 MG capsule TAKE 1 CAPSULE BY MOUTH DAILY  30 capsule  4  . Olmesartan-Amlodipine-HCTZ 40-5-12.5 MG TABS  Take 1 tablet by mouth daily.      . polyethylene glycol (MIRALAX) powder Take 17 g by mouth daily.        . potassium chloride SA (K-DUR,KLOR-CON) 20 MEQ tablet Take 1 tablet (20 mEq total) by mouth daily.  90 tablet  3  . prednisoLONE acetate (PRED FORTE) 1 % ophthalmic suspension Place 1 drop into both eyes 4 (four) times daily as needed. For eye irritation      . PRESCRIPTION MEDICATION Apply 1 application topically daily. Hypercare 20% external solution under arms daily      . promethazine (PHENERGAN) 12.5 MG tablet Take 12.5 mg by mouth every 6 (six) hours as needed.      . sennosides-docusate sodium (SENOKOT-S) 8.6-50 MG tablet Take 1 tablet by mouth daily.        . sucralfate (CARAFATE) 1 G tablet Take 1 g by mouth 4 (four) times daily as needed. For stomach acid      . sucralfate (CARAFATE) 1 G tablet TAKE 1  TABLET (1 GRAM) 4 TIMES A DAY (BETWEEN MEALS AND AT BEDTIME)  120 tablet  0  . DISCONTD: promethazine (PHENERGAN) 12.5 MG tablet Take 1 tablet (12.5 mg total) by mouth every 6 (six) hours as needed for nausea.  30 tablet  1  . DISCONTD: chlorpheniramine-HYDROcodone (TUSSIONEX) 10-8 MG/5ML LQCR Take 5 mLs by mouth every 12 (twelve) hours as needed. For cough       . DISCONTD: promethazine (PHENERGAN) 12.5 MG tablet Take 1 tablet (12.5 mg total) by mouth every 8 (eight) hours as needed for nausea.  60 tablet  0   Facility-Administered Encounter Medications as of 03/17/2012  Medication Dose Route Frequency Provider Last Rate Last Dose  . DISCONTD: promethazine (PHENERGAN) injection 25 mg  25 mg Intravenous Q6H PRN Stacie Glaze, MD         REVIEW OF SYSTEMS  : All other systems reviewed and negative except where noted in the History of Present Illness.   PHYSICAL EXAM: BP 162/72  Pulse 84  Ht 5\' 5"  (1.651 m)  Wt 192 lb (87.091 kg)  BMI 31.95 kg/m2 General: Well developed black female in no acute distress Head: Normocephalic and atraumatic Eyes:  sclerae anicteric,conjunctive pink. Ears: Normal auditory acuity Neck: Supple, no masses.  Lungs: Clear throughout to auscultation Heart: Regular rate and rhythm Abdomen: Soft, non distended, nontender. No masses or hepatomegaly noted. Normal Bowel sounds Musculoskeletal: Symmetrical with no gross deformities  Skin: No lesions on visible extremities Extremities: No edema or deformities noted Neurological: Alert oriented x 4, grossly nonfocal Cervical Nodes:  No significant cervical adenopathy Psychological:  Alert and cooperative. Normal mood and affect  ASSESSMENT AND PLAN:   1. Chronic (nonproductive) cough, possibly secondary to GERD though cough refractory to PPIs and prokinetic. If cough is reflux related then she may benefit from Nissen fundoplication. Patient cannot tolerate 24 hour pH probe and EGD can be normal but she still have  reflux. For further evaluation will refer her to [facility] for Voice and Swallowing Disorders. In the meantime will refill Tussionex which does help  2. GERD/history of esophageal strictures. Last upper endoscopy November 2012 with findings of a mild nonobstructing esophageal stricture. Esophagus dilated with a Maloney dilator. Patient has no dysphagia at present.  3. Colon cancer screening. She received a recall colonoscopy letter in March 2013. Patient would like to postpone screening until #1 can be further evaluated

## 2012-03-18 ENCOUNTER — Encounter: Payer: Self-pay | Admitting: Nurse Practitioner

## 2012-03-19 ENCOUNTER — Telehealth: Payer: Self-pay | Admitting: *Deleted

## 2012-03-19 NOTE — Telephone Encounter (Signed)
The patient's daughter Zella Ball didn't get my message in time.  She was having trouble with her phone.   I made another appt with Dr. Delford Field for  04-20-2012  At 10:30 Am.  At [facility],  ( Voice Disorder clinic).  They will be sending a packet with a map to Maricel's residence.  Zella Ball the daughter was happy the appt is on a Monday.  She thanked me for making the appointment.

## 2012-03-23 ENCOUNTER — Ambulatory Visit (INDEPENDENT_AMBULATORY_CARE_PROVIDER_SITE_OTHER): Payer: Medicare Other | Admitting: Internal Medicine

## 2012-03-23 ENCOUNTER — Encounter: Payer: Self-pay | Admitting: Internal Medicine

## 2012-03-23 VITALS — BP 130/74 | HR 84 | Temp 98.6°F | Resp 18 | Ht 65.0 in | Wt 189.0 lb

## 2012-03-23 DIAGNOSIS — K222 Esophageal obstruction: Secondary | ICD-10-CM

## 2012-03-23 DIAGNOSIS — Z23 Encounter for immunization: Secondary | ICD-10-CM

## 2012-03-23 DIAGNOSIS — K5909 Other constipation: Secondary | ICD-10-CM

## 2012-03-23 DIAGNOSIS — R05 Cough: Secondary | ICD-10-CM

## 2012-03-23 MED ORDER — METHYLPREDNISOLONE 4 MG PO TABS: 2.0000 mg | ORAL_TABLET | Freq: Every day | ORAL | Status: DC

## 2012-03-23 MED ORDER — HYDROCODONE-HOMATROPINE 5-1.5 MG/5ML PO SYRP: 5.0000 mL | ORAL_SOLUTION | Freq: Four times a day (QID) | ORAL | Status: DC | PRN

## 2012-03-23 NOTE — Progress Notes (Signed)
  Subjective:    Patient ID: [name], female    DOB: [birth year], 78 y.o.   MRN: [id number]  HPI Patient has persistent cough and hoarseness.  She has been referred to [facility] he here nose and throat all 4 examination of why the cough is persistent.  This referral was made by gastrologist consultant who she saw the Cipro GERD was the primary driving force behind the cough.  Try to stop her cough by changing her blood pressure medicine the past but she gets significant elevations in blood pressure      Review of Systems  Constitutional: Negative for activity change, appetite change and fatigue.  HENT: Positive for congestion and rhinorrhea. Negative for ear pain, neck pain, postnasal drip and sinus pressure.   Eyes: Negative for redness and visual disturbance.  Respiratory: Positive for cough and shortness of breath. Negative for wheezing.   Gastrointestinal: Negative for abdominal pain and abdominal distention.  Genitourinary: Negative for dysuria, frequency and menstrual problem.  Musculoskeletal: Negative for myalgias, joint swelling and arthralgias.  Skin: Negative for rash and wound.  Neurological: Negative for dizziness, weakness and headaches.  Hematological: Negative for adenopathy. Does not bruise/bleed easily.  Psychiatric/Behavioral: Negative for disturbed wake/sleep cycle and decreased concentration.       Objective:   Physical Exam  Nursing note and vitals reviewed. Constitutional: She is oriented to person, place, and time. She appears well-developed.  HENT:  Head: Normocephalic and atraumatic.  Eyes: Conjunctivae normal and EOM are normal.  Neck: Normal range of motion. Neck supple.  Cardiovascular: Normal rate.   Murmur heard. Pulmonary/Chest: She has wheezes.  Abdominal: She exhibits distension. There is tenderness.  Musculoskeletal: She exhibits edema and tenderness.  Neurological: She is alert and oriented to person, place, and time.  Skin: Skin  is warm and dry.          Assessment & Plan:  Patient has a chronic cough that has been difficult to treat we have adjusted her blood pressure medicine but in the past we tried changing to different blood pressure medicine she experienced extreme spikes in her blood pressure she has a hacking persistent cough that is not believed to be fully related to GERD or medications and she has been referred to ENT at [facility] for further evaluation.  I suspect that they'll do a video larygiogram to see about her epiglottic functioning which I believe may be part of the etiology of her chronic cough.

## 2012-03-23 NOTE — Patient Instructions (Signed)
The patient is instructed to continue all medications as prescribed. Schedule followup with check out clerk upon leaving the clinic  

## 2012-03-31 ENCOUNTER — Other Ambulatory Visit: Payer: Self-pay | Admitting: Internal Medicine

## 2012-04-09 ENCOUNTER — Other Ambulatory Visit: Payer: Self-pay | Admitting: Internal Medicine

## 2012-05-07 ENCOUNTER — Other Ambulatory Visit: Payer: Self-pay | Admitting: Internal Medicine

## 2012-05-08 ENCOUNTER — Ambulatory Visit (INDEPENDENT_AMBULATORY_CARE_PROVIDER_SITE_OTHER): Payer: Medicare Other | Admitting: Family Medicine

## 2012-05-08 ENCOUNTER — Encounter: Payer: Self-pay | Admitting: Family Medicine

## 2012-05-08 ENCOUNTER — Telehealth: Payer: Self-pay | Admitting: Internal Medicine

## 2012-05-08 VITALS — BP 124/74 | HR 107 | Temp 98.6°F | Wt 191.0 lb

## 2012-05-08 DIAGNOSIS — J40 Bronchitis, not specified as acute or chronic: Secondary | ICD-10-CM

## 2012-05-08 MED ORDER — AZITHROMYCIN 250 MG PO TABS: ORAL_TABLET | ORAL | Status: DC

## 2012-05-08 NOTE — Telephone Encounter (Signed)
Caller: Shronda/Patient; Patient Name: [name]; PCP: Darryll Capers (Adults only); Best Callback Phone Number: [phone]. She states she has been sick for a week and half- 04/23/12.  She has sore throat , hoarseness,  +coughing productive thick light green.  Afebrile. She has been treating with Cough syrup Hydrocodone. She states medication helps on briefly.  Voice is very hoarse, activly coughing during triage.  Emergent s/sx ruled out per Cough Protocol with the exception to "Productive cough with colored sputum " See provider in 24 hours. Appointment scheduled today 05/08/12 with Dr. Clent Ridges at 2:15 for evaluation. Patient expressed understanding. Home care advice and call back parameters reviewed.

## 2012-05-08 NOTE — Progress Notes (Signed)
  Subjective:    Patient ID: [name], female    DOB: 1930/10/29, 78 y.o.   MRN: [id number]  HPI Here for one week of chest congestion, sweats, and coughing up green sputum. No chest pains.    Review of Systems  Constitutional: Positive for chills. Negative for fever.  HENT: Negative.   Eyes: Negative.   Respiratory: Positive for cough, chest tightness and shortness of breath. Negative for wheezing.   Cardiovascular: Negative.        Objective:   Physical Exam  Constitutional: She appears well-developed and well-nourished.  HENT:  Right Ear: External ear normal.  Left Ear: External ear normal.  Nose: Nose normal.  Mouth/Throat: Oropharynx is clear and moist.  Eyes: Conjunctivae normal are normal.  Pulmonary/Chest: Effort normal and breath sounds normal. No respiratory distress. She has no rales.       Scattered wheezes and rhonchi   Lymphadenopathy:    She has no cervical adenopathy.          Assessment & Plan:  Add Mucinex.

## 2012-05-13 ENCOUNTER — Telehealth: Payer: Self-pay | Admitting: Internal Medicine

## 2012-05-13 NOTE — Telephone Encounter (Signed)
Patient states her stomach is bloated. She would like to be seen to evaluate. Scheduled on 06/09/12 at 9:30 AM with Dr. Juanda Chance.

## 2012-05-14 ENCOUNTER — Encounter: Payer: Self-pay | Admitting: *Deleted

## 2012-05-20 ENCOUNTER — Other Ambulatory Visit: Payer: Self-pay | Admitting: Internal Medicine

## 2012-05-20 ENCOUNTER — Ambulatory Visit: Payer: Medicare Other | Admitting: Internal Medicine

## 2012-05-25 ENCOUNTER — Ambulatory Visit: Payer: Medicare Other | Admitting: Internal Medicine

## 2012-05-29 ENCOUNTER — Encounter: Payer: Self-pay | Admitting: Internal Medicine

## 2012-05-29 ENCOUNTER — Ambulatory Visit (INDEPENDENT_AMBULATORY_CARE_PROVIDER_SITE_OTHER): Payer: Medicare Other | Admitting: Internal Medicine

## 2012-05-29 VITALS — BP 134/78 | HR 80 | Temp 98.1°F | Resp 18 | Ht 65.0 in | Wt 190.0 lb

## 2012-05-29 DIAGNOSIS — R05 Cough: Secondary | ICD-10-CM

## 2012-05-29 DIAGNOSIS — M792 Neuralgia and neuritis, unspecified: Secondary | ICD-10-CM

## 2012-05-29 DIAGNOSIS — IMO0002 Reserved for concepts with insufficient information to code with codable children: Secondary | ICD-10-CM

## 2012-05-29 DIAGNOSIS — F329 Major depressive disorder, single episode, unspecified: Secondary | ICD-10-CM

## 2012-05-29 MED ORDER — DULOXETINE HCL 60 MG PO CPEP: 60.0000 mg | ORAL_CAPSULE | Freq: Every day | ORAL | Status: DC

## 2012-05-29 MED ORDER — METHYLPREDNISOLONE 4 MG PO TABS: 2.0000 mg | ORAL_TABLET | Freq: Every day | ORAL | Status: DC

## 2012-05-29 NOTE — Patient Instructions (Addendum)
We are going to resume the Cymbalta 60 mg daily to help with her pain and you can continue to take a low dose of methylprednisolone  Hiatal Hernia A hiatal hernia occurs when a part of the stomach slides above the diaphragm. The diaphragm is the thin muscle separating the belly (abdomen) from the chest. A hiatal hernia can be something you are born with or develop over time. Hiatal hernias may allow stomach acid to flow back into your esophagus, the tube which carries food from your mouth to your stomach. If this acid causes problems it is called GERD (gastro-esophageal reflux disease).  SYMPTOMS  Common symptoms of GERD are heartburn (burning in your chest). This is worse when lying down or bending over. It may also cause belching and indigestion. Some of the things which make GERD worse are:  Increased weight pushes on stomach making acid rise more easily.  Smoking markedly increases acid production.  Alcohol decreases lower esophageal sphincter pressure (valve between stomach and esophagus), allowing acid from stomach into esophagus.  Late evening meals and going to bed with a full stomach increases pressure.  Anything that causes an increase in acid production.  Lower esophageal sphincter incompetence. DIAGNOSIS  Hiatal hernia is often diagnosed with x-rays of your stomach and small bowel. This is called an UGI (upper gastrointestinal x-ray). Sometimes a gastroscopic procedure is done. This is a procedure where your caregiver uses a flexible instrument to look into the stomach and small bowel. HOME CARE INSTRUCTIONS   Try to achieve and maintain an ideal body weight.  Avoid drinking alcoholic beverages.  Stop smoking.  Put the head of your bed on 4 to 6 inch blocks. This will keep your head and esophagus higher than your stomach. If you cannot use blocks, sleep with several pillows under your head and shoulders.  Over-the-counter medications will decrease acid production. Your  caregiver can also prescribe medications for this. Take as directed.  1/2 to 1 teaspoon of an antacid taken every hour while awake, with meals and at bedtime, will neutralize acid.  Do not take aspirin, ibuprofen (Advil or Motrin), or other nonsteroidal anti-inflammatory drugs.  Do not wear tight clothing around your chest or stomach.  Eat smaller meals and eat more frequently. This keeps your stomach from getting too full. Eat slowly.  Do not lie down for 2 or 3 hours after eating. Do not eat or drink anything 1 to 2 hours before going to bed.  Avoid caffeine beverages (colas, coffee, cocoa, tea), fatty foods, citrus fruits and all other foods and drinks that contain acid and that seem to increase the problems.  Avoid bending over, especially after eating. Also avoid straining during bowel movements or when urinating or lifting things. Anything that increases the pressure in your belly increases the amount of acid that may be pushed up into your esophagus. SEEK IMMEDIATE MEDICAL CARE IF:  There is change in location (pain in arms, neck, jaw, teeth or back) of your pain, or the pain is getting worse.  You also experience nausea, vomiting, sweating (diaphoresis), or shortness of breath.  You develop continual vomiting, vomit blood or coffee ground material, have bright red blood in your stools, or have black tarry stools. Some of these symptoms could signal other problems such as heart disease. MAKE SURE YOU:   Understand these instructions.  Monitor your condition.  Contact your caregiver if you are not doing well or are getting worse. Document Released: 08/31/2003 Document Revised: 09/02/2011 Document Reviewed:  06/10/2005 [employer] Patient Information 2013 [place], [place]. Gastroesophageal Reflux Disease, Adult Gastroesophageal reflux disease (GERD) happens when acid from your stomach flows up into the esophagus. When acid comes in contact with the esophagus, the acid causes  soreness (inflammation) in the esophagus. Over time, GERD may create small holes (ulcers) in the lining of the esophagus. CAUSES   Increased body weight. This puts pressure on the stomach, making acid rise from the stomach into the esophagus.  Smoking. This increases acid production in the stomach.  Drinking alcohol. This causes decreased pressure in the lower esophageal sphincter (valve or ring of muscle between the esophagus and stomach), allowing acid from the stomach into the esophagus.  Late evening meals and a full stomach. This increases pressure and acid production in the stomach.  A malformed lower esophageal sphincter. Sometimes, no cause is found. SYMPTOMS   Burning pain in the lower part of the mid-chest behind the breastbone and in the mid-stomach area. This may occur twice a week or more often.  Trouble swallowing.  Sore throat.  Dry cough.  Asthma-like symptoms including chest tightness, shortness of breath, or wheezing. DIAGNOSIS  Your caregiver may be able to diagnose GERD based on your symptoms. In some cases, X-rays and other tests may be done to check for complications or to check the condition of your stomach and esophagus. TREATMENT  Your caregiver may recommend over-the-counter or prescription medicines to help decrease acid production. Ask your caregiver before starting or adding any new medicines.  HOME CARE INSTRUCTIONS   Change the factors that you can control. Ask your caregiver for guidance concerning weight loss, quitting smoking, and alcohol consumption.  Avoid foods and drinks that make your symptoms worse, such as:  Caffeine or alcoholic drinks.  Chocolate.  Peppermint or mint flavorings.  Garlic and onions.  Spicy foods.  Citrus fruits, such as oranges, lemons, or limes.  Tomato-based foods such as sauce, chili, salsa, and pizza.  Fried and fatty foods.  Avoid lying down for the 3 hours prior to your bedtime or prior to taking a  nap.  Eat small, frequent meals instead of large meals.  Wear loose-fitting clothing. Do not wear anything tight around your waist that causes pressure on your stomach.  Raise the head of your bed 6 to 8 inches with wood blocks to help you sleep. Extra pillows will not help.  Only take over-the-counter or prescription medicines for pain, discomfort, or fever as directed by your caregiver.  Do not take aspirin, ibuprofen, or other nonsteroidal anti-inflammatory drugs (NSAIDs). SEEK IMMEDIATE MEDICAL CARE IF:   You have pain in your arms, neck, jaw, teeth, or back.  Your pain increases or changes in intensity or duration.  You develop nausea, vomiting, or sweating (diaphoresis).  You develop shortness of breath, or you faint.  Your vomit is green, yellow, black, or looks like coffee grounds or blood.  Your stool is red, bloody, or black. These symptoms could be signs of other problems, such as heart disease, gastric bleeding, or esophageal bleeding. MAKE SURE YOU:   Understand these instructions.  Will watch your condition.  Will get help right away if you are not doing well or get worse. Document Released: 03/20/2005 Document Revised: 09/02/2011 Document Reviewed: 12/28/2010 [facility] Patient Information 2013 [place], [place].   Stop the pristiq

## 2012-05-29 NOTE — Progress Notes (Signed)
Subjective:    Patient ID: [name], female    DOB: 1931/03/07, 78 y.o.   MRN: [id number]  HPI Patient's cough has been confirmed to be gastroesophageal reflux with hiatal hernia and reflux esophagitis she is on Nexium.  We discussed the roles of obesity and hiatal hernia that these 2 conditions play in her chronic cough.  We discussed the continued need to take the Nexium eats small meals not eating late at night elevate the head of her bed.  Her blood pressure stable her current medications she is short of breath due to progressive deconditioning    Review of Systems  Constitutional: Positive for fatigue. Negative for activity change and appetite change.  HENT: Negative for ear pain, congestion, neck pain, postnasal drip and sinus pressure.   Eyes: Negative for redness and visual disturbance.  Respiratory: Positive for cough and shortness of breath. Negative for wheezing.   Gastrointestinal: Negative for abdominal pain and abdominal distention.  Genitourinary: Negative for dysuria, frequency and menstrual problem.  Musculoskeletal: Negative for myalgias, joint swelling and arthralgias.  Skin: Negative for rash and wound.  Neurological: Positive for weakness. Negative for dizziness and headaches.  Hematological: Negative for adenopathy. Does not bruise/bleed easily.  Psychiatric/Behavioral: Negative for sleep disturbance and decreased concentration.   Past Medical History  Diagnosis Date  . PUD (peptic ulcer disease)   . Unspecified essential hypertension   . Esophageal reflux   . Anemia   . Anxiety   . Arthritis   . Blood transfusion   . Cataract     surgery 2008  . Stress incontinence   . Esophageal stricture   . H. pylori infection   . Hiatal hernia   . Renal cyst   . Diverticulosis   . Esophageal dysmotility     History   Social History  . Marital Status: Single    Spouse Name: N/A    Number of Children: N/A  . Years of Education: N/A   Occupational  History  . Glass blower/designer   Social History Main Topics  . Smoking status: Former Smoker    Quit date: 06/24/1990  . Smokeless tobacco: Never Used     Comment: quit 1985  . Alcohol Use: No  . Drug Use: No  . Sexually Active: Not Currently   Other Topics Concern  . Not on file   Social History Narrative  . No narrative on file    Past Surgical History  Procedure Date  . Abdominal hysterectomy   . Rotator cuff repair   . Oophorectomy   . Cataract extraction, bilateral   . Right knee replacement   . Knee arthroplasty 11/01/2011    Procedure: COMPUTER ASSISTED TOTAL KNEE ARTHROPLASTY;  Surgeon: Harvie Junior, MD;  Location: [facility] OR;  Service: Orthopedics;  Laterality: Left;  GENERAL ANESTHESIA WITH PRE OP FEMORAL NERVE BLOCK    Family History  Problem Relation Age of Onset  . Diabetes Mother   . Colon cancer Neg Hx     Allergies  Allergen Reactions  . Nsaids Other (See Comments)    REACTION: intolerant due to reflux, history of ulcers    Current Outpatient Prescriptions on File Prior to Visit  Medication Sig Dispense Refill  . ALPRAZolam (XANAX) 0.5 MG tablet TAKE 1 TABLET AT BEDTIME AS NEEDED FOR ANXIETY  30 tablet  5  . azithromycin (ZITHROMAX) 250 MG tablet As directed  6 tablet  0  . B Complex-C-Folic Acid (MULTIVITAMIN, STRESS FORMULA) tablet Take 1 tablet by  mouth daily.        . chlorpheniramine-HYDROcodone (TUSSIONEX PENNKINETIC ER) 10-8 MG/5ML LQCR Take 5 mLs by mouth every 12 (twelve) hours.  140 mL  0  . [employer] ALLERGY RELIEF 180 MG tablet TAKE 1 TABLET (180 MG TOTAL) BY MOUTH DAILY.  30 tablet  6  . desoximetasone (TOPICORT) 0.25 % cream Apply 1 application topically 2 (two) times daily as needed. Apply to rash prn       . esomeprazole (NEXIUM) 40 MG capsule Take 40 mg by mouth daily before breakfast.      . ferrous sulfate 324 (65 FE) MG TBEC Take by mouth 2 (two) times daily with a meal.       . fesoterodine (TOVIAZ) 8 MG TB24 Take 1 tablet (8 mg total) by mouth  daily.  30 tablet  6  . fexofenadine (ALLEGRA) 180 MG tablet Take 180 mg by mouth daily as needed. For allergies       . gabapentin (NEURONTIN) 100 MG capsule Take 100 mg by mouth 3 (three) times daily.      Marland Kitchen HYDROcodone-homatropine (HYCODAN) 5-1.5 MG/5ML syrup Take 5 mLs by mouth every 6 (six) hours as needed for cough.  240 mL  3  . metoCLOPramide (REGLAN) 5 MG tablet Take 5 mg by mouth 3 (three) times daily with meals as needed.      Marland Kitchen NEXIUM 40 MG capsule TAKE 1 CAPSULE BY MOUTH DAILY  30 capsule  4  . Olmesartan-Amlodipine-HCTZ 40-5-12.5 MG TABS Take 1 tablet by mouth daily.      . polyethylene glycol (MIRALAX) powder Take 17 g by mouth daily.        . potassium chloride SA (K-DUR,KLOR-CON) 20 MEQ tablet Take 1 tablet (20 mEq total) by mouth daily.  90 tablet  3  . prednisoLONE acetate (PRED FORTE) 1 % ophthalmic suspension Place 1 drop into both eyes 4 (four) times daily as needed. For eye irritation      . prednisoLONE acetate (PRED FORTE) 1 % ophthalmic suspension PLACE 1 DROP INTO BOTH EYES 4 (FOUR) TIMES DAILY.  10 mL  2  . PRESCRIPTION MEDICATION Apply 1 application topically daily. Hypercare 20% external solution under arms daily      . promethazine (PHENERGAN) 12.5 MG tablet Take 12.5 mg by mouth every 6 (six) hours as needed.      . sennosides-docusate sodium (SENOKOT-S) 8.6-50 MG tablet Take 1 tablet by mouth daily.        . sucralfate (CARAFATE) 1 G tablet Take 1 g by mouth 4 (four) times daily as needed. For stomach acid      . sucralfate (CARAFATE) 1 G tablet TAKE 1 TABLET (1 GRAM) 4 TIMES A DAY (BETWEEN MEALS AND AT BEDTIME)  120 tablet  0  . traMADol (ULTRAM) 50 MG tablet TAKE 1 TABLET (50 MG TOTAL) BY MOUTH EVERY 6 (SIX) HOURS AS NEEDED FOR PAIN.  100 tablet  3    BP 134/78  Pulse 80  Temp 98.1 F (36.7 C)  Resp 18  Ht 5\' 5"  (1.651 m)  Wt 190 lb (86.183 kg)  BMI 31.62 kg/m2       Objective:   Physical Exam  Nursing note reviewed. Constitutional: She is oriented  to person, place, and time. She appears well-developed and well-nourished. No distress.  HENT:  Head: Normocephalic and atraumatic.  Right Ear: External ear normal.  Left Ear: External ear normal.  Nose: Nose normal.  Mouth/Throat: Oropharynx is clear and moist.  Eyes: Conjunctivae normal and EOM are normal. Pupils are equal, round, and reactive to light.  Neck: Normal range of motion. Neck supple. No JVD present. No tracheal deviation present. No thyromegaly present.  Cardiovascular: Normal rate, regular rhythm and intact distal pulses.   Murmur heard. Pulmonary/Chest: Effort normal. She has wheezes. She exhibits no tenderness.  Abdominal: Soft. Bowel sounds are normal.  Musculoskeletal: Normal range of motion. She exhibits no edema and no tenderness.  Lymphadenopathy:    She has no cervical adenopathy.  Neurological: She is alert and oriented to person, place, and time. She has normal reflexes. No cranial nerve deficit.  Skin: Skin is warm and dry. She is not diaphoretic.  Psychiatric: She has a normal mood and affect. Her behavior is normal.          Assessment & Plan:  Progressive deconditioning discussed a gradual exercise program.  Chronic cough related to GERD education given continued use of Nexium recommended.  Stable blood pressure on current medications.  Stable chronic problems followup in 3 months Change pristiq 2 Cymbalta for depression control and also neuropathic pain

## 2012-06-03 ENCOUNTER — Other Ambulatory Visit: Payer: Self-pay | Admitting: Internal Medicine

## 2012-06-09 ENCOUNTER — Ambulatory Visit: Payer: Medicare Other | Admitting: Internal Medicine

## 2012-07-02 ENCOUNTER — Encounter (HOSPITAL_COMMUNITY): Payer: Self-pay | Admitting: *Deleted

## 2012-07-02 ENCOUNTER — Emergency Department (HOSPITAL_COMMUNITY)
Admission: EM | Admit: 2012-07-02 | Discharge: 2012-07-03 | Disposition: A | Discharge status: Home / Self Care | Payer: Medicare Other | Attending: Emergency Medicine | Admitting: Emergency Medicine

## 2012-07-02 DIAGNOSIS — Z8669 Personal history of other diseases of the nervous system and sense organs: Secondary | ICD-10-CM | POA: Insufficient documentation

## 2012-07-02 DIAGNOSIS — Z8739 Personal history of other diseases of the musculoskeletal system and connective tissue: Secondary | ICD-10-CM | POA: Insufficient documentation

## 2012-07-02 DIAGNOSIS — D649 Anemia, unspecified: Secondary | ICD-10-CM | POA: Insufficient documentation

## 2012-07-02 DIAGNOSIS — Z87891 Personal history of nicotine dependence: Secondary | ICD-10-CM | POA: Insufficient documentation

## 2012-07-02 DIAGNOSIS — Z8711 Personal history of peptic ulcer disease: Secondary | ICD-10-CM | POA: Insufficient documentation

## 2012-07-02 DIAGNOSIS — K219 Gastro-esophageal reflux disease without esophagitis: Secondary | ICD-10-CM | POA: Insufficient documentation

## 2012-07-02 DIAGNOSIS — I10 Essential (primary) hypertension: Secondary | ICD-10-CM | POA: Insufficient documentation

## 2012-07-02 DIAGNOSIS — R51 Headache: Secondary | ICD-10-CM | POA: Insufficient documentation

## 2012-07-02 DIAGNOSIS — Z8619 Personal history of other infectious and parasitic diseases: Secondary | ICD-10-CM | POA: Insufficient documentation

## 2012-07-02 DIAGNOSIS — F411 Generalized anxiety disorder: Secondary | ICD-10-CM | POA: Insufficient documentation

## 2012-07-02 DIAGNOSIS — Z79899 Other long term (current) drug therapy: Secondary | ICD-10-CM | POA: Insufficient documentation

## 2012-07-02 DIAGNOSIS — Z8719 Personal history of other diseases of the digestive system: Secondary | ICD-10-CM | POA: Insufficient documentation

## 2012-07-02 NOTE — ED Notes (Signed)
Pt c/o gradual HA since this afternoon.  Checked BP before coming to hospital and it was 190 systolic.  Also ok c/o weakness.

## 2012-07-02 NOTE — ED Provider Notes (Signed)
History     CSN: [id number]  Arrival date & time 07/02/12  2113   First MD Initiated Contact with Patient 07/02/12 2339      Chief Complaint  Patient presents with  . Headache  . Hypertension    (Consider location/radiation/quality/duration/timing/severity/associated sxs/prior treatment) HPI Comments:  79 year old female with a history of hypertension who presents with a complaint of headache and hypertension  #1 the patient developed a headache which was described as bitemporal and a bandlike pressure across the front of her head which lasted for several hours prior to arrival. It has currently resolved spontaneously and she has no associated numbness, weakness, blurred vision, slurred speech or difficulty with balance. When her headache would not go away she took her blood pressure at home and noted it to be 180-190 systolic prompting her visit to the emergency department. Earlier in the day she said that she was eating lunch and felt like her legs were so weak she couldn't stand up, after she had her meal her strength returned and was back to normal.  She has no complaints of weakness at this time. She states that the symptoms lasted maybe 10 minutes.  #2 hypertension the patient has a history of chronic hypertension who has been on multiple medications in the past and currently takes a combination blood pressure medication which has been doing very well. She has been on this medication for 6 months. This evening her blood pressure spiked, it is usually under good control, on arrival the patient's blood pressure has returned to a more normal or reasonable range  The history is provided by the patient and a relative.    Past Medical History  Diagnosis Date  . PUD (peptic ulcer disease)   . Unspecified essential hypertension   . Esophageal reflux   . Anemia   . Anxiety   . Arthritis   . Blood transfusion   . Cataract     surgery 2008  . Stress incontinence   . Esophageal  stricture   . H. pylori infection   . Hiatal hernia   . Renal cyst   . Diverticulosis   . Esophageal dysmotility     Past Surgical History  Procedure Date  . Abdominal hysterectomy   . Rotator cuff repair   . Oophorectomy   . Cataract extraction, bilateral   . Right knee replacement   . Knee arthroplasty 11/01/2011    Procedure: COMPUTER ASSISTED TOTAL KNEE ARTHROPLASTY;  Surgeon: Harvie Junior, MD;  Location: [facility] OR;  Service: Orthopedics;  Laterality: Left;  GENERAL ANESTHESIA WITH PRE OP FEMORAL NERVE BLOCK    Family History  Problem Relation Age of Onset  . Diabetes Mother   . Colon cancer Neg Hx     History  Substance Use Topics  . Smoking status: Former Smoker    Quit date: 06/24/1990  . Smokeless tobacco: Never Used     Comment: quit 1985  . Alcohol Use: No    OB History    Grav Para Term Preterm Abortions TAB SAB Ect Mult Living                  Review of Systems  All other systems reviewed and are negative.    Allergies  Nsaids  Home Medications   Current Outpatient Rx  Name  Route  Sig  Dispense  Refill  . ALPRAZOLAM 0.5 MG PO TABS   Oral   Take 0.5 mg by mouth at bedtime as needed. For sleep         .  DESOXIMETASONE 0.25 % EX CREA   Topical   Apply 1 application topically 2 (two) times daily as needed. Apply to rash prn          . DULOXETINE HCL 60 MG PO CPEP   Oral   Take 1 capsule (60 mg total) by mouth daily.   30 capsule   11   . ESOMEPRAZOLE MAGNESIUM 40 MG PO CPDR   Oral   Take 40 mg by mouth daily before breakfast.         . FERROUS SULFATE 324 (65 FE) MG PO TBEC   Oral   Take by mouth 2 (two) times daily with a meal.          . FESOTERODINE FUMARATE ER 8 MG PO TB24   Oral   Take 1 tablet (8 mg total) by mouth daily.   30 tablet   6   . FEXOFENADINE HCL 180 MG PO TABS   Oral   Take 180 mg by mouth daily as needed. For allergies          . GABAPENTIN 100 MG PO CAPS   Oral   Take 100 mg by mouth 3 (three)  times daily.         Marland Kitchen HYDROCODONE-ACETAMINOPHEN 5-500 MG PO TABS   Oral   Take 1 tablet by mouth 2 (two) times daily as needed. For severe arthritis pain         . ADULT MULTIVITAMIN W/MINERALS CH   Oral   Take 1 tablet by mouth daily.         Marland Kitchen OLMESARTAN-AMLODIPINE-HCTZ 40-5-12.5 MG PO TABS   Oral   Take 1 tablet by mouth daily.         Marland Kitchen POLYETHYLENE GLYCOL 3350 PO POWD   Oral   Take 17 g by mouth daily.           Marland Kitchen POTASSIUM CHLORIDE CRYS ER 20 MEQ PO TBCR   Oral   Take 1 tablet (20 mEq total) by mouth daily.   90 tablet   3   . PREDNISOLONE ACETATE 1 % OP SUSP   Both Eyes   Place 1 drop into both eyes 4 (four) times daily as needed. For eye irritation         . PRESCRIPTION MEDICATION   Topical   Apply 1 application topically daily. Hypercare 20% external solution under arms daily         . SENNA-DOCUSATE SODIUM 8.6-50 MG PO TABS   Oral   Take 1 tablet by mouth daily.           . SUCRALFATE 1 G PO TABS   Oral   Take 1 g by mouth 4 (four) times daily as needed. For stomach acid         . TRAMADOL HCL 50 MG PO TABS   Oral   Take 50 mg by mouth every 6 (six) hours as needed. For mild arthritis pain           BP 121/60  Pulse 76  Temp 98.6 F (37 C) (Oral)  Resp 16  SpO2 100%  Physical Exam  Nursing note and vitals reviewed. Constitutional: She appears well-developed and well-nourished. No distress.  HENT:  Head: Normocephalic and atraumatic.  Mouth/Throat: Oropharynx is clear and moist. No oropharyngeal exudate.  Eyes: Conjunctivae normal and EOM are normal. Pupils are equal, round, and reactive to light. Right eye exhibits no discharge. Left eye exhibits no discharge. No scleral icterus.  Neck: Normal range of motion. Neck supple. No JVD present. No thyromegaly present.  Cardiovascular: Normal rate, regular rhythm, normal heart sounds and intact distal pulses.  Exam reveals no gallop and no friction rub.   No murmur  heard. Pulmonary/Chest: Effort normal and breath sounds normal. No respiratory distress. She has no wheezes. She has no rales.  Abdominal: Soft. Bowel sounds are normal. She exhibits no distension and no mass. There is no tenderness.  Musculoskeletal: Normal range of motion. She exhibits no edema and no tenderness.  Lymphadenopathy:    She has no cervical adenopathy.  Neurological: She is alert. Coordination normal.       The patient has normal speech, normal memory, normal coordination of all 4 extremities without any limb ataxia. The patient has normal strength of all 4 extremities at the major muscle groups including shoulders, biceps, triceps, forearm, grips, quadriceps, hamstrings, calves. Normal sensation to light touch and pinprick of all 4 extremities and the trunk. No truncal ataxia, normal gait, cranial nerves III through XII are intact.  Normal peripheral visual fields. Normal extraocular movements. Normal pupillary exam. No pronator drift, normal reflexes at the brachial radialis, biceps and patellar tendons. Normal position sense, normal temperature sensation to cold and warm  Skin: Skin is warm and dry. No rash noted. No erythema.  Psychiatric: She has a normal mood and affect. Her behavior is normal.    ED Course  Procedures (including critical care time)  Labs Reviewed - No data to display Ct Head Wo Contrast  07/03/2012  *RADIOLOGY REPORT*  Clinical Data:  Severe headache.  Elevated blood pressure. Weakness.  CT HEAD WITHOUT CONTRAST  Technique:  Contiguous axial images were obtained from the base of the skull through the vertex without contrast  Comparison:  None.  Findings:  The brain has a normal appearance without evidence for hemorrhage, acute infarction, hydrocephalus, or mass lesion. There is minimal atrophy for the patient's age of 53.  No significant chronic microvascular ischemic change.  Early carotid atherosclerosis.  Calvarium intact. No acute sinus or mastoid disease.   IMPRESSION:       Unremarkable CT head without contrast.   Original Report Authenticated By: Davonna Belling, M.D.      1. Headache   2. Hypertension       MDM  At this time the patient's blood pressure is  Filed Vitals:   07/02/12 2118 07/02/12 2330 07/03/12 0029  BP: 166/100 150/84 121/60  Pulse: 104 90 76  Temp: 98.6 F (37 C)    TempSrc: Oral    Resp: 18 16 16   SpO2: 100% 100%    She appears much better at this time and has no complaints, no symptoms.  CT scan of the head is negative, blood pressure reasonable, patient did have blood pressure rechecked as outpatient.  Vida Roller, MD 07/03/12 [phone]

## 2012-07-03 ENCOUNTER — Encounter (HOSPITAL_COMMUNITY): Payer: Self-pay | Admitting: Radiology

## 2012-07-03 ENCOUNTER — Emergency Department (HOSPITAL_COMMUNITY): Payer: Medicare Other

## 2012-07-07 ENCOUNTER — Other Ambulatory Visit: Payer: Self-pay | Admitting: Internal Medicine

## 2012-07-19 ENCOUNTER — Other Ambulatory Visit: Payer: Self-pay | Admitting: Internal Medicine

## 2012-08-11 ENCOUNTER — Other Ambulatory Visit: Payer: Self-pay | Admitting: Internal Medicine

## 2012-08-19 ENCOUNTER — Other Ambulatory Visit: Payer: Self-pay | Admitting: Internal Medicine

## 2012-09-07 ENCOUNTER — Ambulatory Visit (INDEPENDENT_AMBULATORY_CARE_PROVIDER_SITE_OTHER): Payer: Medicare Other | Admitting: Internal Medicine

## 2012-09-07 ENCOUNTER — Encounter: Payer: Self-pay | Admitting: Internal Medicine

## 2012-09-07 VITALS — BP 148/90 | HR 84 | Temp 98.1°F | Wt 189.0 lb

## 2012-09-07 DIAGNOSIS — R519 Headache, unspecified: Secondary | ICD-10-CM

## 2012-09-07 DIAGNOSIS — I10 Essential (primary) hypertension: Secondary | ICD-10-CM

## 2012-09-07 DIAGNOSIS — R51 Headache: Secondary | ICD-10-CM

## 2012-09-07 DIAGNOSIS — F4323 Adjustment disorder with mixed anxiety and depressed mood: Secondary | ICD-10-CM

## 2012-09-07 MED ORDER — BISOPROLOL-HYDROCHLOROTHIAZIDE 5-6.25 MG PO TABS: 1.0000 | ORAL_TABLET | Freq: Every day | ORAL | Status: DC

## 2012-09-07 MED ORDER — TRAMADOL HCL 50 MG PO TABS: 50.0000 mg | ORAL_TABLET | Freq: Four times a day (QID) | ORAL | Status: DC | PRN

## 2012-09-07 MED ORDER — OLMESARTAN MEDOXOMIL-HCTZ 40-12.5 MG PO TABS: 1.0000 | ORAL_TABLET | Freq: Every day | ORAL | Status: DC

## 2012-09-07 NOTE — Patient Instructions (Addendum)
The blood pressure will be high with a migraine. Take the pain and repeat the blood pressure after 15 min If it remains high seek help if it lowers continue to check   Stop the tirbenzor and replace with ziac  5/6.25 and benicar 40/12.5

## 2012-09-07 NOTE — Progress Notes (Signed)
Subjective:    Patient ID: [name], female    DOB: 1930-09-17, 79 y.o.   MRN: [id number]  HPI The patient has been monitoring blood pressure with [name] wrist monitor Has been elevated and pulse has been elevated indicating increased anxiety along with increased pain.  Patient's chronic nocturnal cough otherwise patient has been stable [name] medications    Review of Systems  Constitutional: Negative for activity change, appetite change and fatigue.  HENT: Positive for sneezing. Negative for ear pain, congestion, neck pain, postnasal drip and sinus pressure.   Eyes: Negative for redness and visual disturbance.  Respiratory: Positive for cough. Negative for shortness of breath and wheezing.   Gastrointestinal: Negative for abdominal pain and abdominal distention.  Genitourinary: Negative for dysuria, frequency and menstrual problem.  Musculoskeletal: Negative for myalgias, joint swelling and arthralgias.  Skin: Negative for rash and wound.  Neurological: Positive for headaches. Negative for dizziness and weakness.  Hematological: Negative for adenopathy. Does not bruise/bleed easily.  Psychiatric/Behavioral: Negative for sleep disturbance and decreased concentration.   Past Medical History  Diagnosis Date  . PUD (peptic ulcer disease)   . Unspecified essential hypertension   . Esophageal reflux   . Anemia   . Anxiety   . Arthritis   . Blood transfusion   . Cataract     surgery 2008  . Stress incontinence   . Esophageal stricture   . H. pylori infection   . Hiatal hernia   . Renal cyst   . Diverticulosis   . Esophageal dysmotility     History   Social History  . Marital Status: Single    Spouse Name: N/A    Number of Children: N/A  . Years of Education: N/A   Occupational History  . Glass blower/designer   Social History Main Topics  . Smoking status: Former Smoker    Quit date: 06/24/1990  . Smokeless tobacco: Never Used     Comment: quit 1985  . Alcohol Use: No  .  Drug Use: No  . Sexually Active: Not Currently   Other Topics Concern  . Not on file   Social History Narrative  . No narrative on file    Past Surgical History  Procedure Laterality Date  . Abdominal hysterectomy    . Rotator cuff repair    . Oophorectomy    . Cataract extraction, bilateral    . Right knee replacement    . Knee arthroplasty  11/01/2011    Procedure: COMPUTER ASSISTED TOTAL KNEE ARTHROPLASTY;  Surgeon: Harvie Junior, MD;  Location: [facility] OR;  Service: Orthopedics;  Laterality: Left;  GENERAL ANESTHESIA WITH PRE OP FEMORAL NERVE BLOCK    Family History  Problem Relation Age of Onset  . Diabetes Mother   . Colon cancer Neg Hx     Allergies  Allergen Reactions  . Nsaids Other (See Comments)    REACTION: intolerant due to reflux, history of ulcers    Current Outpatient Prescriptions on File Prior to Visit  Medication Sig Dispense Refill  . ALPRAZolam (XANAX) 0.5 MG tablet Take 0.5 mg by mouth at bedtime as needed. For sleep      . desoximetasone (TOPICORT) 0.25 % cream Apply 1 application topically 2 (two) times daily as needed. Apply to rash prn       . DULoxetine (CYMBALTA) 60 MG capsule Take 1 capsule (60 mg total) by mouth daily.  30 capsule  11  . esomeprazole (NEXIUM) 40 MG capsule Take 40 mg by mouth  daily before breakfast.      . ferrous sulfate 324 (65 FE) MG TBEC Take by mouth 2 (two) times daily with a meal.       . fexofenadine (ALLEGRA) 180 MG tablet Take 180 mg by mouth daily as needed. For allergies       . gabapentin (NEURONTIN) 100 MG capsule Take 100 mg by mouth 3 (three) times daily.      Marland Kitchen HYDROcodone-acetaminophen (VICODIN) 5-500 MG per tablet Take 1 tablet by mouth 2 (two) times daily as needed. For severe arthritis pain      . HYPERCARE 20 % external solution APPLY UNDER ARMS ONCE DAILY  60 mL  2  . metoCLOPramide (REGLAN) 5 MG tablet TAKE 1 TABLET (5 MG TOTAL) BY MOUTH 3 (THREE) TIMES DAILY WITH MEALS AS NEEDED (ONE A BEDTIME).  90 tablet   5  . Multiple Vitamin (MULTIVITAMIN WITH MINERALS) TABS Take 1 tablet by mouth daily.      . polyethylene glycol (MIRALAX) powder Take 17 g by mouth daily.        . potassium chloride SA (K-DUR,KLOR-CON) 20 MEQ tablet Take 1 tablet (20 mEq total) by mouth daily.  90 tablet  3  . prednisoLONE acetate (PRED FORTE) 1 % ophthalmic suspension Place 1 drop into both eyes 4 (four) times daily as needed. For eye irritation      . PRESCRIPTION MEDICATION Apply 1 application topically daily. Hypercare 20% external solution under arms daily      . sennosides-docusate sodium (SENOKOT-S) 8.6-50 MG tablet Take 1 tablet by mouth daily.        . sucralfate (CARAFATE) 1 G tablet TAKE 1 TABLET (1 GRAM) 4 TIMES A DAY (BETWEEN MEALS AND AT BEDTIME)  120 tablet  0  . TOVIAZ 8 MG TB24 TAKE 1 TABLET (8 MG TOTAL) BY MOUTH DAILY.  30 tablet  6  . traMADol (ULTRAM) 50 MG tablet Take 50 mg by mouth every 6 (six) hours as needed. For mild arthritis pain      . [DISCONTINUED] fesoterodine (TOVIAZ) 8 MG TB24 Take 1 tablet (8 mg total) by mouth daily.  30 tablet  6   No current facility-administered medications on file prior to visit.    BP 148/90  Pulse 84  Temp(Src) 98.1 F (36.7 C) (Oral)  Wt 189 lb (85.73 kg)  BMI 31.45 kg/m2        Objective:   Physical Exam  Constitutional: [name] is oriented to person, place, and time. [name] appears well-developed and well-nourished. No distress.  HENT:  Head: Normocephalic and atraumatic.  Right Ear: External ear normal.  Left Ear: External ear normal.  Nose: Nose normal.  Mouth/Throat: Oropharynx is clear and moist.  Eyes: Conjunctivae and EOM are normal. Pupils are equal, round, and reactive to light.  Neck: Normal range of motion. Neck supple. No JVD present. No tracheal deviation present. No thyromegaly present.  Cardiovascular: Normal rate and intact distal pulses.   Murmur heard. tachycardia  Pulmonary/Chest: Effort normal and breath sounds normal. [name] has no  wheezes. [name] exhibits no tenderness.  Abdominal: Soft. Bowel sounds are normal.  Musculoskeletal: Normal range of motion. [name] exhibits no edema and no tenderness.  Lymphadenopathy:    [name] has no cervical adenopathy.  Neurological: [name] is alert and oriented to person, place, and time. [name] has normal reflexes. No cranial nerve deficit.  Skin: Skin is warm and dry. [name] is not diaphoretic.  Psychiatric: [name] has a normal mood and affect. [name]  behavior is normal.          Assessment & Plan:  Change to Benicar 40/12.5 and Ziac 5/6.25 for better blood pressure control and control of herrapid heart rate  Which is leading to [name] blood pressures with anxiety.  We checked [name] wrist cuff compared to a cuff on [name] arm and [name] wrist cuff is relatively accurate including accuracy about the pulse when [name] pulse is elevated [name] is instructed to practice relaxation and recheck the blood pressure about 10 minutes.

## 2012-09-17 ENCOUNTER — Telehealth: Payer: Self-pay | Admitting: Internal Medicine

## 2012-09-17 NOTE — Telephone Encounter (Signed)
Advised pt she may need to go to er if symptoms worsen.

## 2012-09-17 NOTE — Telephone Encounter (Signed)
Patient Information:  Caller Name: Gwendalynn  Phone: [phone]  Patient: [name], [name]  Gender: Female  DOB: 17-Jun-1931  Age: 79 Years  PCP: Darryll Capers (Adults only)  Office Follow Up:  Does the office need to follow up with this patient?: Yes  Instructions For The Office: PLS SEE RN NOTE  RN Note:  Pt complains of Headache, dizziness, and elevated BP 161/87.  Pt states HTN meds were changed on 3-17.  Pt was taking Tribenzor 40-5-12.5mg  daily, Pt was switched to Benacar 40-12.5mg  and Ziac 5-6.25mg .  Pt would like to go back to her previous HTN med, Tribenzor. All emergent symptoms ruled out per Elevated BP protocol.  PLEASE DISCUSS W/ DR Lovell Sheehan AND FOLLOW UP W/ PT.  Symptoms  Reason For Call & Symptoms: ER CALL. Elevated Blood Pressure  Reviewed Health History In EMR: N/A  Reviewed Medications In EMR: N/A  Reviewed Allergies In EMR: N/A  Reviewed Surgeries / Procedures: N/A  Date of Onset of Symptoms: 09/17/2012  Guideline(s) Used:  High Blood Pressure  Disposition Per Guideline:   Discuss with PCP and Callback by Nurse Today  Reason For Disposition Reached:   Taking BP medications and feels is having side effects (e.g., impotence, cough, dizziness)  Advice Given:  N/A  Patient Will Follow Care Advice:  YES

## 2012-09-17 NOTE — Telephone Encounter (Signed)
Pls advise.  

## 2012-09-18 ENCOUNTER — Other Ambulatory Visit: Payer: Self-pay | Admitting: *Deleted

## 2012-09-18 DIAGNOSIS — I10 Essential (primary) hypertension: Secondary | ICD-10-CM

## 2012-09-18 MED ORDER — HYDROCHLOROTHIAZIDE 12.5 MG PO CAPS: 12.5000 mg | ORAL_CAPSULE | Freq: Every day | ORAL | Status: DC

## 2012-09-18 MED ORDER — AMLODIPINE-OLMESARTAN 5-40 MG PO TABS: 1.0000 | ORAL_TABLET | Freq: Every day | ORAL | Status: DC

## 2012-09-28 ENCOUNTER — Other Ambulatory Visit: Payer: Self-pay | Admitting: Internal Medicine

## 2012-10-03 ENCOUNTER — Other Ambulatory Visit: Payer: Self-pay | Admitting: Internal Medicine

## 2012-12-23 ENCOUNTER — Ambulatory Visit: Payer: Medicare Other | Admitting: Internal Medicine

## 2012-12-24 ENCOUNTER — Other Ambulatory Visit: Payer: Self-pay | Admitting: Internal Medicine

## 2013-01-14 ENCOUNTER — Encounter: Payer: Self-pay | Admitting: Internal Medicine

## 2013-01-14 ENCOUNTER — Other Ambulatory Visit: Payer: Self-pay | Admitting: Internal Medicine

## 2013-01-14 ENCOUNTER — Ambulatory Visit (INDEPENDENT_AMBULATORY_CARE_PROVIDER_SITE_OTHER): Payer: Medicare Other | Admitting: Internal Medicine

## 2013-01-14 VITALS — BP 140/80 | HR 84 | Temp 98.2°F | Resp 16 | Ht 65.0 in | Wt 182.0 lb

## 2013-01-14 DIAGNOSIS — J45909 Unspecified asthma, uncomplicated: Secondary | ICD-10-CM

## 2013-01-14 DIAGNOSIS — J4520 Mild intermittent asthma, uncomplicated: Secondary | ICD-10-CM

## 2013-01-14 DIAGNOSIS — R51 Headache: Secondary | ICD-10-CM

## 2013-01-14 DIAGNOSIS — R519 Headache, unspecified: Secondary | ICD-10-CM

## 2013-01-14 DIAGNOSIS — I10 Essential (primary) hypertension: Secondary | ICD-10-CM

## 2013-01-14 DIAGNOSIS — K219 Gastro-esophageal reflux disease without esophagitis: Secondary | ICD-10-CM

## 2013-01-14 DIAGNOSIS — K224 Dyskinesia of esophagus: Secondary | ICD-10-CM

## 2013-01-14 MED ORDER — TRAMADOL HCL 50 MG PO TABS: 100.0000 mg | ORAL_TABLET | Freq: Four times a day (QID) | ORAL | Status: DC | PRN

## 2013-01-14 NOTE — Progress Notes (Signed)
  Subjective:    Patient ID: [name], female    DOB: Mar 01, 1931, 79 y.o.   MRN: [id number]  HPI Patient is a 79 year old female whose fault for hypertension for gastroesophageal reflux history of esophageal stricture and chronic cough.  She's been doing well off of the prednisone and her chronic cough is stable her blood pressure is stable her GERD is mild flare with some symptoms of stricture recurrence but she altered her diet and has done well.   Review of Systems  Constitutional: Negative for activity change, appetite change and fatigue.  HENT: Negative for ear pain, congestion, neck pain, postnasal drip and sinus pressure.   Eyes: Negative for redness and visual disturbance.  Respiratory: Positive for shortness of breath. Negative for cough and wheezing.   Gastrointestinal: Negative for abdominal pain and abdominal distention.       Mild dysplasia  Genitourinary: Negative for dysuria, frequency and menstrual problem.  Musculoskeletal: Negative for myalgias, joint swelling and arthralgias.  Skin: Negative for rash and wound.  Neurological: Negative for dizziness, weakness and headaches.  Hematological: Negative for adenopathy. Does not bruise/bleed easily.  Psychiatric/Behavioral: Negative for sleep disturbance and decreased concentration.       Objective:   Physical Exam  Constitutional: She is oriented to person, place, and time. She appears well-developed and well-nourished. No distress.  HENT:  Head: Normocephalic and atraumatic.  Mouth/Throat: Oropharynx is clear and moist.  Eyes: Conjunctivae and EOM are normal. Pupils are equal, round, and reactive to light.  Neck: Normal range of motion. Neck supple. No JVD present. No tracheal deviation present. No thyromegaly present.  Cardiovascular: Normal rate.   Murmur heard. Pulmonary/Chest: Effort normal and breath sounds normal. She has no wheezes. She exhibits no tenderness.  Abdominal: Soft. Bowel sounds are normal.   Musculoskeletal: She exhibits no edema and no tenderness.  Lymphadenopathy:    She has no cervical adenopathy.  Neurological: She is alert and oriented to person, place, and time. She has normal reflexes. No cranial nerve deficit.  Skin: Skin is warm and dry. She is not diaphoretic.  Psychiatric: She has a normal mood and affect. Her behavior is normal.          Assessment & Plan:  Stable GERD on Nexium  Stable blood pressure on Azor Will discontinue the methylprednisolone at this point.  Continue toviaz and samples given  OA pain  Continue the mucinex DM

## 2013-03-07 ENCOUNTER — Emergency Department (HOSPITAL_COMMUNITY): Payer: Medicare Other

## 2013-03-07 ENCOUNTER — Encounter (HOSPITAL_COMMUNITY): Payer: Self-pay | Admitting: Emergency Medicine

## 2013-03-07 ENCOUNTER — Observation Stay (HOSPITAL_COMMUNITY)
Admission: EM | Admit: 2013-03-07 | Discharge: 2013-03-09 | Disposition: A | Discharge status: Home / Self Care | Payer: Medicare Other | Attending: Internal Medicine | Admitting: Internal Medicine

## 2013-03-07 DIAGNOSIS — I6523 Occlusion and stenosis of bilateral carotid arteries: Secondary | ICD-10-CM

## 2013-03-07 DIAGNOSIS — Z1211 Encounter for screening for malignant neoplasm of colon: Secondary | ICD-10-CM

## 2013-03-07 DIAGNOSIS — R05 Cough: Secondary | ICD-10-CM

## 2013-03-07 DIAGNOSIS — K5730 Diverticulosis of large intestine without perforation or abscess without bleeding: Secondary | ICD-10-CM

## 2013-03-07 DIAGNOSIS — R7309 Other abnormal glucose: Secondary | ICD-10-CM

## 2013-03-07 DIAGNOSIS — J45909 Unspecified asthma, uncomplicated: Secondary | ICD-10-CM

## 2013-03-07 DIAGNOSIS — E785 Hyperlipidemia, unspecified: Secondary | ICD-10-CM

## 2013-03-07 DIAGNOSIS — H538 Other visual disturbances: Secondary | ICD-10-CM | POA: Diagnosis not present

## 2013-03-07 DIAGNOSIS — R42 Dizziness and giddiness: Secondary | ICD-10-CM

## 2013-03-07 DIAGNOSIS — R351 Nocturia: Secondary | ICD-10-CM

## 2013-03-07 DIAGNOSIS — K222 Esophageal obstruction: Secondary | ICD-10-CM

## 2013-03-07 DIAGNOSIS — G459 Transient cerebral ischemic attack, unspecified: Principal | ICD-10-CM | POA: Diagnosis present

## 2013-03-07 DIAGNOSIS — K5909 Other constipation: Secondary | ICD-10-CM

## 2013-03-07 DIAGNOSIS — D500 Iron deficiency anemia secondary to blood loss (chronic): Secondary | ICD-10-CM

## 2013-03-07 DIAGNOSIS — G4700 Insomnia, unspecified: Secondary | ICD-10-CM

## 2013-03-07 DIAGNOSIS — I10 Essential (primary) hypertension: Secondary | ICD-10-CM | POA: Diagnosis present

## 2013-03-07 DIAGNOSIS — R51 Headache: Secondary | ICD-10-CM | POA: Insufficient documentation

## 2013-03-07 DIAGNOSIS — R110 Nausea: Secondary | ICD-10-CM

## 2013-03-07 DIAGNOSIS — M1990 Unspecified osteoarthritis, unspecified site: Secondary | ICD-10-CM

## 2013-03-07 DIAGNOSIS — K224 Dyskinesia of esophagus: Secondary | ICD-10-CM

## 2013-03-07 DIAGNOSIS — Z79899 Other long term (current) drug therapy: Secondary | ICD-10-CM | POA: Diagnosis not present

## 2013-03-07 DIAGNOSIS — K279 Peptic ulcer, site unspecified, unspecified as acute or chronic, without hemorrhage or perforation: Secondary | ICD-10-CM

## 2013-03-07 DIAGNOSIS — D649 Anemia, unspecified: Secondary | ICD-10-CM

## 2013-03-07 DIAGNOSIS — M545 Low back pain: Secondary | ICD-10-CM

## 2013-03-07 DIAGNOSIS — E876 Hypokalemia: Secondary | ICD-10-CM

## 2013-03-07 DIAGNOSIS — M542 Cervicalgia: Secondary | ICD-10-CM

## 2013-03-07 DIAGNOSIS — Z8711 Personal history of peptic ulcer disease: Secondary | ICD-10-CM

## 2013-03-07 DIAGNOSIS — N281 Cyst of kidney, acquired: Secondary | ICD-10-CM

## 2013-03-07 DIAGNOSIS — R262 Difficulty in walking, not elsewhere classified: Secondary | ICD-10-CM | POA: Diagnosis not present

## 2013-03-07 DIAGNOSIS — R631 Polydipsia: Secondary | ICD-10-CM

## 2013-03-07 DIAGNOSIS — K449 Diaphragmatic hernia without obstruction or gangrene: Secondary | ICD-10-CM

## 2013-03-07 DIAGNOSIS — K219 Gastro-esophageal reflux disease without esophagitis: Secondary | ICD-10-CM

## 2013-03-07 DIAGNOSIS — M19019 Primary osteoarthritis, unspecified shoulder: Secondary | ICD-10-CM

## 2013-03-07 DIAGNOSIS — R1013 Epigastric pain: Secondary | ICD-10-CM

## 2013-03-07 DIAGNOSIS — M1712 Unilateral primary osteoarthritis, left knee: Secondary | ICD-10-CM

## 2013-03-07 LAB — BASIC METABOLIC PANEL
BUN: 11 mg/dL (ref 6–23)
CO2: 23 mEq/L (ref 19–32)
Chloride: 99 mEq/L (ref 96–112)
Creatinine, Ser: 0.93 mg/dL (ref 0.50–1.10)
GFR calc Af Amer: 66 mL/min — ABNORMAL LOW (ref >90)
Glucose, Bld: 107 mg/dL — ABNORMAL HIGH (ref 70–99)

## 2013-03-07 LAB — CBC
HCT: 35.6 % — ABNORMAL LOW (ref 36.0–46.0)
Hemoglobin: 12 g/dL (ref 12.0–15.0)
MCV: 83.8 fL (ref 78.0–100.0)
RDW: 14 % (ref 11.5–15.5)
WBC: 7.3 10*3/uL (ref 4.0–10.5)

## 2013-03-07 MED ORDER — AMLODIPINE BESYLATE 5 MG PO TABS
5.0000 mg | ORAL_TABLET | Freq: Every day | ORAL | Status: DC
  Administered 2013-03-08 – 2013-03-09 (×2): 5 mg via ORAL
  Filled 2013-03-07 (×2): qty 1

## 2013-03-07 MED ORDER — GABAPENTIN 100 MG PO CAPS
100.0000 mg | ORAL_CAPSULE | Freq: Three times a day (TID) | ORAL | Status: DC
  Administered 2013-03-08 – 2013-03-09 (×4): 100 mg via ORAL
  Filled 2013-03-07 (×7): qty 1

## 2013-03-07 MED ORDER — SODIUM CHLORIDE 0.9 % IV SOLN
INTRAVENOUS | Status: AC
  Administered 2013-03-07: via INTRAVENOUS

## 2013-03-07 MED ORDER — ALUMINUM CHLORIDE 20 % EX SOLN: 1.0000 "application " | Freq: Every day | CUTANEOUS | Status: DC

## 2013-03-07 MED ORDER — SENNA 8.6 MG PO TABS
1.0000 | ORAL_TABLET | Freq: Two times a day (BID) | ORAL | Status: DC
  Administered 2013-03-07 – 2013-03-09 (×4): 8.6 mg via ORAL
  Filled 2013-03-07 (×5): qty 1

## 2013-03-07 MED ORDER — IRBESARTAN 300 MG PO TABS
300.0000 mg | ORAL_TABLET | Freq: Every day | ORAL | Status: DC
  Administered 2013-03-08 – 2013-03-09 (×2): 300 mg via ORAL
  Filled 2013-03-07 (×3): qty 1

## 2013-03-07 MED ORDER — FERROUS SULFATE 325 (65 FE) MG PO TABS
325.0000 mg | ORAL_TABLET | Freq: Two times a day (BID) | ORAL | Status: DC
  Administered 2013-03-08 – 2013-03-09 (×3): 325 mg via ORAL
  Filled 2013-03-07 (×5): qty 1

## 2013-03-07 MED ORDER — HYDROCODONE-ACETAMINOPHEN 5-325 MG PO TABS
1.0000 | ORAL_TABLET | Freq: Two times a day (BID) | ORAL | Status: DC | PRN
  Administered 2013-03-07 – 2013-03-08 (×2): 1 via ORAL
  Filled 2013-03-07 (×2): qty 1

## 2013-03-07 MED ORDER — FESOTERODINE FUMARATE ER 8 MG PO TB24
8.0000 mg | ORAL_TABLET | Freq: Every evening | ORAL | Status: DC
  Administered 2013-03-08: 8 mg via ORAL
  Filled 2013-03-07 (×3): qty 1

## 2013-03-07 MED ORDER — ACETAMINOPHEN 325 MG PO TABS: 650.0000 mg | ORAL_TABLET | ORAL | Status: DC | PRN

## 2013-03-07 MED ORDER — TRAMADOL HCL 50 MG PO TABS
100.0000 mg | ORAL_TABLET | Freq: Four times a day (QID) | ORAL | Status: DC | PRN
  Administered 2013-03-08 – 2013-03-09 (×3): 100 mg via ORAL
  Filled 2013-03-07 (×3): qty 2

## 2013-03-07 MED ORDER — ASPIRIN 325 MG PO TABS
325.0000 mg | ORAL_TABLET | Freq: Every day | ORAL | Status: DC
  Filled 2013-03-07 (×2): qty 1

## 2013-03-07 MED ORDER — ENOXAPARIN SODIUM 40 MG/0.4ML ~~LOC~~ SOLN
40.0000 mg | SUBCUTANEOUS | Status: DC
  Administered 2013-03-07 – 2013-03-08 (×2): 40 mg via SUBCUTANEOUS
  Filled 2013-03-07 (×3): qty 0.4

## 2013-03-07 MED ORDER — SENNOSIDES 8.6 MG PO TABS: 2.0000 | ORAL_TABLET | Freq: Two times a day (BID) | ORAL | Status: DC

## 2013-03-07 MED ORDER — AMLODIPINE-OLMESARTAN 5-40 MG PO TABS: 1.0000 | ORAL_TABLET | Freq: Every day | ORAL | Status: DC

## 2013-03-07 MED ORDER — PANTOPRAZOLE SODIUM 40 MG PO TBEC
40.0000 mg | DELAYED_RELEASE_TABLET | Freq: Every day | ORAL | Status: DC
  Administered 2013-03-08: 40 mg via ORAL
  Filled 2013-03-07: qty 1

## 2013-03-07 MED ORDER — ALPRAZOLAM 0.25 MG PO TABS
0.2500 mg | ORAL_TABLET | Freq: Every evening | ORAL | Status: DC | PRN
  Administered 2013-03-08: 0.25 mg via ORAL
  Filled 2013-03-07: qty 1

## 2013-03-07 NOTE — ED Notes (Signed)
Patient asked for urine sample, stated that she had just gone.

## 2013-03-07 NOTE — ED Provider Notes (Signed)
CSN: [id number]     Arrival date & time 03/07/13  1622 History   First MD Initiated Contact with Patient 03/07/13 1623     No chief complaint on file.  (Consider location/radiation/quality/duration/timing/severity/associated sxs/prior Treatment) The history is provided by the patient, medical records and a relative. No language interpreter was used.    [name] is a 79 year old female with a past medical history of hypertension who presents emergency Department with chief complaint of headache and hypertension.  Patient states that she has a history of chronic headaches but can tell the difference date her "normal headache" and her "high blood pressure headache."  Patient states that last night she had a severe throbbing occipital headache, gait disturbance, and vertiginous symptoms.  The patient states that she felt very bad all day before this occurred.  She took some white vinegar laid down and her symptoms resolved in less than an hour.  This morning the patient again had a headache.  She took her blood pressure at home and states that it was elevated but she cannot remember the number.  She called her daughter to come home.  She states she took it again and felt that the bottom number was 104 and became panicked.  The patient cries when speaking about this incident.  Patient has had decreased appetite over the past few days.  She states she had 2 slices of appetite yesterday. Patient denies history of stroke or TIA.  She does have a very strong family history stating that everyone in her family has died from stroke or heart attack.  The patient has not smoked for the past 30 years.  She does not have any diabetes.  She has a history of hypercholesterolemia. Denies fevers, chills, myalgias, arthralgias. Denies DOE, SOB, chest tightness or pressure, radiation to left arm, jaw or back, or diaphoresis. Denies dysuria, flank pain, suprapubic pain, frequency, urgency, or hematuria. Denies abdominal  pain, nausea, vomiting, diarrhea or constipation.    Past Medical History  Diagnosis Date  . PUD (peptic ulcer disease)   . Unspecified essential hypertension   . Esophageal reflux   . Anemia   . Anxiety   . Arthritis   . Blood transfusion   . Cataract     surgery 2008  . Stress incontinence   . Esophageal stricture   . H. pylori infection   . Hiatal hernia   . Renal cyst   . Diverticulosis   . Esophageal dysmotility    Past Surgical History  Procedure Laterality Date  . Abdominal hysterectomy    . Rotator cuff repair    . Oophorectomy    . Cataract extraction, bilateral    . Right knee replacement    . Knee arthroplasty  11/01/2011    Procedure: COMPUTER ASSISTED TOTAL KNEE ARTHROPLASTY;  Surgeon: Harvie Junior, MD;  Location: [facility] OR;  Service: Orthopedics;  Laterality: Left;  GENERAL ANESTHESIA WITH PRE OP FEMORAL NERVE BLOCK   Family History  Problem Relation Age of Onset  . Diabetes Mother   . Colon cancer Neg Hx    History  Substance Use Topics  . Smoking status: Former Smoker    Quit date: 06/24/1990  . Smokeless tobacco: Never Used     Comment: quit 1985  . Alcohol Use: No   OB History   Grav Para Term Preterm Abortions TAB SAB Ect Mult Living                 Review of Systems  Constitutional: Positive for appetite change. Negative for fever and chills.  HENT: Positive for neck pain. Negative for trouble swallowing and neck stiffness.   Eyes: Positive for visual disturbance.  Respiratory: Negative for shortness of breath.   Cardiovascular: Negative for chest pain.  Gastrointestinal: Negative for nausea, vomiting, abdominal pain, diarrhea and constipation.  Genitourinary: Negative for dysuria and hematuria.  Musculoskeletal: Positive for gait problem. Negative for myalgias and arthralgias.  Skin: Negative for rash.  Neurological: Positive for dizziness, speech difficulty and headaches. Negative for numbness.  All other systems reviewed and are  negative.    Allergies  Nsaids  Home Medications   Current Outpatient Rx  Name  Route  Sig  Dispense  Refill  . ALPRAZolam (XANAX) 0.5 MG tablet      TAKE 1 TABLET AT BEDTIME AS NEEDED FOR ANXIETY   30 tablet   5   . amLODipine-olmesartan (AZOR) 5-40 MG per tablet   Oral   Take 1 tablet by mouth daily.   30 tablet   6   . desoximetasone (TOPICORT) 0.25 % cream   Topical   Apply 1 application topically 2 (two) times daily as needed. Apply to rash prn          . DULoxetine (CYMBALTA) 60 MG capsule   Oral   Take 1 capsule (60 mg total) by mouth daily.   30 capsule   11   . esomeprazole (NEXIUM) 40 MG capsule   Oral   Take 40 mg by mouth daily before breakfast.         . ferrous sulfate 324 (65 FE) MG TBEC   Oral   Take by mouth 2 (two) times daily with a meal.          . fexofenadine (ALLEGRA) 180 MG tablet   Oral   Take 180 mg by mouth daily as needed. For allergies          . gabapentin (NEURONTIN) 100 MG capsule   Oral   Take 100 mg by mouth 3 (three) times daily.         . hydrochlorothiazide (MICROZIDE) 12.5 MG capsule      TAKE 1 CAPSULE (12.5 MG TOTAL) BY MOUTH DAILY.   90 capsule   3   . HYDROcodone-acetaminophen (VICODIN) 5-500 MG per tablet   Oral   Take 1 tablet by mouth 2 (two) times daily as needed. For severe arthritis pain         . HYPERCARE 20 % external solution      APPLY UNDER ARMS ONCE DAILY   60 mL   2   . metoCLOPramide (REGLAN) 5 MG tablet      TAKE 1 TABLET (5 MG TOTAL) BY MOUTH 3 (THREE) TIMES DAILY WITH MEALS AS NEEDED (ONE A BEDTIME).   90 tablet   5   . Multiple Vitamin (MULTIVITAMIN WITH MINERALS) TABS   Oral   Take 1 tablet by mouth daily.         Marland Kitchen NEXIUM 40 MG capsule      TAKE 1 CAPSULE BY MOUTH DAILY   30 capsule   4   . polyethylene glycol (MIRALAX) powder   Oral   Take 17 g by mouth daily.           . potassium chloride SA (K-DUR,KLOR-CON) 20 MEQ tablet      1 tablet by mouth  daily   90 tablet   3   . PRESCRIPTION MEDICATION   Topical  Apply 1 application topically daily. Hypercare 20% external solution under arms daily         . sennosides-docusate sodium (SENOKOT-S) 8.6-50 MG tablet   Oral   Take 1 tablet by mouth daily.           . sucralfate (CARAFATE) 1 G tablet      TAKE 1 TABLET (1 GRAM) 4 TIMES A DAY (BETWEEN MEALS AND AT BEDTIME)   120 tablet   0   . TOVIAZ 8 MG TB24      TAKE 1 TABLET (8 MG TOTAL) BY MOUTH DAILY.   30 tablet   6   . traMADol (ULTRAM) 50 MG tablet   Oral   Take 2 tablets (100 mg total) by mouth every 6 (six) hours as needed (1-2 tablets as needed for pain). For mild arthritis pain   120 tablet   4    There were no vitals taken for this visit. Physical Exam  Constitutional: She is oriented to person, place, and time. She appears well-developed and well-nourished. No distress.  HENT:  Head: Normocephalic and atraumatic.  Eyes: Conjunctivae are normal. No scleral icterus.  Neck: Normal range of motion.  No carotid bruit  Cardiovascular: Normal rate, regular rhythm and normal heart sounds.  Exam reveals no gallop and no friction rub.   No murmur heard. Pulmonary/Chest: Effort normal and breath sounds normal. No respiratory distress.  Abdominal: Soft. Bowel sounds are normal. She exhibits no distension and no mass. There is no tenderness. There is no guarding.  Neurological: She is alert and oriented to person, place, and time. No cranial nerve deficit.  With normal and equal strength bilaterally.  She does appear to have some mild cerbellar dysfunction on the left with F-N and RAM.   Skin: Skin is warm and dry. She is not diaphoretic.    ED Course  Procedures (including critical care time) Labs Review Labs Reviewed - No data to display Imaging Review No results found.   Date: 03/07/2013  Rate: 87  Rhythm: normal sinus rhythm  QRS Axis: normal  Intervals: normal  ST/T Wave abnormalities: nonspecific T  wave changes  Conduction Disutrbances:none  Narrative Interpretation:   Old EKG Reviewed: unchanged   MDM  No diagnosis found. 5:27 PM BP 125/71  Temp(Src) 99 F (37.2 C) (Oral)  Resp 14  Ht 5\' 5"  (1.651 m)  Wt 182 lb (82.555 kg)  BMI 30.29 kg/m2  SpO2 96%  Patient normotensive currently. She has an ABCD2 score of 5 (2-Day Stroke Risk: 4.1%, 7-Day Stroke Risk: 5.9%, 90-Day Stroke Risk: 9.8%) Initiating stroke work up, however I believe the patient will need admissionfor TIA/Stroke work up.  7:47 PM Patient labs show mild hypokalemia, cbc is unremarkable. Troponin pending. Cxr without acute abnormality, shows atherosclersosis. Her Ct head is negative.  Patient will be admitted for TIA work up. I have given report to PA schinlever who will cal consult for admission and assume care of the patient.  Arthor Captain, PA-C 03/09/13 2225

## 2013-03-07 NOTE — ED Notes (Signed)
Per EMS pt came from home where she lives alone, started having difficulty speaking and felt uncoordinated this AM as well as severe HA to posterior head. No definite LSN. Denies pain now. Earlier symptoms have resolved. Some left sided weakness noted, possibly from severe arthritis. Denies pain at this time. Pt had a fall 2 weeks ago where she hit the anterior part of her head.

## 2013-03-07 NOTE — H&P (Addendum)
[facility] History and Physical  [name] JYN:[id number] DOB: 07-15-1930 DOA: 03/07/2013  Referring physician: ER physician. PCP: Carrie Mew, MD   Chief Complaint: Blurred vision and difficulty walking with headache.  HPI: [name] is a 79 y.o. female history of hypertension and anxiety started experiencing blurred vision and difficulty ambulating with headache around 1 PM today. Symptoms continued for around 2 hours and EMS was called and patient was brought to the ER. In the ER patient was found to be nonfocal. CT head is negative for anything acute. Patient has been admitted for further management for possible TIA. Patient states that she had a similar episode yesterday. And over the past 2-3 days patient has been having frontal and occipital headaches. Patient was concerned about her blood pressure and when checked at her house she thought it was more than 190 systolic which made her further concern about her symptoms. Patient denies any chest pain palpitation shortness of breath. Denies any weakness of the upper or lower extremities. Denies any incontinence of urine or bowel. The blurred vision was for both eyes. Patient did not have any difficulty speaking or swallowing. Presently on my exam patient is nonfocal.  Review of Systems: As presented in the history of presenting illness, rest negative.  Past Medical History  Diagnosis Date  . PUD (peptic ulcer disease)   . Unspecified essential hypertension   . Esophageal reflux   . Anemia   . Anxiety   . Arthritis   . Blood transfusion   . Cataract     surgery 2008  . Stress incontinence   . Esophageal stricture   . H. pylori infection   . Hiatal hernia   . Renal cyst   . Diverticulosis   . Esophageal dysmotility    Past Surgical History  Procedure Laterality Date  . Abdominal hysterectomy    . Rotator cuff repair    . Oophorectomy    . Cataract extraction, bilateral    . Right knee replacement    .  Knee arthroplasty  11/01/2011    Procedure: COMPUTER ASSISTED TOTAL KNEE ARTHROPLASTY;  Surgeon: Harvie Junior, MD;  Location: [facility] OR;  Service: Orthopedics;  Laterality: Left;  GENERAL ANESTHESIA WITH PRE OP FEMORAL NERVE BLOCK   Social History:  reports that she quit smoking about 22 years ago. She has never used smokeless tobacco. She reports that she does not drink alcohol or use illicit drugs. Home. where does patient live-- Can do ADLs. Can patient participate in ADLs?  Allergies  Allergen Reactions  . Nsaids Other (See Comments)    REACTION: intolerant due to reflux, history of ulcers    Family History  Problem Relation Age of Onset  . Diabetes Mother   . Colon cancer Neg Hx       Prior to Admission medications   Medication Sig Start Date End Date Taking? Authorizing Provider  ALPRAZolam Prudy Feeler) 0.5 MG tablet Take 0.25-0.5 mg by mouth at bedtime as needed for sleep. For sleep   Yes Historical Provider, MD  aluminum chloride (HYPERCARE) 20 % external solution Apply 1 application topically daily. Apply under arms   Yes Historical Provider, MD  amLODipine-olmesartan (AZOR) 5-40 MG per tablet Take 1 tablet by mouth daily. 09/18/12  Yes Stacie Glaze, MD  esomeprazole (NEXIUM) 40 MG capsule Take 40 mg by mouth daily before breakfast.   Yes Historical Provider, MD  ferrous sulfate 325 (65 FE) MG tablet Take 325 mg by mouth 2 (two) times  daily.   Yes Historical Provider, MD  fesoterodine (TOVIAZ) 8 MG TB24 tablet Take 8 mg by mouth every evening.   Yes Historical Provider, MD  gabapentin (NEURONTIN) 100 MG capsule Take 100 mg by mouth 3 (three) times daily.   Yes Historical Provider, MD  hydrochlorothiazide (MICROZIDE) 12.5 MG capsule Take 12.5 mg by mouth daily.   Yes Historical Provider, MD  HYDROcodone-acetaminophen (NORCO/VICODIN) 5-325 MG per tablet Take 1 tablet by mouth 2 (two) times daily as needed for pain. For pain   Yes Historical Provider, MD  Multiple Vitamin (MULTIVITAMIN  WITH MINERALS) TABS Take 1 tablet by mouth every morning.    Yes Historical Provider, MD  potassium chloride SA (K-DUR,KLOR-CON) 20 MEQ tablet 1 tablet by mouth daily 12/24/12  Yes Stacie Glaze, MD  prednisoLONE acetate (PRED FORTE) 1 % ophthalmic suspension Place 1 drop into both eyes 2 (two) times daily as needed. For eye irritation   Yes Historical Provider, MD  senna (SENOKOT) 8.6 MG tablet Take 2 tablets by mouth 2 (two) times daily.   Yes Historical Provider, MD  traMADol (ULTRAM) 50 MG tablet Take 2 tablets (100 mg total) by mouth every 6 (six) hours as needed (1-2 tablets as needed for pain). For mild arthritis pain 01/14/13  Yes Stacie Glaze, MD   Physical Exam: Filed Vitals:   03/07/13 1640 03/07/13 1700 03/07/13 1730 03/07/13 2000  BP: 125/71 163/61 143/57 160/75  Pulse:  98 89 90  Temp: 99 F (37.2 C)     TempSrc: Oral     Resp: 14 26 11 22   Height: 5\' 5"  (1.651 m)     Weight: 82.555 kg (182 lb)     SpO2: 96% 98% 98% 98%     General:  Well-developed and nourished.  Eyes: Anicteric no pallor.  ENT: No discharge from ears eyes nose mouth.  Neck: No neck rigidity. No mass felt.  Cardiovascular: S1-S2 heard.  Respiratory: No rhonchi or crepitations.  Abdomen: Soft nontender bowel sounds present.  Skin: No rash.  Musculoskeletal: No edema.  Psychiatric: Appears normal.  Neurologic: Alert awake oriented to time place and person. Moves all extremities 5 x 5. No facial symmetry. No tongue deviation. PERRLA positive.  Labs on Admission:  Basic Metabolic Panel:  Recent Labs Lab 03/07/13 1834  NA 136  K 3.3*  CL 99  CO2 23  GLUCOSE 107*  BUN 11  CREATININE 0.93  CALCIUM 10.6*   Liver Function Tests: No results found for this basename: AST, ALT, ALKPHOS, BILITOT, PROT, ALBUMIN,  in the last 168 hours No results found for this basename: LIPASE, AMYLASE,  in the last 168 hours No results found for this basename: AMMONIA,  in the last 168  hours CBC:  Recent Labs Lab 03/07/13 1834  WBC 7.3  HGB 12.0  HCT 35.6*  MCV 83.8  PLT 391   Cardiac Enzymes:  Recent Labs Lab 03/07/13 1916  TROPONINI <0.30    BNP (last 3 results) No results found for this basename: PROBNP,  in the last 8760 hours CBG: No results found for this basename: GLUCAP,  in the last 168 hours  Radiological Exams on Admission: Dg Chest 2 View  03/07/2013   CLINICAL DATA:  Shortness of breath. Cough.  EXAM: CHEST  2 VIEW  COMPARISON:  CHEST x-ray 11/08/2010.  FINDINGS: Lung volumes are normal. No acute consolidative airspace disease. No pleural effusions. No definite suspicious appearing pulmonary nodules or masses are identified. No evidence of pulmonary edema.  Heart size is normal. Mediastinal contours are distorted by patient rotation. Atherosclerosis in the thoracic aorta.  IMPRESSION: 1. No radiographic evidence of acute cardiopulmonary disease. 2. Atherosclerosis.   Electronically Signed   By: Trudie Reed M.D.   On: 03/07/2013 18:21   Ct Head Wo Contrast  03/07/2013   CLINICAL DATA:  Difficulty speaking.  Severe posterior headache.  EXAM: CT HEAD WITHOUT CONTRAST  TECHNIQUE: Contiguous axial images were obtained from the base of the skull through the vertex without intravenous contrast.  COMPARISON:  Head CT 07/03/2012.  FINDINGS: No acute intracranial abnormalities. Specifically, no evidence of acute intracranial hemorrhage, no definite findings of acute/subacute cerebral ischemia, no mass, mass effect, hydrocephalus or abnormal intra or extra-axial fluid collections. Visualized paranasal sinuses and mastoids are well pneumatized. No acute displaced skull fractures are identified.  IMPRESSION: 1. No acute intracranial abnormalities. 2. The appearance of the brain is normal.   Electronically Signed   By: Trudie Reed M.D.   On: 03/07/2013 17:58     Assessment/Plan Principal Problem:   TIA (transient ischemic attack) Active Problems:    HYPERTENSION   1. Possible TIA - symptoms are concerning for TIA versus anxiety. At this time patient has been placed on neurochecks. MRI/MRA brain 2-D echo and carotid Dopplers have been ordered. Since patient also had headache with blurred vision sedimentation rate has been ordered. Telemetry shows sinus rhythm. Neurology has been consulted for further recommendations. 2. Hypertension - continue home medications but hold off hydrochlorothiazide for now and gently hydrate. 3. Mild hypercalcemia - hold HCTZ and changed hydrate. Recheck calcium levels in a.m. Check vitamin D and parathormone levels. 4. Anxiety - continue home medications.    Code Status: Full code.  Family Communication: Patient's daughter at the bedside.  Disposition Plan: Admit for observation.    KAKRAKANDY,ARSHAD N. [facility] Pager [phone].  If 7PM-7AM, please contact night-coverage [email or web address] Password [facility] 03/07/2013, 9:29 PM

## 2013-03-08 DIAGNOSIS — I658 Occlusion and stenosis of other precerebral arteries: Secondary | ICD-10-CM

## 2013-03-08 DIAGNOSIS — I6529 Occlusion and stenosis of unspecified carotid artery: Secondary | ICD-10-CM

## 2013-03-08 DIAGNOSIS — H5310 Unspecified subjective visual disturbances: Secondary | ICD-10-CM

## 2013-03-08 DIAGNOSIS — E785 Hyperlipidemia, unspecified: Secondary | ICD-10-CM

## 2013-03-08 DIAGNOSIS — G459 Transient cerebral ischemic attack, unspecified: Secondary | ICD-10-CM | POA: Diagnosis not present

## 2013-03-08 LAB — RAPID URINE DRUG SCREEN, HOSP PERFORMED
Barbiturates: NOT DETECTED
Cocaine: NOT DETECTED
Tetrahydrocannabinol: NOT DETECTED

## 2013-03-08 LAB — URINE MICROSCOPIC-ADD ON

## 2013-03-08 LAB — URINALYSIS, ROUTINE W REFLEX MICROSCOPIC
Bilirubin Urine: NEGATIVE
Glucose, UA: NEGATIVE mg/dL
Hgb urine dipstick: NEGATIVE
Ketones, ur: NEGATIVE mg/dL
Specific Gravity, Urine: 1.006 (ref 1.005–1.030)
pH: 5.5 (ref 5.0–8.0)

## 2013-03-08 LAB — BASIC METABOLIC PANEL
BUN: 8 mg/dL (ref 6–23)
Calcium: 9.9 mg/dL (ref 8.4–10.5)
Creatinine, Ser: 0.88 mg/dL (ref 0.50–1.10)
GFR calc Af Amer: 71 mL/min — ABNORMAL LOW (ref >90)
GFR calc non Af Amer: 61 mL/min — ABNORMAL LOW (ref >90)

## 2013-03-08 LAB — SEDIMENTATION RATE: Sed Rate: 45 mm/hr — ABNORMAL HIGH (ref 0–22)

## 2013-03-08 LAB — CBC
MCHC: 34.2 g/dL (ref 30.0–36.0)
Platelets: 373 10*3/uL (ref 150–400)
RDW: 14 % (ref 11.5–15.5)
WBC: 6.6 10*3/uL (ref 4.0–10.5)

## 2013-03-08 LAB — LIPID PANEL
Cholesterol: 206 mg/dL — ABNORMAL HIGH (ref 0–200)
HDL: 42 mg/dL (ref >39)
Total CHOL/HDL Ratio: 4.9 RATIO

## 2013-03-08 LAB — HEMOGLOBIN A1C: Hgb A1c MFr Bld: 5.9 % — ABNORMAL HIGH (ref <5.7)

## 2013-03-08 MED ORDER — SIMVASTATIN 20 MG PO TABS
20.0000 mg | ORAL_TABLET | Freq: Every day | ORAL | Status: DC
  Administered 2013-03-08: 20 mg via ORAL
  Filled 2013-03-08 (×2): qty 1

## 2013-03-08 MED ORDER — POTASSIUM CHLORIDE CRYS ER 20 MEQ PO TBCR
40.0000 meq | EXTENDED_RELEASE_TABLET | ORAL | Status: AC
  Administered 2013-03-08 (×3): 40 meq via ORAL
  Filled 2013-03-08 (×4): qty 2

## 2013-03-08 MED ORDER — PANTOPRAZOLE SODIUM 40 MG PO TBEC
40.0000 mg | DELAYED_RELEASE_TABLET | Freq: Two times a day (BID) | ORAL | Status: DC
  Administered 2013-03-08 – 2013-03-09 (×2): 40 mg via ORAL
  Filled 2013-03-08 (×2): qty 1

## 2013-03-08 MED ORDER — CLOPIDOGREL BISULFATE 75 MG PO TABS
75.0000 mg | ORAL_TABLET | Freq: Every day | ORAL | Status: DC
  Administered 2013-03-09: 75 mg via ORAL
  Filled 2013-03-08 (×2): qty 1

## 2013-03-08 MED ORDER — ONDANSETRON HCL 4 MG/2ML IJ SOLN
4.0000 mg | Freq: Four times a day (QID) | INTRAMUSCULAR | Status: DC | PRN
  Administered 2013-03-08 – 2013-03-09 (×3): 4 mg via INTRAVENOUS
  Filled 2013-03-08 (×3): qty 2

## 2013-03-08 NOTE — Progress Notes (Signed)
[facility] PROGRESS NOTE  [name] NWG:[id number] DOB: 1930-12-19 DOA: 03/07/2013 PCP: Carrie Mew, MD  Assessment/Plan: 1-TIA: symptoms now resolved. Will start patient on plavix, as she has risk factors and is intoleranto ASA or NSAID's. -finish work up -start statins for HLD -follow PT/OT rec's  2-HTN: stable. Continue current regimen  3-HLD: will start zocor, LDL 143  4-ICA stenosis bilat (mild): will treat with statins and plavix.  5-Hypokalemia: will replete  6-Hypercalcemia: due to HCTZ most likely. Mild. Will follow calcium levels, PTH and vit D  7-anxiety: stable continue home meds.  Code Status: Full  Family Communication: no family at bedside  Disposition Plan: to be determine; most likely home in am   Consultants:  Neurology   Procedures:  2-D echo pending  Carotid dopplers (positive for mild 1-39% carotid artery stenosis; vertebral flow antegrade)  Antibiotics: None   HPI/Subjective: Afebrile, NAD; denies nausea and vomiting  Objective: Filed Vitals:   03/08/13 0950  BP: 116/64  Pulse: 83  Temp: 98.2 F (36.8 C)  Resp: 18   No intake or output data in the 24 hours ending 03/08/13 1059 Filed Weights   03/07/13 1640  Weight: 82.555 kg (182 lb)    Exam:   General:  Feeling better; afebrile  Cardiovascular: S1 and S2, no rubs or gallops  Respiratory: CTA bilaterally  Abdomen: soft, NT, ND, positive BS  Musculoskeletal: trace edema, no cyanosis  Data Reviewed: Basic Metabolic Panel:  Recent Labs Lab 03/07/13 1834 03/08/13 0838  NA 136 136  K 3.3* 3.0*  CL 99 101  CO2 23 23  GLUCOSE 107* 156*  BUN 11 8  CREATININE 0.93 0.88  CALCIUM 10.6* 9.9   CBC:  Recent Labs Lab 03/07/13 1834 03/08/13 0838  WBC 7.3 6.6  HGB 12.0 11.8*  HCT 35.6* 34.5*  MCV 83.8 82.9  PLT 391 373   Cardiac Enzymes:  Recent Labs Lab 03/07/13 1916  TROPONINI <0.30    Studies: Dg Chest 2 View  03/07/2013   CLINICAL  DATA:  Shortness of breath. Cough.  EXAM: CHEST  2 VIEW  COMPARISON:  CHEST x-ray 11/08/2010.  FINDINGS: Lung volumes are normal. No acute consolidative airspace disease. No pleural effusions. No definite suspicious appearing pulmonary nodules or masses are identified. No evidence of pulmonary edema. Heart size is normal. Mediastinal contours are distorted by patient rotation. Atherosclerosis in the thoracic aorta.  IMPRESSION: 1. No radiographic evidence of acute cardiopulmonary disease. 2. Atherosclerosis.   Electronically Signed   By: Trudie Reed M.D.   On: 03/07/2013 18:21   Ct Head Wo Contrast  03/07/2013   CLINICAL DATA:  Difficulty speaking.  Severe posterior headache.  EXAM: CT HEAD WITHOUT CONTRAST  TECHNIQUE: Contiguous axial images were obtained from the base of the skull through the vertex without intravenous contrast.  COMPARISON:  Head CT 07/03/2012.  FINDINGS: No acute intracranial abnormalities. Specifically, no evidence of acute intracranial hemorrhage, no definite findings of acute/subacute cerebral ischemia, no mass, mass effect, hydrocephalus or abnormal intra or extra-axial fluid collections. Visualized paranasal sinuses and mastoids are well pneumatized. No acute displaced skull fractures are identified.  IMPRESSION: 1. No acute intracranial abnormalities. 2. The appearance of the brain is normal.   Electronically Signed   By: Trudie Reed M.D.   On: 03/07/2013 17:58    Scheduled Meds: . amLODipine  5 mg Oral Daily   And  . irbesartan  300 mg Oral Daily  . [START ON 03/09/2013] clopidogrel  75 mg Oral  Q breakfast  . enoxaparin (LOVENOX) injection  40 mg Subcutaneous Q24H  . ferrous sulfate  325 mg Oral BID WC  . fesoterodine  8 mg Oral QPM  . gabapentin  100 mg Oral TID  . pantoprazole  40 mg Oral BID  . potassium chloride  40 mEq Oral Q4H  . senna  1 tablet Oral BID  . simvastatin  20 mg Oral q1800   Continuous Infusions:   Principal Problem:   TIA (transient  ischemic attack) Active Problems:   HYPERTENSION    Time spent: >30 minutes    Nobuo Nunziata  [facility] Pager [phone]. If 7PM-7AM, please contact night-coverage at [email or web address], password [facility] 03/08/2013, 10:59 AM  LOS: 1 day

## 2013-03-08 NOTE — Progress Notes (Signed)
VASCULAR LAB PRELIMINARY  PRELIMINARY  PRELIMINARY  PRELIMINARY  Carotid Dopplers completed.    Preliminary report:  There is 1-39% ICA stenosis.  Vertebral artery flow is antegrade.  Fany Cavanaugh, RVT 03/08/2013, 12:23 PM

## 2013-03-08 NOTE — Progress Notes (Signed)
Nutrition Brief Note  Patient identified on the Malnutrition Screening Tool (MST) Report for recent weight lost without trying and eating poorly because of a decreased appetite.  Per readings below, patient has has a 4% weight loss since March 2014; not significant for time frame.  Wt Readings from Last 15 Encounters:  03/07/13 182 lb (82.555 kg)  01/14/13 182 lb (82.555 kg)  09/07/12 189 lb (85.73 kg)  05/29/12 190 lb (86.183 kg)  05/08/12 191 lb (86.637 kg)  03/23/12 189 lb (85.73 kg)  03/17/12 192 lb (87.091 kg)  01/31/12 188 lb (85.276 kg)  11/29/11 178 lb (80.74 kg)  11/01/11 182 lb (82.555 kg)  11/01/11 182 lb (82.555 kg)  10/24/11 182 lb 5.1 oz (82.7 kg)  08/23/11 176 lb (79.833 kg)  06/21/11 164 lb (74.39 kg)  05/24/11 165 lb (74.844 kg)    Body mass index is 30.29 kg/(m^2). Patient meets criteria for Obesity Class I based on current BMI.   Current diet order is Heart Healthy. Labs and medications reviewed.   No nutrition interventions warranted at this time. If nutrition issues arise, please consult RD.   Maureen Chatters, RD, LDN Pager #: [phone] After-Hours Pager #: [phone]

## 2013-03-08 NOTE — Progress Notes (Signed)
  Echocardiogram 2D Echocardiogram has been performed.  Jorje Guild 03/08/2013, 2:58 PM

## 2013-03-08 NOTE — Consult Note (Signed)
Referring Physician: Toniann Fail    Chief Complaint: Dizziness, headache  HPI: [name] is an 79 y.o. female presents with complaints of headache and dizziness.  The patient reports that she will have headaches from time to time but on the night of the 13th experienced a severe occipital headche.  Described it as throbbing.  Then experienced dizziness, described as vertigo, and felt off balance when walking.  The patient took some vinegar with gradual improvement in hier symptoms  On the 14th the headache recurred and BP was taken at home.  It was elevated.  At that time patient felt she should seek medical attention.  Symptoms have now all resolved.    Date last known well: Date: 03/06/2013 Time last known well: Unable to determine tPA Given: No: Resolution of symptoms  Past Medical History  Diagnosis Date  . PUD (peptic ulcer disease)   . Unspecified essential hypertension   . Esophageal reflux   . Anemia   . Anxiety   . Arthritis   . Blood transfusion   . Cataract     surgery 2008  . Stress incontinence   . Esophageal stricture   . H. pylori infection   . Hiatal hernia   . Renal cyst   . Diverticulosis   . Esophageal dysmotility     Past Surgical History  Procedure Laterality Date  . Abdominal hysterectomy    . Rotator cuff repair    . Oophorectomy    . Cataract extraction, bilateral    . Right knee replacement    . Knee arthroplasty  11/01/2011    Procedure: COMPUTER ASSISTED TOTAL KNEE ARTHROPLASTY;  Surgeon: Harvie Junior, MD;  Location: [facility] OR;  Service: Orthopedics;  Laterality: Left;  GENERAL ANESTHESIA WITH PRE OP FEMORAL NERVE BLOCK    Family History  Problem Relation Age of Onset  . Diabetes Mother   . Colon cancer Neg Hx    Social History:  reports that she quit smoking about 22 years ago. She has never used smokeless tobacco. She reports that she does not drink alcohol or use illicit drugs.  Allergies:  Allergies  Allergen Reactions  . Nsaids Other  (See Comments)    REACTION: intolerant due to reflux, history of ulcers    Medications:  I have reviewed the patient's current medications. Prior to Admission:  Prescriptions prior to admission  Medication Sig Dispense Refill  . ALPRAZolam (XANAX) 0.5 MG tablet Take 0.25-0.5 mg by mouth at bedtime as needed for sleep. For sleep      . aluminum chloride (HYPERCARE) 20 % external solution Apply 1 application topically daily. Apply under arms      . amLODipine-olmesartan (AZOR) 5-40 MG per tablet Take 1 tablet by mouth daily.  30 tablet  6  . esomeprazole (NEXIUM) 40 MG capsule Take 40 mg by mouth daily before breakfast.      . ferrous sulfate 325 (65 FE) MG tablet Take 325 mg by mouth 2 (two) times daily.      . fesoterodine (TOVIAZ) 8 MG TB24 tablet Take 8 mg by mouth every evening.      . gabapentin (NEURONTIN) 100 MG capsule Take 100 mg by mouth 3 (three) times daily.      . hydrochlorothiazide (MICROZIDE) 12.5 MG capsule Take 12.5 mg by mouth daily.      Marland Kitchen HYDROcodone-acetaminophen (NORCO/VICODIN) 5-325 MG per tablet Take 1 tablet by mouth 2 (two) times daily as needed for pain. For pain      .  Multiple Vitamin (MULTIVITAMIN WITH MINERALS) TABS Take 1 tablet by mouth every morning.       . potassium chloride SA (K-DUR,KLOR-CON) 20 MEQ tablet 1 tablet by mouth daily  90 tablet  3  . prednisoLONE acetate (PRED FORTE) 1 % ophthalmic suspension Place 1 drop into both eyes 2 (two) times daily as needed. For eye irritation      . senna (SENOKOT) 8.6 MG tablet Take 2 tablets by mouth 2 (two) times daily.      . traMADol (ULTRAM) 50 MG tablet Take 2 tablets (100 mg total) by mouth every 6 (six) hours as needed (1-2 tablets as needed for pain). For mild arthritis pain  120 tablet  4   Scheduled: . amLODipine  5 mg Oral Daily   And  . irbesartan  300 mg Oral Daily  . aspirin  325 mg Oral Daily  . enoxaparin (LOVENOX) injection  40 mg Subcutaneous Q24H  . ferrous sulfate  325 mg Oral BID WC  .  fesoterodine  8 mg Oral QPM  . gabapentin  100 mg Oral TID  . pantoprazole  40 mg Oral Daily  . senna  1 tablet Oral BID    ROS: History obtained from the patient  General ROS: negative for - chills, fatigue, fever, night sweats, weight gain or weight loss Psychological ROS: negative for - behavioral disorder, hallucinations, memory difficulties, mood swings or suicidal ideation Ophthalmic ROS: negative for - blurry vision, double vision, eye pain or loss of vision ENT ROS: negative for - epistaxis, nasal discharge, oral lesions, sore throat, tinnitus  Allergy and Immunology ROS: negative for - hives or itchy/watery eyes Hematological and Lymphatic ROS: negative for - bleeding problems, bruising or swollen lymph nodes Endocrine ROS: negative for - galactorrhea, hair pattern changes, polydipsia/polyuria or temperature intolerance Respiratory ROS: negative for - cough, hemoptysis, shortness of breath or wheezing Cardiovascular ROS: negative for - chest pain, dyspnea on exertion, edema or irregular heartbeat Gastrointestinal ROS: negative for - abdominal pain, diarrhea, hematemesis, nausea/vomiting or stool incontinence Genito-Urinary ROS: negative for - dysuria, hematuria, incontinence or urinary frequency/urgency Musculoskeletal ROS: negative for - joint swelling or muscular weakness Neurological ROS: as noted in HPI Dermatological ROS: negative for rash and skin lesion changes  Physical Examination: Blood pressure 146/60, pulse 87, temperature 98.3 F (36.8 C), temperature source Oral, resp. rate 18, height 5\' 5"  (1.651 m), weight 82.555 kg (182 lb), SpO2 100.00%.  Neurologic Examination: Mental Status: Alert, oriented, thought content appropriate.  Speech fluent without evidence of aphasia.  Able to follow 3 step commands without difficulty. Cranial Nerves: II: Discs flat bilaterally; Visual fields grossly normal, pupils equal, round, reactive to light and accommodation III,IV, VI:  ptosis not present, extra-ocular motions intact bilaterally V,VII: smile symmetric, facial light touch sensation normal bilaterally VIII: hearing normal bilaterally IX,X: gag reflex present XI: bilateral shoulder shrug XII: midline tongue extension Motor: Right : Upper extremity   5/5    Left:     Upper extremity   5/5  Lower extremity   5/5     Lower extremity   5/5 Tone and bulk:normal tone throughout; no atrophy noted Sensory: Pinprick and light touch intact throughout, bilaterally Deep Tendon Reflexes: 2+ in the upper extremities and absent in the lower extremities Plantars: Right: downgoing   Left: downgoing Cerebellar: normal finger-to-nose and normal heel-to-shin test Gait: unable to test CV: pulses palpable throughout   Laboratory Studies:  Basic Metabolic Panel:  Recent Labs Lab 03/07/13 1834  NA  136  K 3.3*  CL 99  CO2 23  GLUCOSE 107*  BUN 11  CREATININE 0.93  CALCIUM 10.6*    Liver Function Tests: No results found for this basename: AST, ALT, ALKPHOS, BILITOT, PROT, ALBUMIN,  in the last 168 hours No results found for this basename: LIPASE, AMYLASE,  in the last 168 hours No results found for this basename: AMMONIA,  in the last 168 hours  CBC:  Recent Labs Lab 03/07/13 1834  WBC 7.3  HGB 12.0  HCT 35.6*  MCV 83.8  PLT 391    Cardiac Enzymes:  Recent Labs Lab 03/07/13 1916  TROPONINI <0.30    BNP: No components found with this basename: POCBNP,   CBG: No results found for this basename: GLUCAP,  in the last 168 hours  Microbiology: Results for orders placed during the hospital encounter of 11/01/11  URINE CULTURE     Status: None   Collection Time    11/01/11  9:33 AM      Result Value Range Status   Specimen Description URINE, CATHETERIZED   Final   Special Requests NONE   Final   Culture  Setup Time [id number]   Final   Colony Count NO GROWTH   Final   Culture NO GROWTH   Final   Report Status 11/02/2011 FINAL   Final     Coagulation Studies: No results found for this basename: LABPROT, INR,  in the last 72 hours  Urinalysis: No results found for this basename: COLORURINE, APPERANCEUR, LABSPEC, PHURINE, GLUCOSEU, HGBUR, BILIRUBINUR, KETONESUR, PROTEINUR, UROBILINOGEN, NITRITE, LEUKOCYTESUR,  in the last 168 hours  Lipid Panel: No results found for this basename: chol, trig, hdl, cholhdl, vldl, ldlcalc    HgbA1C:  Lab Results  Component Value Date   HGBA1C 6.3 07/21/2009    Urine Drug Screen:   No results found for this basename: labopia, cocainscrnur, labbenz, amphetmu, thcu, labbarb    Alcohol Level: No results found for this basename: ETH,  in the last 168 hours  Other results: EKG: sinus rhythm at 87 bpm.  Imaging: Dg Chest 2 View  03/07/2013   CLINICAL DATA:  Shortness of breath. Cough.  EXAM: CHEST  2 VIEW  COMPARISON:  CHEST x-ray 11/08/2010.  FINDINGS: Lung volumes are normal. No acute consolidative airspace disease. No pleural effusions. No definite suspicious appearing pulmonary nodules or masses are identified. No evidence of pulmonary edema. Heart size is normal. Mediastinal contours are distorted by patient rotation. Atherosclerosis in the thoracic aorta.  IMPRESSION: 1. No radiographic evidence of acute cardiopulmonary disease. 2. Atherosclerosis.   Electronically Signed   By: Trudie Reed M.D.   On: 03/07/2013 18:21   Ct Head Wo Contrast  03/07/2013   CLINICAL DATA:  Difficulty speaking.  Severe posterior headache.  EXAM: CT HEAD WITHOUT CONTRAST  TECHNIQUE: Contiguous axial images were obtained from the base of the skull through the vertex without intravenous contrast.  COMPARISON:  Head CT 07/03/2012.  FINDINGS: No acute intracranial abnormalities. Specifically, no evidence of acute intracranial hemorrhage, no definite findings of acute/subacute cerebral ischemia, no mass, mass effect, hydrocephalus or abnormal intra or extra-axial fluid collections. Visualized paranasal sinuses  and mastoids are well pneumatized. No acute displaced skull fractures are identified.  IMPRESSION: 1. No acute intracranial abnormalities. 2. The appearance of the brain is normal.   Electronically Signed   By: Trudie Reed M.D.   On: 03/07/2013 17:58    Assessment: 79 y.o. female presenting with an episode of dizziness and  headache.  BP has been elevated.  Currently BP normal and symptoms have resolved.  Patient with multiple vascular risk factors.  Can not rule out the possibility of a TIA.  Further work up indicated.    Stroke Risk Factors - hypertension  Plan: 1. HgbA1c, fasting lipid panel 2. MRI, MRA  of the brain without contrast 3. Echocardiogram 4. Carotid dopplers 5. Prophylactic therapy-Antiplatelet med: Aspirin - dose 325mg  daily 6. BP control 7. Telemetry monitoring 8. Frequent neuro checks   Thana Farr, MD [facility] [phone] 03/08/2013, 1:14 AM

## 2013-03-09 ENCOUNTER — Observation Stay (HOSPITAL_COMMUNITY): Payer: Medicare Other

## 2013-03-09 DIAGNOSIS — E876 Hypokalemia: Secondary | ICD-10-CM

## 2013-03-09 DIAGNOSIS — G459 Transient cerebral ischemic attack, unspecified: Secondary | ICD-10-CM | POA: Diagnosis not present

## 2013-03-09 DIAGNOSIS — R42 Dizziness and giddiness: Secondary | ICD-10-CM

## 2013-03-09 DIAGNOSIS — R51 Headache: Secondary | ICD-10-CM

## 2013-03-09 LAB — PARATHYROID HORMONE, INTACT (NO CA): PTH: 10.6 pg/mL — ABNORMAL LOW (ref 14.0–72.0)

## 2013-03-09 LAB — VITAMIN D 25 HYDROXY (VIT D DEFICIENCY, FRACTURES): Vit D, 25-Hydroxy: 57 ng/mL (ref 30–89)

## 2013-03-09 NOTE — Progress Notes (Signed)
Pt taken down for MRI at 2100,later got a call from MRI that pt is very nauseous and vomiting so the test was not done,pt returned to room at 2154,iv zofran 4mg  given at 2215,pt cleaned up and made comfortable in bed,will continue to monitor. Obasogie-Asidi, Khaled Herda Efe

## 2013-03-09 NOTE — Plan of Care (Signed)
Dc instructions given to pt. V/u. No med scripts given to pt. Pt is stable to be d/c. Iv and tele removed.

## 2013-03-09 NOTE — Discharge Summary (Signed)
Physician Discharge Summary  ALLYSEN LAZO WUJ:[id number] DOB: [birth year] DOA: 03/07/2013  PCP: Carrie Mew, MD  Admit date: 03/07/2013 Discharge date: 03/09/2013  Time spent: >25 minutes  minutes  Recommendations for Outpatient Follow-up:   F/u with PCP in 1-2 weeks  Discharge Diagnoses:  Principal Problem:   TIA (transient ischemic attack) Active Problems:   HYPERTENSION   Discharge Condition: stable   Diet recommendation: heart healthy   Filed Weights   03/07/13 1640  Weight: 82.555 kg (182 lb)    History of present illness:  79 y.o. female history of hypertension and anxiety started experiencing blurred vision and difficulty ambulating with headache and dizziness    Hospital Course:  Patient is admitted with headaches, and dizziness which resolved; neuro exam non focal; work up was unremarkable including head MRI; doppler < 39%; echo: LVEF 60%, no embolism; HA1C-5.9 -symptoms likely HTN related  -patient was evaluated by neurology who recommend to f/u outpatient   Patient was given extra potassium on 9/15; recommended to f/u with PCP to recheck potassium next week   -cont PO kcl   Procedures: MRI, CT no acute CVA  Consultations:  Neurology   Discharge Exam: Filed Vitals:   03/09/13 1013  BP: 129/57  Pulse: 74  Temp: 98.7 F (37.1 C)  Resp: 20    General: alert,  Cardiovascular: S1, S2 rrr Respiratory: CTA BL   Discharge Instructions  Discharge Orders   Future Appointments Provider Department Dept Phone   05/17/2013 1:15 PM Stacie Glaze, MD [facility] [phone]   Future Orders Complete By Expires   Diet - low sodium heart healthy  As directed    Discharge instructions  As directed    Comments:     F/u with primary care doctor in 1 week to recheck your potassium level   Increase activity slowly  As directed        Medication List         ALPRAZolam 0.5 MG tablet  Commonly known as:  XANAX  Take 0.25-0.5  mg by mouth at bedtime as needed for sleep. For sleep     amLODipine-olmesartan 5-40 MG per tablet  Commonly known as:  AZOR  Take 1 tablet by mouth daily.     esomeprazole 40 MG capsule  Commonly known as:  NEXIUM  Take 40 mg by mouth daily before breakfast.     ferrous sulfate 325 (65 FE) MG tablet  Take 325 mg by mouth 2 (two) times daily.     fesoterodine 8 MG Tb24 tablet  Commonly known as:  TOVIAZ  Take 8 mg by mouth every evening.     gabapentin 100 MG capsule  Commonly known as:  NEURONTIN  Take 100 mg by mouth 3 (three) times daily.     hydrochlorothiazide 12.5 MG capsule  Commonly known as:  MICROZIDE  Take 12.5 mg by mouth daily.     HYDROcodone-acetaminophen 5-325 MG per tablet  Commonly known as:  NORCO/VICODIN  Take 1 tablet by mouth 2 (two) times daily as needed for pain. For pain     HYPERCARE 20 % external solution  Generic drug:  aluminum chloride  Apply 1 application topically daily. Apply under arms     multivitamin with minerals Tabs tablet  Take 1 tablet by mouth every morning.     potassium chloride SA 20 MEQ tablet  Commonly known as:  K-DUR,KLOR-CON  1 tablet by mouth daily     prednisoLONE acetate 1 % ophthalmic  suspension  Commonly known as:  PRED FORTE  Place 1 drop into both eyes 2 (two) times daily as needed. For eye irritation     senna 8.6 MG tablet  Commonly known as:  SENOKOT  Take 2 tablets by mouth 2 (two) times daily.     traMADol 50 MG tablet  Commonly known as:  ULTRAM  Take 2 tablets (100 mg total) by mouth every 6 (six) hours as needed (1-2 tablets as needed for pain). For mild arthritis pain       Allergies  Allergen Reactions  . Nsaids Other (See Comments)    REACTION: intolerant due to reflux, history of ulcers       Follow-up Information   Follow up with Carrie Mew, MD In 1 week.   Specialty:  Internal Medicine   Contact information:   [address] [place] [address] [phone]         The results of significant diagnostics from this hospitalization (including imaging, microbiology, ancillary and laboratory) are listed below for reference.    Significant Diagnostic Studies: Dg Chest 2 View  03/07/2013   CLINICAL DATA:  Shortness of breath. Cough.  EXAM: CHEST  2 VIEW  COMPARISON:  CHEST x-ray 11/08/2010.  FINDINGS: Lung volumes are normal. No acute consolidative airspace disease. No pleural effusions. No definite suspicious appearing pulmonary nodules or masses are identified. No evidence of pulmonary edema. Heart size is normal. Mediastinal contours are distorted by patient rotation. Atherosclerosis in the thoracic aorta.  IMPRESSION: 1. No radiographic evidence of acute cardiopulmonary disease. 2. Atherosclerosis.   Electronically Signed   By: Trudie Reed M.D.   On: 03/07/2013 18:21   Ct Head Wo Contrast  03/07/2013   CLINICAL DATA:  Difficulty speaking.  Severe posterior headache.  EXAM: CT HEAD WITHOUT CONTRAST  TECHNIQUE: Contiguous axial images were obtained from the base of the skull through the vertex without intravenous contrast.  COMPARISON:  Head CT 07/03/2012.  FINDINGS: No acute intracranial abnormalities. Specifically, no evidence of acute intracranial hemorrhage, no definite findings of acute/subacute cerebral ischemia, no mass, mass effect, hydrocephalus or abnormal intra or extra-axial fluid collections. Visualized paranasal sinuses and mastoids are well pneumatized. No acute displaced skull fractures are identified.  IMPRESSION: 1. No acute intracranial abnormalities. 2. The appearance of the brain is normal.   Electronically Signed   By: Trudie Reed M.D.   On: 03/07/2013 17:58   Mri Brain Without Contrast  03/09/2013   *RADIOLOGY REPORT*  Clinical Data:  TIA.  Headache.  Difficulty speaking  MRI HEAD WITHOUT CONTRAST MRA HEAD WITHOUT CONTRAST  Technique:  Multiplanar, multiecho pulse sequences of the brain and surrounding structures were obtained  without intravenous contrast. Angiographic images of the head were obtained using MRA technique without contrast.  Comparison:  CT head 03/07/2013.  MRI HEAD  Findings:  Abnormal bone marrow signal at C3 and C4 which is  low signal on T1 suggesting sclerosis.  This could be due to advanced disc degeneration or metastatic disease.  There is spinal stenosis at C3-4.  Further evaluation with cervical MRI is suggested.  No skull lesions are seen suggestive of metastatic disease.  Negative for acute infarct.  Negative for hemorrhage or mass.  Ventricle size is normal.  Small chronic infarct right cerebellum posteriorly.  Cerebral white matter is intact.  Brainstem is intact.  Paranasal sinuses are clear.  IMPRESSION: Negative for acute infarct.  Abnormal appearance at C2 and C3 which could be due to disc degeneration  and sclerotic change versus metastatic disease.  There is spinal stenosis at this level.  Further evaluation with cervical MRI without and with contrast is suggested.  MRA HEAD  Findings: Left vertebral artery is dominant and widely patent. Right vertebral artery is hypoplastic distally but does contribute to the basilar.  The basilar is widely patent.  Superior cerebellar and posterior cerebral arteries are patent bilaterally  Internal carotid artery is patent bilaterally without stenosis. Anterior and middle cerebral arteries are widely patent without stenosis.  Negative for cerebral aneurysm.  IMPRESSION: Negative   Original Report Authenticated By: Janeece Riggers, M.D.   Mr Mra Head/brain Wo Cm  03/09/2013   *RADIOLOGY REPORT*  Clinical Data:  TIA.  Headache.  Difficulty speaking  MRI HEAD WITHOUT CONTRAST MRA HEAD WITHOUT CONTRAST  Technique:  Multiplanar, multiecho pulse sequences of the brain and surrounding structures were obtained without intravenous contrast. Angiographic images of the head were obtained using MRA technique without contrast.  Comparison:  CT head 03/07/2013.  MRI HEAD  Findings:   Abnormal bone marrow signal at C3 and C4 which is  low signal on T1 suggesting sclerosis.  This could be due to advanced disc degeneration or metastatic disease.  There is spinal stenosis at C3-4.  Further evaluation with cervical MRI is suggested.  No skull lesions are seen suggestive of metastatic disease.  Negative for acute infarct.  Negative for hemorrhage or mass.  Ventricle size is normal.  Small chronic infarct right cerebellum posteriorly.  Cerebral white matter is intact.  Brainstem is intact.  Paranasal sinuses are clear.  IMPRESSION: Negative for acute infarct.  Abnormal appearance at C2 and C3 which could be due to disc degeneration and sclerotic change versus metastatic disease.  There is spinal stenosis at this level.  Further evaluation with cervical MRI without and with contrast is suggested.  MRA HEAD  Findings: Left vertebral artery is dominant and widely patent. Right vertebral artery is hypoplastic distally but does contribute to the basilar.  The basilar is widely patent.  Superior cerebellar and posterior cerebral arteries are patent bilaterally  Internal carotid artery is patent bilaterally without stenosis. Anterior and middle cerebral arteries are widely patent without stenosis.  Negative for cerebral aneurysm.  IMPRESSION: Negative   Original Report Authenticated By: Janeece Riggers, M.D.    Microbiology: No results found for this or any previous visit (from the past 240 hour(s)).   Labs: Basic Metabolic Panel:  Recent Labs Lab 03/07/13 1834 03/08/13 0838 03/08/13 1355  NA 136 136  --   K 3.3* 3.0*  --   CL 99 101  --   CO2 23 23  --   GLUCOSE 107* 156*  --   BUN 11 8  --   CREATININE 0.93 0.88  --   CALCIUM 10.6* 9.9  --   MG  --   --  1.6   Liver Function Tests: No results found for this basename: AST, ALT, ALKPHOS, BILITOT, PROT, ALBUMIN,  in the last 168 hours No results found for this basename: LIPASE, AMYLASE,  in the last 168 hours No results found for this  basename: AMMONIA,  in the last 168 hours CBC:  Recent Labs Lab 03/07/13 1834 03/08/13 0838  WBC 7.3 6.6  HGB 12.0 11.8*  HCT 35.6* 34.5*  MCV 83.8 82.9  PLT 391 373   Cardiac Enzymes:  Recent Labs Lab 03/07/13 1916  TROPONINI <0.30   BNP: BNP (last 3 results) No results found for this basename: PROBNP,  in the last 8760 hours CBG: No results found for this basename: GLUCAP,  in the last 168 hours     Signed:  Esperanza Sheets  [facility] 03/09/2013, 11:17 AM

## 2013-03-09 NOTE — Progress Notes (Signed)
NEURO HOSPITALIST PROGRESS NOTE   SUBJECTIVE:                                                                                                                        Complains of frontal headache. Asking if she can go home today. Otherwise, she denies dizziness, double vision, difficulty swallowing, slurred speech, focal weakness or numbness, imbalance, language or visual disturbance. Brain MRI/MRA, CUS, TTE are unremarkable. Cholesterol 206, HDL 42, LDL 143.  OBJECTIVE:                                                                                                                           Vital signs in last 24 hours: Temp:  [98.2 F (36.8 C)-99.1 F (37.3 C)] 98.7 F (37.1 C) (09/16 1013) Pulse Rate:  [71-81] 74 (09/16 1013) Resp:  [18-20] 20 (09/16 1013) BP: (113-138)/(46-70) 129/57 mmHg (09/16 1013) SpO2:  [94 %-98 %] 97 % (09/16 1013)  Intake/Output from previous day: 09/15 0701 - 09/16 0700 In: 240 [P.O.:240] Out: -  Intake/Output this shift:   Nutritional status: Cardiac  Past Medical History  Diagnosis Date  . PUD (peptic ulcer disease)   . Unspecified essential hypertension   . Esophageal reflux   . Anemia   . Anxiety   . Arthritis   . Blood transfusion   . Cataract     surgery 2008  . Stress incontinence   . Esophageal stricture   . H. pylori infection   . Hiatal hernia   . Renal cyst   . Diverticulosis   . Esophageal dysmotility     Neurologic Exam:  Mental Status: Alert, oriented, thought content appropriate.  Speech fluent without evidence of aphasia.  Able to follow 3 step commands without difficulty. Cranial Nerves: II: Discs flat bilaterally; Visual fields grossly normal, pupils equal, round, reactive to light and accommodation III,IV, VI: ptosis not present, extra-ocular motions intact bilaterally V,VII: smile symmetric, facial light touch sensation normal bilaterally VIII: hearing normal  bilaterally IX,X: gag reflex present XI: bilateral shoulder shrug XII: midline tongue extension Motor: Right : Upper extremity   5/5    Left:     Upper extremity   5/5  Lower extremity   5/5  Lower extremity   5/5 Tone and bulk:normal tone throughout; no atrophy noted Sensory: Pinprick and light touch intact throughout, bilaterally Deep Tendon Reflexes:  Right: Upper Extremity   Left: Upper extremity   biceps (C-5 to C-6) 2/4   biceps (C-5 to C-6) 2/4 tricep (C7) 2/4    triceps (C7) 2/4 Brachioradialis (C6) 2/4  Brachioradialis (C6) 2/4  Lower Extremity Lower Extremity  quadriceps (L-2 to L-4) 2/4   quadriceps (L-2 to L-4) 2/4 Achilles (S1) 2/4   Achilles (S1) 2/4  Plantars: Right: downgoing   Left: downgoing Cerebellar: normal finger-to-nose,  normal heel-to-shin test Gait:  No ataxia. CV: pulses palpable throughout    Lab Results: Lab Results  Component Value Date/Time   CHOL 206* 03/08/2013  8:38 AM   Lipid Panel  Recent Labs  03/08/13 0838  CHOL 206*  TRIG 105  HDL 42  CHOLHDL 4.9  VLDL 21  LDLCALC 454*    Studies/Results: Dg Chest 2 View  03/07/2013   CLINICAL DATA:  Shortness of breath. Cough.  EXAM: CHEST  2 VIEW  COMPARISON:  CHEST x-ray 11/08/2010.  FINDINGS: Lung volumes are normal. No acute consolidative airspace disease. No pleural effusions. No definite suspicious appearing pulmonary nodules or masses are identified. No evidence of pulmonary edema. Heart size is normal. Mediastinal contours are distorted by patient rotation. Atherosclerosis in the thoracic aorta.  IMPRESSION: 1. No radiographic evidence of acute cardiopulmonary disease. 2. Atherosclerosis.   Electronically Signed   By: Trudie Reed M.D.   On: 03/07/2013 18:21   Ct Head Wo Contrast  03/07/2013   CLINICAL DATA:  Difficulty speaking.  Severe posterior headache.  EXAM: CT HEAD WITHOUT CONTRAST  TECHNIQUE: Contiguous axial images were obtained from the base of the skull through the  vertex without intravenous contrast.  COMPARISON:  Head CT 07/03/2012.  FINDINGS: No acute intracranial abnormalities. Specifically, no evidence of acute intracranial hemorrhage, no definite findings of acute/subacute cerebral ischemia, no mass, mass effect, hydrocephalus or abnormal intra or extra-axial fluid collections. Visualized paranasal sinuses and mastoids are well pneumatized. No acute displaced skull fractures are identified.  IMPRESSION: 1. No acute intracranial abnormalities. 2. The appearance of the brain is normal.   Electronically Signed   By: Trudie Reed M.D.   On: 03/07/2013 17:58   Mri Brain Without Contrast  03/09/2013   *RADIOLOGY REPORT*  Clinical Data:  TIA.  Headache.  Difficulty speaking  MRI HEAD WITHOUT CONTRAST MRA HEAD WITHOUT CONTRAST  Technique:  Multiplanar, multiecho pulse sequences of the brain and surrounding structures were obtained without intravenous contrast. Angiographic images of the head were obtained using MRA technique without contrast.  Comparison:  CT head 03/07/2013.  MRI HEAD  Findings:  Abnormal bone marrow signal at C3 and C4 which is  low signal on T1 suggesting sclerosis.  This could be due to advanced disc degeneration or metastatic disease.  There is spinal stenosis at C3-4.  Further evaluation with cervical MRI is suggested.  No skull lesions are seen suggestive of metastatic disease.  Negative for acute infarct.  Negative for hemorrhage or mass.  Ventricle size is normal.  Small chronic infarct right cerebellum posteriorly.  Cerebral white matter is intact.  Brainstem is intact.  Paranasal sinuses are clear.  IMPRESSION: Negative for acute infarct.  Abnormal appearance at C2 and C3 which could be due to disc degeneration and sclerotic change versus metastatic disease.  There is spinal stenosis at this level.  Further evaluation with cervical MRI without and with  contrast is suggested.  MRA HEAD  Findings: Left vertebral artery is dominant and widely  patent. Right vertebral artery is hypoplastic distally but does contribute to the basilar.  The basilar is widely patent.  Superior cerebellar and posterior cerebral arteries are patent bilaterally  Internal carotid artery is patent bilaterally without stenosis. Anterior and middle cerebral arteries are widely patent without stenosis.  Negative for cerebral aneurysm.  IMPRESSION: Negative   Original Report Authenticated By: Janeece Riggers, M.D.   Mr Mra Head/brain Wo Cm  03/09/2013   *RADIOLOGY REPORT*  Clinical Data:  TIA.  Headache.  Difficulty speaking  MRI HEAD WITHOUT CONTRAST MRA HEAD WITHOUT CONTRAST  Technique:  Multiplanar, multiecho pulse sequences of the brain and surrounding structures were obtained without intravenous contrast. Angiographic images of the head were obtained using MRA technique without contrast.  Comparison:  CT head 03/07/2013.  MRI HEAD  Findings:  Abnormal bone marrow signal at C3 and C4 which is  low signal on T1 suggesting sclerosis.  This could be due to advanced disc degeneration or metastatic disease.  There is spinal stenosis at C3-4.  Further evaluation with cervical MRI is suggested.  No skull lesions are seen suggestive of metastatic disease.  Negative for acute infarct.  Negative for hemorrhage or mass.  Ventricle size is normal.  Small chronic infarct right cerebellum posteriorly.  Cerebral white matter is intact.  Brainstem is intact.  Paranasal sinuses are clear.  IMPRESSION: Negative for acute infarct.  Abnormal appearance at C2 and C3 which could be due to disc degeneration and sclerotic change versus metastatic disease.  There is spinal stenosis at this level.  Further evaluation with cervical MRI without and with contrast is suggested.  MRA HEAD  Findings: Left vertebral artery is dominant and widely patent. Right vertebral artery is hypoplastic distally but does contribute to the basilar.  The basilar is widely patent.  Superior cerebellar and posterior cerebral  arteries are patent bilaterally  Internal carotid artery is patent bilaterally without stenosis. Anterior and middle cerebral arteries are widely patent without stenosis.  Negative for cerebral aneurysm.  IMPRESSION: Negative   Original Report Authenticated By: Janeece Riggers, M.D.    MEDICATIONS                                                                                                                       I have reviewed the patient's current medications.  ASSESSMENT/PLAN:  Dizziness resolved but still with HA which she tells me is nothing new. Non focal neuro-exam and unremarkable neuro-imaging. Dyslipidemia will need tighter control.' She can go home today from a neuro standpoint. Please call neurology with questions or concerns.  Wyatt Portela, MD [facility] [phone]  03/09/2013, 10:24 AM

## 2013-03-10 NOTE — ED Provider Notes (Signed)
Medical screening examination/treatment/procedure(s) were conducted as a shared visit with non-physician practitioner(s) and myself.  I personally evaluated the patient during the encounter On my exam this pleasant elderly F was in NAD, w no neuro deficits.  However, with her description of  (now resolved) neurologic changes, there is some concern for TIA, and the patient required admission for further E/M.   Gerhard Munch, MD 03/10/13 2244

## 2013-03-11 ENCOUNTER — Other Ambulatory Visit: Payer: Self-pay | Admitting: Internal Medicine

## 2013-03-11 ENCOUNTER — Telehealth: Payer: Self-pay | Admitting: Internal Medicine

## 2013-03-11 NOTE — Telephone Encounter (Signed)
Appt made for Mon, 9/22 w/ Padonda.

## 2013-03-11 NOTE — Telephone Encounter (Signed)
Patient was DC'd from hospital on Tues. They said to have Dr. Lovell Sheehan check her potassium in within 1 wk. Please advise if ok. I can call to sched, if you place order.

## 2013-03-11 NOTE — Telephone Encounter (Signed)
She needs post hospital f/u- if you will call and make appointment with padonda for 1 day next week I will call her tomorrow and talk with her --thanks

## 2013-03-12 ENCOUNTER — Telehealth: Payer: Self-pay | Admitting: *Deleted

## 2013-03-12 NOTE — Telephone Encounter (Signed)
Transitional care  Admit date:03/07/2013 discharge date; 03/09/2013  Discharge diagnoses: Principal problem:   TIA Active problems:    Hypertension  Recommendations for outpatient follow-up: F/u with pcp in 1-2 week and recheck potassium  Discharge:stable  Talked with patient and she is improved.  Her bp continues to fluctuate, but staying lower since potassium has been regulated.  She is compliant with her medication and appetite is good. No further headaches.    Patient has appointment for post hospital with Adline Mango on 03/15/13  And patient is aware

## 2013-03-12 NOTE — Telephone Encounter (Signed)
Opened in error

## 2013-03-15 ENCOUNTER — Encounter: Payer: Self-pay | Admitting: Family

## 2013-03-15 ENCOUNTER — Ambulatory Visit (INDEPENDENT_AMBULATORY_CARE_PROVIDER_SITE_OTHER): Payer: Medicare Other | Admitting: Family

## 2013-03-15 VITALS — BP 108/68 | HR 100 | Wt 179.0 lb

## 2013-03-15 DIAGNOSIS — Z23 Encounter for immunization: Secondary | ICD-10-CM

## 2013-03-15 DIAGNOSIS — I10 Essential (primary) hypertension: Secondary | ICD-10-CM

## 2013-03-15 DIAGNOSIS — G459 Transient cerebral ischemic attack, unspecified: Secondary | ICD-10-CM

## 2013-03-15 DIAGNOSIS — E876 Hypokalemia: Secondary | ICD-10-CM

## 2013-03-15 LAB — BASIC METABOLIC PANEL
BUN: 16 mg/dL (ref 6–23)
CO2: 27 mEq/L (ref 19–32)
Calcium: 10.2 mg/dL (ref 8.4–10.5)
Creatinine, Ser: 1.2 mg/dL (ref 0.4–1.2)
GFR: 54.56 mL/min — ABNORMAL LOW (ref >60.00)
Glucose, Bld: 100 mg/dL — ABNORMAL HIGH (ref 70–99)
Sodium: 131 mEq/L — ABNORMAL LOW (ref 135–145)

## 2013-03-15 MED ORDER — FEXOFENADINE HCL 180 MG PO TABS: 180.0000 mg | ORAL_TABLET | Freq: Every day | ORAL | Status: DC

## 2013-03-15 MED ORDER — GABAPENTIN 300 MG PO CAPS: 300.0000 mg | ORAL_CAPSULE | Freq: Two times a day (BID) | ORAL | Status: DC

## 2013-03-15 NOTE — Progress Notes (Signed)
Subjective:    Patient ID: [name], female    DOB: 1931-01-27, 79 y.o.   MRN: [id number]  HPI  30-year-old American female, nonsmoker, patient of Dr. Lovell Sheehan and as a hospital followup. She was seen in the hospital on 03/07/2013 and diagnosed with a TIA after presenting with difficulty speaking. Her symptoms resolved. She had a carotid Doppler done that was stable. Denies any concerns today. She was found to be hypokalemic at 3.0. Requesting a refill on Allegra for allergic rhinitis for a tickle in her throat. Reports having a cough related to the esophageal motility disorder and taken Neurontin 100 mg 3 times a day she felt like it's not helping.  Review of Systems  Constitutional: Negative.   HENT: Negative.   Respiratory: Negative.   Cardiovascular: Negative.   Gastrointestinal: Negative.   Genitourinary: Negative.   Musculoskeletal: Negative.   Skin: Negative.   Allergic/Immunologic: Negative.   Neurological: Negative.  Negative for dizziness and weakness.  Hematological: Negative.   Psychiatric/Behavioral: Negative.    Past Medical History  Diagnosis Date  . PUD (peptic ulcer disease)   . Unspecified essential hypertension   . Esophageal reflux   . Anemia   . Anxiety   . Arthritis   . Blood transfusion   . Cataract     surgery 2008  . Stress incontinence   . Esophageal stricture   . H. pylori infection   . Hiatal hernia   . Renal cyst   . Diverticulosis   . Esophageal dysmotility     History   Social History  . Marital Status: Single    Spouse Name: N/A    Number of Children: N/A  . Years of Education: N/A   Occupational History  . Glass blower/designer   Social History Main Topics  . Smoking status: Former Smoker    Quit date: 06/24/1990  . Smokeless tobacco: Never Used     Comment: quit 1985  . Alcohol Use: No  . Drug Use: No  . Sexual Activity: Not Currently   Other Topics Concern  . Not on file   Social History Narrative  . No narrative on file     Past Surgical History  Procedure Laterality Date  . Abdominal hysterectomy    . Rotator cuff repair    . Oophorectomy    . Cataract extraction, bilateral    . Right knee replacement    . Knee arthroplasty  11/01/2011    Procedure: COMPUTER ASSISTED TOTAL KNEE ARTHROPLASTY;  Surgeon: Harvie Junior, MD;  Location: [facility] OR;  Service: Orthopedics;  Laterality: Left;  GENERAL ANESTHESIA WITH PRE OP FEMORAL NERVE BLOCK    Family History  Problem Relation Age of Onset  . Diabetes Mother   . Colon cancer Neg Hx     Allergies  Allergen Reactions  . Nsaids Other (See Comments)    REACTION: intolerant due to reflux, history of ulcers    Current Outpatient Prescriptions on File Prior to Visit  Medication Sig Dispense Refill  . ALPRAZolam (XANAX) 0.5 MG tablet Take 0.25-0.5 mg by mouth at bedtime as needed for sleep. For sleep      . aluminum chloride (HYPERCARE) 20 % external solution Apply 1 application topically daily. Apply under arms      . amLODipine-olmesartan (AZOR) 5-40 MG per tablet Take 1 tablet by mouth daily.  30 tablet  6  . esomeprazole (NEXIUM) 40 MG capsule Take 40 mg by mouth daily before breakfast.      .  ferrous sulfate 325 (65 FE) MG tablet Take 325 mg by mouth 2 (two) times daily.      . fesoterodine (TOVIAZ) 8 MG TB24 tablet Take 8 mg by mouth every evening.      . hydrochlorothiazide (MICROZIDE) 12.5 MG capsule Take 12.5 mg by mouth daily.      Marland Kitchen HYDROcodone-acetaminophen (NORCO/VICODIN) 5-325 MG per tablet Take 1 tablet by mouth 2 (two) times daily as needed for pain. For pain      . Multiple Vitamin (MULTIVITAMIN WITH MINERALS) TABS Take 1 tablet by mouth every morning.       . potassium chloride SA (K-DUR,KLOR-CON) 20 MEQ tablet 1 tablet by mouth daily  90 tablet  3  . prednisoLONE acetate (PRED FORTE) 1 % ophthalmic suspension Place 1 drop into both eyes 2 (two) times daily as needed. For eye irritation      . senna (SENOKOT) 8.6 MG tablet Take 2 tablets by mouth  2 (two) times daily.      . traMADol (ULTRAM) 50 MG tablet TAKE 2 TABLETS BY MOUTH EVERY 6 HOURS AS NEEDED FOR MILD ARTHRITIS PAIN  120 tablet  4  . [DISCONTINUED] fesoterodine (TOVIAZ) 8 MG TB24 Take 1 tablet (8 mg total) by mouth daily.  30 tablet  6   No current facility-administered medications on file prior to visit.    BP 108/68  Pulse 100  Wt 179 lb (81.194 kg)  BMI 29.79 kg/m2chart    Objective:   Physical Exam  Constitutional: She is oriented to person, place, and time. She appears well-developed and well-nourished.  HENT:  Right Ear: External ear normal.  Left Ear: External ear normal.  Nose: Nose normal.  Mouth/Throat: Oropharynx is clear and moist.  Neck: Normal range of motion. Neck supple. No thyromegaly present.  Cardiovascular: Normal rate, regular rhythm and normal heart sounds.   Pulmonary/Chest: Effort normal and breath sounds normal.  Musculoskeletal: Normal range of motion.  Neurological: She is alert and oriented to person, place, and time.  Skin: Skin is warm and dry.  Psychiatric: She has a normal mood and affect.          Assessment & Plan:  Assessment: 1. TIA 2. Hypokalemia 3. Hypertension 4. Allergic rhinitis  Plan: Increase Neurontin to 300 mg twice a day and ultimately as tolerated to 3 times a day. Refilled Allegra. BMP sent to recheck potassium. Stroke prevention information provided today. Patient called the office with any questions or concerns. Recheck as scheduled, and as needed.

## 2013-03-15 NOTE — Patient Instructions (Addendum)
Stroke Prevention Some medical conditions and behaviors are associated with an increased chance of having a stroke. You may prevent a stroke by making healthy choices and managing medical conditions. Reduce your risk of having a stroke by:  Staying physically active. Get at least 30 minutes of activity on most or all days.  Not smoking. It may also be helpful to avoid exposure to secondhand smoke.  Limiting alcohol use. Moderate alcohol use is considered to be:  No more than 2 drinks per day for men.  No more than 1 drink per day for nonpregnant women.  Eating healthy foods.  Include 5 or more servings of fruits and vegetables a day.  Certain diets may be prescribed to address high blood pressure, high cholesterol, diabetes, or obesity.  Managing your cholesterol levels.  A low-saturated fat, low-trans fat, low-cholesterol, and high-fiber diet may control cholesterol levels.  Take any prescribed medicines to control cholesterol as directed by your caregiver.  Managing your diabetes.  A controlled-carbohydrate, controlled-sugar diet is recommended to manage diabetes.  Take any prescribed medicines to control diabetes as directed by your caregiver.  Controlling your high blood pressure (hypertension).  A low-salt (sodium), low-saturated fat, low-trans fat, and low-cholesterol diet is recommended to manage high blood pressure.  Take any prescribed medicines to control hypertension as directed by your caregiver.  Maintaining a healthy weight.  A reduced-calorie, low-sodium, low-saturated fat, low-trans fat, low-cholesterol diet is recommended to manage weight.  Stopping drug abuse.  Avoiding birth control pills.  Talk to your caregiver about the risks of taking birth control pills if you are over 35 years old, smoke, get migraines, or have ever had a blood clot.  Getting evaluated for sleep disorders (sleep apnea).  Talk to your caregiver about getting a sleep evaluation  if you snore a lot or have excessive sleepiness.  Taking medicines as directed by your caregiver.  For some people, aspirin or blood thinners (anticoagulants) are helpful in reducing the risk of forming abnormal blood clots that can lead to stroke. If you have the irregular heart rhythm of atrial fibrillation, you should be on a blood thinner unless there is a good reason you cannot take them.  Understand all your medicine instructions. SEEK IMMEDIATE MEDICAL CARE IF:   You have sudden weakness or numbness of the face, arm, or leg, especially on one side of the body.  You have sudden confusion.  You have trouble speaking (aphasia) or understanding.  You have sudden trouble seeing in one or both eyes.  You have sudden trouble walking.  You have dizziness.  You have a loss of balance or coordination.  You have a sudden, severe headache with no known cause.  You have new chest pain or an irregular heartbeat. Any of these symptoms may represent a serious problem that is an emergency. Do not wait to see if the symptoms will go away. Get medical help right away. Call your local emergency services (911 in [place] Do not drive yourself to the hospital. Document Released: 07/18/2004 Document Revised: 09/02/2011 Document Reviewed: 01/28/2011 [employer] Patient Information 2014 [employer].  

## 2013-04-10 ENCOUNTER — Other Ambulatory Visit: Payer: Self-pay | Admitting: Internal Medicine

## 2013-04-18 ENCOUNTER — Other Ambulatory Visit: Payer: Self-pay | Admitting: Internal Medicine

## 2013-05-12 ENCOUNTER — Other Ambulatory Visit: Payer: Self-pay | Admitting: Internal Medicine

## 2013-05-14 ENCOUNTER — Encounter: Payer: Self-pay | Admitting: Internal Medicine

## 2013-05-17 ENCOUNTER — Ambulatory Visit: Payer: Medicare Other | Admitting: Internal Medicine

## 2013-05-26 ENCOUNTER — Other Ambulatory Visit: Payer: Self-pay | Admitting: *Deleted

## 2013-05-26 MED ORDER — GABAPENTIN 300 MG PO CAPS: 300.0000 mg | ORAL_CAPSULE | Freq: Two times a day (BID) | ORAL | Status: DC

## 2013-06-03 ENCOUNTER — Other Ambulatory Visit: Payer: Self-pay | Admitting: Internal Medicine

## 2013-06-25 ENCOUNTER — Ambulatory Visit (INDEPENDENT_AMBULATORY_CARE_PROVIDER_SITE_OTHER): Payer: Medicare Other | Admitting: Internal Medicine

## 2013-06-25 ENCOUNTER — Encounter: Payer: Self-pay | Admitting: Internal Medicine

## 2013-06-25 VITALS — BP 140/60 | HR 80 | Temp 98.3°F | Resp 16 | Ht 65.0 in | Wt 178.0 lb

## 2013-06-25 DIAGNOSIS — K219 Gastro-esophageal reflux disease without esophagitis: Secondary | ICD-10-CM

## 2013-06-25 DIAGNOSIS — I10 Essential (primary) hypertension: Secondary | ICD-10-CM

## 2013-06-25 DIAGNOSIS — R7309 Other abnormal glucose: Secondary | ICD-10-CM

## 2013-06-25 MED ORDER — POTASSIUM CHLORIDE CRYS ER 20 MEQ PO TBCR: 20.0000 meq | EXTENDED_RELEASE_TABLET | Freq: Two times a day (BID) | ORAL | Status: DC

## 2013-06-25 MED ORDER — GABAPENTIN 300 MG PO CAPS: 300.0000 mg | ORAL_CAPSULE | Freq: Two times a day (BID) | ORAL | Status: DC

## 2013-06-25 MED ORDER — HYDROCHLOROTHIAZIDE 12.5 MG PO CAPS: 12.5000 mg | ORAL_CAPSULE | Freq: Every day | ORAL | Status: DC

## 2013-06-25 MED ORDER — ESOMEPRAZOLE MAGNESIUM 40 MG PO CPDR: 40.0000 mg | DELAYED_RELEASE_CAPSULE | Freq: Every day | ORAL | Status: DC

## 2013-06-25 NOTE — Progress Notes (Signed)
Pre visit review using our clinic review tool, if applicable. No additional management support is needed unless otherwise documented below in the visit note. 

## 2013-06-25 NOTE — Progress Notes (Signed)
Subjective:    Patient ID: [name], female    DOB: 08/29/1930, 80 y.o.   MRN: [id number]  HPI Patient is an 80 year old female who presents for followup of hypertension gastroesophageal reflux with chronic cough and a history of TIAs Stable blood pressure Breathing stable Needed 90 day RX    Review of Systems  Constitutional: Negative.   HENT: Negative.   Eyes: Negative.   Respiratory: Positive for cough and choking.   Endocrine: Negative.   Genitourinary: Positive for urgency and frequency.  Neurological: Negative.   Psychiatric/Behavioral: Negative.    Past Medical History  Diagnosis Date  . PUD (peptic ulcer disease)   . Unspecified essential hypertension   . Esophageal reflux   . Anemia   . Anxiety   . Arthritis   . Blood transfusion   . Cataract     surgery 2008  . Stress incontinence   . Esophageal stricture   . H. pylori infection   . Hiatal hernia   . Renal cyst   . Diverticulosis   . Esophageal dysmotility     History   Social History  . Marital Status: Single    Spouse Name: N/A    Number of Children: N/A  . Years of Education: N/A   Occupational History  . Glass blower/designercashier [employer]   Social History Main Topics  . Smoking status: Former Smoker    Quit date: 06/24/1990  . Smokeless tobacco: Never Used     Comment: quit 1985  . Alcohol Use: No  . Drug Use: No  . Sexual Activity: Not Currently   Other Topics Concern  . Not on file   Social History Narrative  . No narrative on file    Past Surgical History  Procedure Laterality Date  . Abdominal hysterectomy    . Rotator cuff repair    . Oophorectomy    . Cataract extraction, bilateral    . Right knee replacement    . Knee arthroplasty  11/01/2011    Procedure: COMPUTER ASSISTED TOTAL KNEE ARTHROPLASTY;  Surgeon: Harvie JuniorJohn L Graves, MD;  Location: [facility] OR;  Service: Orthopedics;  Laterality: Left;  GENERAL ANESTHESIA WITH PRE OP FEMORAL NERVE BLOCK    Family History  Problem Relation Age of  Onset  . Diabetes Mother   . Colon cancer Neg Hx     Allergies  Allergen Reactions  . Nsaids Other (See Comments)    REACTION: intolerant due to reflux, history of ulcers    Current Outpatient Prescriptions on File Prior to Visit  Medication Sig Dispense Refill  . ALPRAZolam (XANAX) 0.5 MG tablet TAKE 1 TABLET AT BEDTIME AS NEEDED  30 tablet  3  . aluminum chloride (HYPERCARE) 20 % external solution Apply 1 application topically daily. Apply under arms      . AZOR 5-40 MG per tablet TAKE 1 TABLET BY MOUTH DAILY.  30 tablet  6  . ferrous sulfate 325 (65 FE) MG tablet Take 325 mg by mouth 2 (two) times daily.      . fesoterodine (TOVIAZ) 8 MG TB24 tablet Take 8 mg by mouth every evening.      . fexofenadine (ALLEGRA) 180 MG tablet Take 1 tablet (180 mg total) by mouth daily.  30 tablet  5  . HYDROcodone-acetaminophen (NORCO/VICODIN) 5-325 MG per tablet Take 1 tablet by mouth 2 (two) times daily as needed for pain. For pain      . Multiple Vitamin (MULTIVITAMIN WITH MINERALS) TABS Take 1 tablet by mouth  every morning.       . prednisoLONE acetate (PRED FORTE) 1 % ophthalmic suspension Place 1 drop into both eyes 2 (two) times daily as needed. For eye irritation      . senna (SENOKOT) 8.6 MG tablet Take 2 tablets by mouth 2 (two) times daily.      . sucralfate (CARAFATE) 1 G tablet TAKE 1 TABLET (1 GRAM) 4 TIMES A DAY (BETWEEN MEALS AND AT BEDTIME)  120 tablet  0  . traMADol (ULTRAM) 50 MG tablet TAKE 2 TABLETS BY MOUTH EVERY 6 HOURS AS NEEDED FOR MILD ARTHRITIS PAIN  120 tablet  4  . [DISCONTINUED] fesoterodine (TOVIAZ) 8 MG TB24 Take 1 tablet (8 mg total) by mouth daily.  30 tablet  6   No current facility-administered medications on file prior to visit.    BP 140/60  Pulse 80  Temp(Src) 98.3 F (36.8 C)  Resp 16  Ht 5\' 5"  (1.651 m)  Wt 178 lb (80.74 kg)  BMI 29.62 kg/m2       Objective:   Physical Exam  Constitutional: She is oriented to person, place, and time. She appears  well-developed and well-nourished. No distress.  HENT:  Head: Normocephalic and atraumatic.  Eyes: Conjunctivae and EOM are normal. Pupils are equal, round, and reactive to light.  Neck: Normal range of motion. Neck supple. No JVD present. No tracheal deviation present. No thyromegaly present.  Cardiovascular: Normal rate and regular rhythm.   Murmur heard. Pulmonary/Chest: Effort normal and breath sounds normal. She has no wheezes. She exhibits no tenderness.  Abdominal: Soft. Bowel sounds are normal.  Musculoskeletal: Normal range of motion. She exhibits no edema and no tenderness.  Lymphadenopathy:    She has no cervical adenopathy.  Neurological: She is alert and oriented to person, place, and time. She has normal reflexes. No cranial nerve deficit.  Skin: Skin is warm and dry. She is not diaphoretic.  Psychiatric: She has a normal mood and affect. Her behavior is normal.          Assessment & Plan:  2 hypertension.  Refill medications for 90 day supply.  Samples of Toviaz Nexium and Azor

## 2013-08-14 ENCOUNTER — Other Ambulatory Visit: Payer: Self-pay | Admitting: Internal Medicine

## 2013-09-13 ENCOUNTER — Ambulatory Visit: Payer: Medicare Other | Admitting: Internal Medicine

## 2013-09-22 ENCOUNTER — Other Ambulatory Visit: Payer: Self-pay | Admitting: Internal Medicine

## 2013-09-30 ENCOUNTER — Other Ambulatory Visit: Payer: Self-pay | Admitting: Internal Medicine

## 2013-10-05 ENCOUNTER — Other Ambulatory Visit: Payer: Self-pay | Admitting: Internal Medicine

## 2013-10-07 ENCOUNTER — Other Ambulatory Visit: Payer: Self-pay | Admitting: Internal Medicine

## 2013-10-11 ENCOUNTER — Other Ambulatory Visit: Payer: Self-pay | Admitting: Family

## 2013-11-02 ENCOUNTER — Telehealth: Payer: Self-pay | Admitting: Internal Medicine

## 2013-11-02 NOTE — Telephone Encounter (Signed)
Patient Information:  Caller Name: [name]  Phone: [phone]  Patient: [name], [name]  Gender: Female  DOB: 06/05/1931  Age: 80 Years  PCP: Darryll CapersJenkins, John (Adults only)  Office Follow Up:  Does the office need to follow up with this patient?: No  Instructions For The Office: N/A  RN Note:  Pt agrees that she missed dose today and will take now.  She will monitor BP today for low numbers.  She will call back with any low BP or changes in condition.  She agrees to not travel or drive today  Symptoms  Reason For Call & Symptoms: Pt reports that she found her BP med in her bed and is unsure if she should take the medication or wait till WED.  Currently her BP is 151/87  HR 95.  Pt normally takes the  Medication at 08:30.   Pt is pretty sure that she missed med this am.  She wants to go ahead and take med.  Pt does not have any plans for today.  She will check her BP today and call back if it is low.  She also states that she has no plans to drive today  Reviewed Health History In EMR: No  Reviewed Medications In EMR: No  Reviewed Allergies In EMR: No  Reviewed Surgeries / Procedures: No  Date of Onset of Symptoms: 11/02/2013  Guideline(s) Used:  No Protocol Available - Information Only  Disposition Per Guideline:   Home Care  Reason For Disposition Reached:   Information only question and nurse able to answer  Advice Given:  N/A  Patient Will Follow Care Advice:  YES

## 2013-11-08 ENCOUNTER — Other Ambulatory Visit: Payer: Self-pay | Admitting: Internal Medicine

## 2013-12-06 ENCOUNTER — Other Ambulatory Visit: Payer: Self-pay | Admitting: Internal Medicine

## 2013-12-11 ENCOUNTER — Other Ambulatory Visit: Payer: Self-pay | Admitting: Internal Medicine

## 2013-12-27 ENCOUNTER — Ambulatory Visit: Payer: Medicare Other | Admitting: Internal Medicine

## 2013-12-28 ENCOUNTER — Ambulatory Visit (INDEPENDENT_AMBULATORY_CARE_PROVIDER_SITE_OTHER): Payer: Medicare Other | Admitting: Family

## 2013-12-28 ENCOUNTER — Encounter: Payer: Self-pay | Admitting: Family

## 2013-12-28 ENCOUNTER — Telehealth: Payer: Self-pay | Admitting: Family

## 2013-12-28 VITALS — BP 112/62 | Temp 98.6°F | Ht 65.0 in | Wt 176.0 lb

## 2013-12-28 DIAGNOSIS — K219 Gastro-esophageal reflux disease without esophagitis: Secondary | ICD-10-CM

## 2013-12-28 DIAGNOSIS — I10 Essential (primary) hypertension: Secondary | ICD-10-CM

## 2013-12-28 LAB — BASIC METABOLIC PANEL
BUN: 17 mg/dL (ref 6–23)
CO2: 26 meq/L (ref 19–32)
Calcium: 10.1 mg/dL (ref 8.4–10.5)
Chloride: 99 mEq/L (ref 96–112)
Creatinine, Ser: 1.3 mg/dL — ABNORMAL HIGH (ref 0.4–1.2)
GFR: 49.72 mL/min — ABNORMAL LOW (ref >60.00)
GLUCOSE: 103 mg/dL — AB (ref 70–99)
POTASSIUM: 4 meq/L (ref 3.5–5.1)
SODIUM: 135 meq/L (ref 135–145)

## 2013-12-28 NOTE — Patient Instructions (Signed)
Hypertension Hypertension, commonly called high blood pressure, is when the force of blood pumping through your arteries is too strong. Your arteries are the blood vessels that carry blood from your heart throughout your body. A blood pressure reading consists of a higher number over a lower number, such as 110/72. The higher number (systolic) is the pressure inside your arteries when your heart pumps. The lower number (diastolic) is the pressure inside your arteries when your heart relaxes. Ideally you want your blood pressure below 120/80. Hypertension forces your heart to work harder to pump blood. Your arteries may become narrow or stiff. Having hypertension puts you at risk for heart disease, stroke, and other problems.  RISK FACTORS Some risk factors for high blood pressure are controllable. Others are not.  Risk factors you cannot control include:   Race. You may be at higher risk if you are African American.  Age. Risk increases with age.  Gender. Men are at higher risk than women before age 45 years. After age 65, women are at higher risk than men. Risk factors you can control include:  Not getting enough exercise or physical activity.  Being overweight.  Getting too much fat, sugar, calories, or salt in your diet.  Drinking too much alcohol. SIGNS AND SYMPTOMS Hypertension does not usually cause signs or symptoms. Extremely high blood pressure (hypertensive crisis) may cause headache, anxiety, shortness of breath, and nosebleed. DIAGNOSIS  To check if you have hypertension, your health care provider will measure your blood pressure while you are seated, with your arm held at the level of your heart. It should be measured at least twice using the same arm. Certain conditions can cause a difference in blood pressure between your right and left arms. A blood pressure reading that is higher than normal on one occasion does not mean that you need treatment. If one blood pressure reading  is high, ask your health care provider about having it checked again. TREATMENT  Treating high blood pressure includes making lifestyle changes and possibly taking medication. Living a healthy lifestyle can help lower high blood pressure. You may need to change some of your habits. Lifestyle changes may include:  Following the DASH diet. This diet is high in fruits, vegetables, and whole grains. It is low in salt, red meat, and added sugars.  Getting at least 2 1/2 hours of brisk physical activity every week.  Losing weight if necessary.  Not smoking.  Limiting alcoholic beverages.  Learning ways to reduce stress. If lifestyle changes are not enough to get your blood pressure under control, your health care provider may prescribe medicine. You may need to take more than one. Work closely with your health care provider to understand the risks and benefits. HOME CARE INSTRUCTIONS  Have your blood pressure rechecked as directed by your health care provider.   Only take medicine as directed by your health care provider. Follow the directions carefully. Blood pressure medicines must be taken as prescribed. The medicine does not work as well when you skip doses. Skipping doses also puts you at risk for problems.   Do not smoke.   Monitor your blood pressure at home as directed by your health care provider. SEEK MEDICAL CARE IF:   You think you are having a reaction to medicines taken.  You have recurrent headaches or feel dizzy.  You have swelling in your ankles.  You have trouble with your vision. SEEK IMMEDIATE MEDICAL CARE IF:  You develop a severe headache or   confusion.  You have unusual weakness, numbness, or feel faint.  You have severe chest or abdominal pain.  You vomit repeatedly.  You have trouble breathing. MAKE SURE YOU:   Understand these instructions.  Will watch your condition.  Will get help right away if you are not doing well or get  worse. Document Released: 06/10/2005 Document Revised: 06/15/2013 Document Reviewed: 04/02/2013 ExitCare Patient Information 2015 [employer]. This information is not intended to replace advice given to you by your health care provider. Make sure you discuss any questions you have with your health care provider.  

## 2013-12-28 NOTE — Telephone Encounter (Signed)
Relevant patient education assigned to patient using Emmi. ° °

## 2013-12-28 NOTE — Progress Notes (Signed)
Pre visit review using our clinic review tool, if applicable. No additional management support is needed unless otherwise documented below in the visit note. 

## 2013-12-28 NOTE — Progress Notes (Signed)
Subjective:    Patient ID: [name], female    DOB: 09/19/1930, 80 y.o.   MRN: [id number]  HPI 80 y.o. AA female presents today for a hypertension follow up. Pt was started on Azor 5-40 and hydrochlorothiazide in January. Reports that she does not take blood pressures regularly at home. Denies headaches, dizziness, change in vision, SOB and chest pain. Pt also has chief complaint of feeling like "i have wax in my ears". Pt states that she occasionally uses drops to clean her ears out. Denies dizziness, denies congestion. Denies fever, fatigue, chills and change in appetite.     Review of Systems  Constitutional: Negative.   HENT: Negative.   Eyes: Negative.   Respiratory: Negative.   Cardiovascular: Negative.   Gastrointestinal: Positive for constipation.       Acknowledges chronic constipation.   Endocrine: Negative.   Musculoskeletal: Negative.   Skin: Negative.   Allergic/Immunologic: Negative.   Neurological: Negative.   Hematological: Negative.   Psychiatric/Behavioral: Negative.    Past Medical History  Diagnosis Date  . PUD (peptic ulcer disease)   . Unspecified essential hypertension   . Esophageal reflux   . Anemia   . Anxiety   . Arthritis   . Blood transfusion   . Cataract     surgery 2008  . Stress incontinence   . Esophageal stricture   . H. pylori infection   . Hiatal hernia   . Renal cyst   . Diverticulosis   . Esophageal dysmotility     History   Social History  . Marital Status: Single    Spouse Name: N/A    Number of Children: N/A  . Years of Education: N/A   Occupational History  . Glass blower/designercashier [employer]   Social History Main Topics  . Smoking status: Former Smoker    Quit date: 06/24/1990  . Smokeless tobacco: Never Used     Comment: quit 1985  . Alcohol Use: No  . Drug Use: No  . Sexual Activity: Not Currently   Other Topics Concern  . Not on file   Social History Narrative  . No narrative on file    Past Surgical History    Procedure Laterality Date  . Abdominal hysterectomy    . Rotator cuff repair    . Oophorectomy    . Cataract extraction, bilateral    . Right knee replacement    . Knee arthroplasty  11/01/2011    Procedure: COMPUTER ASSISTED TOTAL KNEE ARTHROPLASTY;  Surgeon: Harvie JuniorJohn L Graves, MD;  Location: [facility] OR;  Service: Orthopedics;  Laterality: Left;  GENERAL ANESTHESIA WITH PRE OP FEMORAL NERVE BLOCK    Family History  Problem Relation Age of Onset  . Diabetes Mother   . Colon cancer Neg Hx     Allergies  Allergen Reactions  . Nsaids Other (See Comments)    REACTION: intolerant due to reflux, history of ulcers    Current Outpatient Prescriptions on File Prior to Visit  Medication Sig Dispense Refill  . ALPRAZolam (XANAX) 0.5 MG tablet TAKE 1 TABLET AT BEDTIME AS NEEDED  30 tablet  3  . aluminum chloride (HYPERCARE) 20 % external solution Apply 1 application topically daily. Apply under arms      . AZOR 5-40 MG per tablet TAKE 1 TABLET BY MOUTH DAILY.  30 tablet  11  . DULoxetine (CYMBALTA) 60 MG capsule TAKE 1 CAPSULE EVERY DAY  90 capsule  0  . esomeprazole (NEXIUM) 40 MG capsule Take 1  capsule (40 mg total) by mouth daily at 12 noon.  90 capsule  3  . ferrous sulfate 325 (65 FE) MG tablet Take 325 mg by mouth 2 (two) times daily.      . fexofenadine (ALLEGRA) 180 MG tablet Take 1 tablet (180 mg total) by mouth daily.  30 tablet  5  . gabapentin (NEURONTIN) 300 MG capsule TAKE 1 CAPSULE (300 MG TOTAL) BY MOUTH 2 (TWO) TIMES DAILY.  60 capsule  3  . hydrochlorothiazide (MICROZIDE) 12.5 MG capsule Take 1 capsule (12.5 mg total) by mouth daily.  90 capsule  3  . HYDROcodone-acetaminophen (NORCO/VICODIN) 5-325 MG per tablet Take 1 tablet by mouth 2 (two) times daily as needed for pain. For pain      . KLOR-CON M20 20 MEQ tablet 1 TABLET BY MOUTH DAILY  90 tablet  3  . Multiple Vitamin (MULTIVITAMIN WITH MINERALS) TABS Take 1 tablet by mouth every morning.       . prednisoLONE acetate (PRED  FORTE) 1 % ophthalmic suspension Place 1 drop into both eyes 2 (two) times daily as needed. For eye irritation      . prednisoLONE acetate (PRED FORTE) 1 % ophthalmic suspension PLACE 1 DROP INTO BOTH EYES 4 (FOUR) TIMES DAILY.  10 mL  0  . senna (SENOKOT) 8.6 MG tablet Take 2 tablets by mouth 2 (two) times daily.      . sucralfate (CARAFATE) 1 G tablet TAKE 1 TABLET (1 GRAM) 4 TIMES A DAY (BETWEEN MEALS AND AT BEDTIME)  120 tablet  0  . TOVIAZ 8 MG TB24 tablet TAKE 1 TABLET EVERY DAY  90 tablet  3  . traMADol (ULTRAM) 50 MG tablet TAKE 2 TABLETS EVERY 6 HOURS AS NEEDED FOR PAIN  120 tablet  4  . [DISCONTINUED] fesoterodine (TOVIAZ) 8 MG TB24 Take 1 tablet (8 mg total) by mouth daily.  30 tablet  6   No current facility-administered medications on file prior to visit.    BP 112/62  Temp(Src) 98.6 F (37 C) (Oral)  Ht 5\' 5"  (1.651 m)  Wt 176 lb (79.833 kg)  BMI 29.29 kg/m2chart    Objective:   Physical Exam  Constitutional: She is oriented to person, place, and time. She appears well-developed and well-nourished. She is active.  HENT:  Head: Normocephalic.  Right Ear: Tympanic membrane, external ear and ear canal normal.  Left Ear: Tympanic membrane, external ear and ear canal normal.  Cardiovascular: Normal rate, regular rhythm and normal pulses.   Murmur heard. Pulmonary/Chest: Effort normal and breath sounds normal.  Abdominal: Soft. Normal appearance and bowel sounds are normal.  Neurological: She is alert and oriented to person, place, and time.  Skin: Skin is warm, dry and intact.  Psychiatric: She has a normal mood and affect. Her speech is normal and behavior is normal. Judgment and thought content normal. Cognition and memory are normal.          Assessment & Plan:  [name] was seen today for follow-up.  Diagnoses and associated orders for this visit:  Unspecified essential hypertension - Basic Metabolic Panel  Gastroesophageal reflux disease without  esophagitis   Education: Exercise at least 30 minutes, 5x per week. Eat healthy diet low in salt, saturated fats. Medication discussed in detail with patient.   Follow up: 6 months.

## 2014-01-02 ENCOUNTER — Other Ambulatory Visit: Payer: Self-pay | Admitting: Internal Medicine

## 2014-01-07 ENCOUNTER — Telehealth: Payer: Self-pay | Admitting: Internal Medicine

## 2014-01-07 NOTE — Telephone Encounter (Signed)
Spoke with the patient. She has had a cough for 2 weeks. She is not experiencing any SOB or dyspnea. Afebrile.She has called her PCP and was advised to come be seen. She is taking daily Hydrocodone. Encouraged the patient to follow her PCP recommendations or go to the ER if she acutely worsens over the weekend.

## 2014-01-10 ENCOUNTER — Ambulatory Visit (INDEPENDENT_AMBULATORY_CARE_PROVIDER_SITE_OTHER): Payer: Medicare Other | Admitting: Family Medicine

## 2014-01-10 ENCOUNTER — Encounter: Payer: Self-pay | Admitting: Family Medicine

## 2014-01-10 ENCOUNTER — Telehealth: Payer: Self-pay | Admitting: Family

## 2014-01-10 VITALS — BP 141/75 | HR 87 | Temp 98.1°F | Ht 65.0 in | Wt 175.0 lb

## 2014-01-10 DIAGNOSIS — J209 Acute bronchitis, unspecified: Secondary | ICD-10-CM

## 2014-01-10 MED ORDER — AZITHROMYCIN 250 MG PO TABS: ORAL_TABLET | ORAL | Status: DC

## 2014-01-10 NOTE — Progress Notes (Signed)
   Subjective:    Patient ID: [name], female    DOB: 05/29/1931, 80 y.o.   MRN: [id number]  HPI Here for 3 weeks of chest congestion and coughing up green sputum. No fever or chest pain.    Review of Systems  Constitutional: Negative.   HENT: Negative.   Eyes: Negative.   Respiratory: Positive for cough and chest tightness. Negative for shortness of breath and wheezing.   Cardiovascular: Negative.        Objective:   Physical Exam  Constitutional: She appears well-developed and well-nourished.  HENT:  Right Ear: External ear normal.  Left Ear: External ear normal.  Nose: Nose normal.  Mouth/Throat: Oropharynx is clear and moist.  Eyes: Conjunctivae are normal.  Cardiovascular: Normal rate, regular rhythm, normal heart sounds and intact distal pulses.   Pulmonary/Chest: Effort normal. No respiratory distress. She has no wheezes. She has no rales.  Scattered rhonchi that clears upon coughing   Lymphadenopathy:    She has no cervical adenopathy.          Assessment & Plan:  Add Mucinex

## 2014-01-10 NOTE — Progress Notes (Signed)
Pre visit review using our clinic review tool, if applicable. No additional management support is needed unless otherwise documented below in the visit note. 

## 2014-01-10 NOTE — Telephone Encounter (Signed)
error 

## 2014-01-17 ENCOUNTER — Telehealth: Payer: Self-pay | Admitting: Family

## 2014-01-17 NOTE — Telephone Encounter (Signed)
Pt saw dr fry on 01-10-14 for bronchitis and would like another round of zpak call into [facility]. Pt is still have same symptoms but not as bad. Pt still has cough at night

## 2014-01-17 NOTE — Telephone Encounter (Signed)
Please advise 

## 2014-01-17 NOTE — Telephone Encounter (Signed)
Dr. Clent RidgesFry,  Please make recommendation based upon what you believe.

## 2014-01-18 MED ORDER — AZITHROMYCIN 250 MG PO TABS: ORAL_TABLET | ORAL | Status: DC

## 2014-01-18 NOTE — Telephone Encounter (Signed)
Call in another Zpack  

## 2014-01-18 NOTE — Telephone Encounter (Signed)
I spoke with pt and sent script e-scribe. 

## 2014-02-08 ENCOUNTER — Telehealth: Payer: Self-pay

## 2014-02-08 NOTE — Telephone Encounter (Signed)
Pt states that she gets her pain medication through her orthopaedic doctor for her arthritis so he will be continuing to manage that and she is willing to discuss other sleep medication options at her 02/23/14 OV

## 2014-02-14 ENCOUNTER — Other Ambulatory Visit: Payer: Self-pay | Admitting: Internal Medicine

## 2014-02-14 NOTE — Telephone Encounter (Signed)
Pt never established with [facility] and Xanax was never filled by [facility]. She is scheduled to establish with Dr. Durene Cal on 02/23/14. Sent to [facility]

## 2014-02-15 ENCOUNTER — Other Ambulatory Visit: Payer: Self-pay | Admitting: Internal Medicine

## 2014-02-16 NOTE — Telephone Encounter (Signed)
Is this ok to refill?  

## 2014-02-19 ENCOUNTER — Other Ambulatory Visit: Payer: Self-pay | Admitting: Family

## 2014-02-23 ENCOUNTER — Encounter: Payer: Self-pay | Admitting: Family Medicine

## 2014-02-23 ENCOUNTER — Ambulatory Visit (INDEPENDENT_AMBULATORY_CARE_PROVIDER_SITE_OTHER): Payer: PRIVATE HEALTH INSURANCE | Admitting: Family Medicine

## 2014-02-23 VITALS — BP 143/75 | HR 90 | Temp 98.3°F | Wt 175.0 lb

## 2014-02-23 DIAGNOSIS — F329 Major depressive disorder, single episode, unspecified: Secondary | ICD-10-CM

## 2014-02-23 DIAGNOSIS — G4700 Insomnia, unspecified: Secondary | ICD-10-CM

## 2014-02-23 DIAGNOSIS — F3289 Other specified depressive episodes: Secondary | ICD-10-CM

## 2014-02-23 DIAGNOSIS — I10 Essential (primary) hypertension: Secondary | ICD-10-CM

## 2014-02-23 DIAGNOSIS — F32A Depression, unspecified: Secondary | ICD-10-CM | POA: Insufficient documentation

## 2014-02-23 MED ORDER — ALPRAZOLAM 0.5 MG PO TABS: ORAL_TABLET | ORAL | Status: DC

## 2014-02-23 NOTE — Assessment & Plan Note (Signed)
At goal given age. Continue azor and hydrochlorothiazide

## 2014-02-23 NOTE — Progress Notes (Signed)
Anna Conch, MD Phone: [phone]  Subjective:  Patient presents today to establish care with me as their new primary care provider. Patient was formerly a patient of Dr. Lovell Sheehan. Chief complaint-noted.   Insomnia Has done well for years on Xanax 0.5 mg. Sometimes takes a half pill ROS-describes no difficulty driving the next day. No hallucinations. No effusion  Depression Has done well on Cymbalta ROS-no SI HI  Hypertension BP Readings from Last 3 Encounters:  02/23/14 143/75  01/10/14 141/75  12/28/13 112/62  Home BP monitoring-no Compliant with medications-yes without side effects ROS-Denies any CP, HA, SOB.   The following were reviewed and entered/updated in epic: Past Medical History  Diagnosis Date  . PUD (peptic ulcer disease)   . Unspecified essential hypertension   . Esophageal reflux   . Anemia   . Anxiety   . Arthritis   . Blood transfusion   . Cataract     surgery 2008  . Stress incontinence   . Esophageal stricture   . H. pylori infection   . Hiatal hernia   . Renal cyst   . Diverticulosis   . Esophageal dysmotility   . DIVERTICULOSIS OF COLON 03/10/2008    Qualifier: Diagnosis of  By: Nelson-Smith CMA (AAMA), Dottie    . RENAL CYST 03/01/2010    Stable over course of one year. No further followup     Patient Active Problem List   Diagnosis Date Noted  . Depression 02/23/2014    Priority: Medium  . TIA ? 03/07/2013    Priority: Medium  . OSTEOARTHRITIS 02/21/2010    Priority: Medium  . INSOMNIA 06/29/2008    Priority: Medium  . ESOPHAGEAL MOTILITY DISORDER 01/07/2008    Priority: Medium  . HYPERGLYCEMIA, BORDERLINE 05/05/2007    Priority: Medium  . HYPERTENSION 12/17/2006    Priority: Medium  . ESOPHAGEAL STRICTURE 12/17/2006    Priority: Medium  . Asthma, chronic 08/17/2010    Priority: Low  . Polyuria 07/21/2009    Priority: Low  . IRON DEFICIENCY ANEMIA SECONDARY TO BLOOD LOSS 09/16/2008    Priority: Low  . HIATAL HERNIA  03/10/2008    Priority: Low  . LOW BACK PAIN 02/03/2008    Priority: Low  . CONSTIPATION, CHRONIC 09/10/2007    Priority: Low  . COUGH, CHRONIC 03/12/2007    Priority: Low  . GERD 12/17/2006    Priority: Low   Past Surgical History  Procedure Laterality Date  . Abdominal hysterectomy    . Rotator cuff repair      right side, left-unknown repair  . Oophorectomy    . Cataract extraction, bilateral    . Right knee replacement    . Knee arthroplasty  11/01/2011    Procedure: COMPUTER ASSISTED TOTAL KNEE ARTHROPLASTY;  Surgeon: Harvie Junior, MD;  Location: [facility] OR;  Service: Orthopedics;  Laterality: Left;  GENERAL ANESTHESIA WITH PRE OP FEMORAL NERVE BLOCK    Family History  Problem Relation Age of Onset  . Diabetes Mother     aunts  . Colon cancer Neg Hx     Medications- reviewed and updated Current Outpatient Prescriptions  Medication Sig Dispense Refill  . aluminum chloride (HYPERCARE) 20 % external solution Apply 1 application topically daily. Apply under arms      . AZOR 5-40 MG per tablet TAKE 1 TABLET BY MOUTH DAILY.  30 tablet  11  . co-enzyme Q-10 30 MG capsule Take 30 mg by mouth daily.      . cyanocobalamin 500 MCG  tablet Take 500 mcg by mouth daily.      . DULoxetine (CYMBALTA) 60 MG capsule TAKE 1 CAPSULE EVERY DAY  90 capsule  0  . esomeprazole (NEXIUM) 40 MG capsule Take 1 capsule (40 mg total) by mouth daily at 12 noon.  90 capsule  3  . ferrous sulfate 325 (65 FE) MG tablet Take 325 mg by mouth 2 (two) times daily.      Marland Kitchen gabapentin (NEURONTIN) 300 MG capsule TAKE 1 CAPSULE (300 MG TOTAL) BY MOUTH 2 (TWO) TIMES DAILY.  60 capsule  3  . hydrochlorothiazide (MICROZIDE) 12.5 MG capsule Take 1 capsule (12.5 mg total) by mouth daily.  90 capsule  3  . KLOR-CON M20 20 MEQ tablet 1 TABLET BY MOUTH DAILY  90 tablet  3  . Multiple Vitamin (MULTIVITAMIN WITH MINERALS) TABS Take 1 tablet by mouth every morning.       . prednisoLONE acetate (PRED FORTE) 1 % ophthalmic  suspension Place 1 drop into both eyes 2 (two) times daily as needed. For eye irritation      . sucralfate (CARAFATE) 1 G tablet TAKE 1 TABLET (1 GRAM) 4 TIMES A DAY (BETWEEN MEALS AND AT BEDTIME)  120 tablet  0  . TOVIAZ 8 MG TB24 tablet TAKE 1 TABLET EVERY DAY  90 tablet  3  . ALPRAZolam (XANAX) 0.5 MG tablet TAKE 1/2-1 TABLET AT BEDTIME AS NEEDED  30 tablet  3  . fexofenadine (ALLEGRA) 180 MG tablet TAKE 1 TABLET (180 MG TOTAL) BY MOUTH DAILY.  30 tablet  5  . HYDROcodone-acetaminophen (NORCO/VICODIN) 5-325 MG per tablet Take 1 tablet by mouth 2 (two) times daily as needed for pain. For pain      . senna (SENOKOT) 8.6 MG tablet Take 2 tablets by mouth 2 (two) times daily.      . traMADol (ULTRAM) 50 MG tablet TAKE 2 TABLETS EVERY 6 HOURS AS NEEDED FOR PAIN  120 tablet  4  . [DISCONTINUED] fesoterodine (TOVIAZ) 8 MG TB24 Take 1 tablet (8 mg total) by mouth daily.  30 tablet  6   No current facility-administered medications for this visit.    Allergies-reviewed and updated Allergies  Allergen Reactions  . Nsaids Other (See Comments)    REACTION: intolerant due to reflux, history of ulcers    History   Social History  . Marital Status: Single    Spouse Name: N/A    Number of Children: N/A  . Years of Education: N/A   Occupational History  . Glass blower/designer   Social History Main Topics  . Smoking status: Former Smoker -- 0.10 packs/day for 40 years    Types: Cigarettes    Quit date: 06/24/1990  . Smokeless tobacco: Never Used     Comment: quit 1985  . Alcohol Use: No  . Drug Use: No  . Sexual Activity: Not Currently   Other Topics Concern  . None   Social History Narrative   Lives with daughter and greatgrandaughter (going to [employer] nursing), formerly divorced (husband now passed). 3 children (son passed from drugs).       Retired from [employer]: read, tv      Independent primarily-occasionally needs help lifting heavy things  due to shoulder pain/arthritis    ROS--See HPI   Objective: BP 143/75  Pulse 90  Temp(Src) 98.3 F (36.8 C)  Wt 175 lb (79.379 kg) Gen: NAD, resting comfortably, appears stated age HEENT: Mucous  membranes are moist. Oropharynx normal CV: RRR no murmurs rubs or gallops Lungs: CTAB no crackles, wheeze, rhonchi Abdomen: soft/nontender/nondistended/normal bowel sounds.  Ext: no edema Skin: warm, dry, no rash  Assessment/Plan:  INSOMNIA Has done very well on Xanax. Asked patient to attempt to minimize by using only half tab on a regular basis.  Depression Continue Cymbalta. Doing well  HYPERTENSION At goal given age. Continue azor and hydrochlorothiazide   Meds ordered this encounter  Medications  . ALPRAZolam (XANAX) 0.5 MG tablet    Sig: TAKE 1/2-1 TABLET AT BEDTIME AS NEEDED    Dispense:  30 tablet    Refill:  3

## 2014-02-23 NOTE — Patient Instructions (Signed)
Insomnia  Refilled Xanax  Discussed trying 1/2 tab daily  Discussed risks including falls/confusion/dreams/trouble driving next day  Alternatives available if you ever have any issues  See me back in 3 months and we will do bloodwork at that time  If you were to have discomfort persistent after your tums or nexium, you need to seek care.

## 2014-02-23 NOTE — Assessment & Plan Note (Signed)
Continue Cymbalta. Doing well

## 2014-02-23 NOTE — Assessment & Plan Note (Signed)
Has done very well on Xanax. Asked patient to attempt to minimize by using only half tab on a regular basis.

## 2014-03-08 ENCOUNTER — Other Ambulatory Visit: Payer: Self-pay | Admitting: Internal Medicine

## 2014-03-17 ENCOUNTER — Telehealth: Payer: Self-pay | Admitting: Family Medicine

## 2014-03-17 NOTE — Telephone Encounter (Signed)
I would recommend patient call Dr. Luiz Blare office. They have someone in their office that does injections in the back if needed.   If she would prefer, she can come see me so I can evaluate her current symptoms and potentially set up repeat injections through radiology.

## 2014-03-17 NOTE — Telephone Encounter (Signed)
Pt.notified

## 2014-03-17 NOTE — Telephone Encounter (Signed)
Dr. Hunter please see below 

## 2014-03-17 NOTE — Telephone Encounter (Signed)
Pt needs referral for an injection in her back at [facility]. Pt has sciatica pain.  Pt states dr Lovell Sheehan used to set this up for her, but its been about 2 yrs. pls advise.

## 2014-04-05 ENCOUNTER — Telehealth: Payer: Self-pay | Admitting: Family Medicine

## 2014-04-05 NOTE — Telephone Encounter (Signed)
[id number] - [place], [place] - [address] is requesting 90 day re-fill on gabapentin (NEURONTIN) 300 MG capsule

## 2014-04-05 NOTE — Telephone Encounter (Signed)
Please add 90 day re-fill on Azor 5-40 to the below re-fill request.

## 2014-04-06 MED ORDER — AMLODIPINE-OLMESARTAN 5-40 MG PO TABS: 1.0000 | ORAL_TABLET | Freq: Every day | ORAL | Status: DC

## 2014-04-06 MED ORDER — GABAPENTIN 300 MG PO CAPS: 300.0000 mg | ORAL_CAPSULE | Freq: Two times a day (BID) | ORAL | Status: DC

## 2014-04-06 NOTE — Telephone Encounter (Signed)
Medications refilled

## 2014-04-20 ENCOUNTER — Telehealth: Payer: Self-pay | Admitting: Family Medicine

## 2014-04-20 MED ORDER — PREDNISOLONE ACETATE 1 % OP SUSP: 1.0000 [drp] | Freq: Two times a day (BID) | OPHTHALMIC | Status: AC | PRN

## 2014-04-20 MED ORDER — DULOXETINE HCL 60 MG PO CPEP: 60.0000 mg | ORAL_CAPSULE | Freq: Every day | ORAL | Status: AC

## 2014-04-20 NOTE — Telephone Encounter (Signed)
Medications called in

## 2014-04-20 NOTE — Telephone Encounter (Signed)
Please add DULoxetine (CYMBALTA) 60 MG capsule to the below re-fill request

## 2014-04-20 NOTE — Telephone Encounter (Signed)
[id number] - [place], [place] - [address] is requesting re-fill on prednisoLONE acetate (PRED FORTE) 1 % ophthalmic suspension

## 2014-04-27 ENCOUNTER — Telehealth: Payer: Self-pay | Admitting: Family Medicine

## 2014-04-27 NOTE — Telephone Encounter (Signed)
yes

## 2014-04-27 NOTE — Telephone Encounter (Signed)
[id number] - [place], [place] - [address] is requesting re-fill on traMADol (ULTRAM) 50 MG tablet °

## 2014-04-27 NOTE — Telephone Encounter (Signed)
Is this ok to refill?  

## 2014-04-28 ENCOUNTER — Other Ambulatory Visit: Payer: Self-pay

## 2014-04-28 MED ORDER — TRAMADOL HCL 50 MG PO TABS: 50.0000 mg | ORAL_TABLET | Freq: Four times a day (QID) | ORAL | Status: DC | PRN

## 2014-04-28 NOTE — Telephone Encounter (Signed)
Medication refilled

## 2014-05-03 ENCOUNTER — Encounter: Payer: Self-pay | Admitting: Internal Medicine

## 2014-05-03 ENCOUNTER — Ambulatory Visit (INDEPENDENT_AMBULATORY_CARE_PROVIDER_SITE_OTHER): Payer: PRIVATE HEALTH INSURANCE | Admitting: Internal Medicine

## 2014-05-03 VITALS — BP 110/68 | HR 97 | Ht 65.0 in | Wt 171.6 lb

## 2014-05-03 DIAGNOSIS — R1310 Dysphagia, unspecified: Secondary | ICD-10-CM

## 2014-05-03 DIAGNOSIS — K222 Esophageal obstruction: Secondary | ICD-10-CM

## 2014-05-03 MED ORDER — ESOMEPRAZOLE MAGNESIUM 40 MG PO CPDR: 40.0000 mg | DELAYED_RELEASE_CAPSULE | ORAL | Status: DC

## 2014-05-03 NOTE — Patient Instructions (Addendum)
You have been scheduled for an endoscopy. Please follow written instructions given to you at your visit today. If you use inhalers (even only as needed), please bring them with you on the day of your procedure. Your physician has requested that you go to www.startemmi.com and enter the access code given to you at your visit today. This web site gives a general overview about your procedure. However, you should still follow specific instructions given to you by our office regarding your preparation for the procedure.  We have sent the following prescriptions to your mail in pharmacy: esomeprazole  If you have not heard from your mail in pharmacy within 1 week or if you have not received your medication in the mail, please contact us at [phone] so we may find out why.  CC: Dr Annice PihSteven Hunter

## 2014-05-03 NOTE — Progress Notes (Signed)
Anastasia PallMarion J Matsumura 02/13/1931 [id number]  Note: This dictation was prepared with Dragon digital system. Any transcriptional errors that result from this procedure are unintentional.   History of Present Illness:  This is an 80 year old African-American female with a history of benign distal esophageal stricture which was dilated on multiple occasions in the past, last time in November 2012. She has recurrent solid food dysphagia. She denies hoarseness or coughing. She denies dysphagia to liquids. There is a history of a gastric ulcer due to NSAIDs in 2010. She had complete relief of dysphagia after the last endoscopy in 2012.     Past Medical History  Diagnosis Date  . PUD (peptic ulcer disease)   . Unspecified essential hypertension   . Esophageal reflux   . Anemia   . Anxiety   . Arthritis   . Blood transfusion   . Cataract     surgery 2008  . Stress incontinence   . Esophageal stricture   . H. pylori infection   . Hiatal hernia   . Renal cyst   . Diverticulosis   . Esophageal dysmotility   . DIVERTICULOSIS OF COLON 03/10/2008    Qualifier: Diagnosis of  By: Nelson-Smith CMA (AAMA), Dottie    . RENAL CYST 03/01/2010    Stable over course of one year. No further followup      Past Surgical History  Procedure Laterality Date  . Abdominal hysterectomy    . Rotator cuff repair      right side, left-unknown repair  . Oophorectomy    . Cataract extraction, bilateral    . Right knee replacement    . Knee arthroplasty  11/01/2011    Procedure: COMPUTER ASSISTED TOTAL KNEE ARTHROPLASTY;  Surgeon: Harvie JuniorJohn L Graves, MD;  Location: [facility] OR;  Service: Orthopedics;  Laterality: Left;  GENERAL ANESTHESIA WITH PRE OP FEMORAL NERVE BLOCK    Allergies  Allergen Reactions  . Nsaids Other (See Comments)    REACTION: intolerant due to reflux, history of ulcers    Family history and social history have been reviewed.  Review of Systems:   The remainder of the 10 point ROS is negative except as  outlined in the H&P  Physical Exam: General Appearance Well developed, in no distress Eyes  Non icteric   Psychological Normal mood and affect  Assessment and Plan:   Problem #461 80 year old African-American female with recurrent benign distal esophageal stricture and recurrent solid food dysphagia. Her last dilatation was 3 years ago. She continues on Nexium 40 mg daily. She will be switching to generic Nexium at the request of her insurance. We will set her up for upper endoscopy and dilation.  Problem #2 Neoplastic screening. Her last colonoscopy in May 2008 showed diverticulosis. A recall colonoscopy in 10 years would be in 2018. There will be no recall due to age.    Lina SarDora Tsuneo Faison 05/03/2014

## 2014-05-10 ENCOUNTER — Other Ambulatory Visit: Payer: Self-pay | Admitting: Family Medicine

## 2014-05-25 ENCOUNTER — Ambulatory Visit: Payer: Medicare Other | Admitting: Family Medicine

## 2014-06-21 ENCOUNTER — Ambulatory Visit (AMBULATORY_SURGERY_CENTER): Payer: PRIVATE HEALTH INSURANCE | Admitting: Internal Medicine

## 2014-06-21 ENCOUNTER — Encounter: Payer: Self-pay | Admitting: Internal Medicine

## 2014-06-21 VITALS — BP 117/61 | HR 64 | Temp 96.9°F | Resp 17 | Ht 65.0 in | Wt 171.0 lb

## 2014-06-21 DIAGNOSIS — K219 Gastro-esophageal reflux disease without esophagitis: Secondary | ICD-10-CM

## 2014-06-21 DIAGNOSIS — R1310 Dysphagia, unspecified: Secondary | ICD-10-CM

## 2014-06-21 DIAGNOSIS — K222 Esophageal obstruction: Secondary | ICD-10-CM

## 2014-06-21 MED ORDER — SODIUM CHLORIDE 0.9 % IV SOLN: 500.0000 mL | INTRAVENOUS | Status: DC

## 2014-06-21 NOTE — Op Note (Signed)
[facility] [address] [place], [id number]   ENDOSCOPY PROCEDURE REPORT  PATIENT: [name], [name]  MR#: [id number] BIRTHDATE: 05/04/1931 , 83  yrs. old GENDER: female ENDOSCOPIST: Hart Carwinora M Brodie, MD REFERRED BY:  Fleet ContrasEdwin Avbuere, M.D. PROCEDURE DATE:  06/21/2014 PROCEDURE:  EGD, diagnostic and Savary dilation of esophagus ASA CLASS:     Class II INDICATIONS:  recurrent dysphagia to solids.  History of esophageal strictures dilated in November 2012.  History of gastric ulcer secondary to NSAIDs iin 2010, Noncardiac chest pains.  Cardiac cause ruled out. MEDICATIONS: Monitored anesthesia care and Propofol 150 mg IV TOPICAL ANESTHETIC: none  DESCRIPTION OF PROCEDURE: After the risks benefits and alternatives of the procedure were thoroughly explained, informed consent was obtained.  The LB [id number] endoscope was introduced through the mouth and advanced to the second portion of the duodenum , Without limitations.  The instrument was slowly withdrawn as the mucosa was fully examined.    Esophagus: proximal, mid and distal esophageal mucosa appeared normal. There was no esophagitis. There was mild benign-appearing distal esophageal stricture measuring about 14 mm in diameter. It allowed the endoscope traversed with minimal   resistance Stomach: stomach was insufflated with air and showed normal rugal folds. The gastric antrum and pyloric outlet were unremarkable. Retroflexion of the scope revealed normal fundus and cardia Duodenum:  duodenal bulb and descending duodenum was normal Savory dilators passed over a guidewire starting with 14 mm, 15 mm and 16 mm dilators. There was minimal resistance with the last dilator. There was no blood on the dilators[         The scope was then withdrawn from the patient and the procedure completed.  COMPLICATIONS: There were no immediate complications.  ENDOSCOPIC IMPRESSION: 1.mild benign-appearing distal esophageal  stricture 2.Dilation with Savary dilators from 14-16 mm  RECOMMENDATIONS: 1.  Anti-reflux regimen to be follow 2.  Continue PPI 3. repeat dilatation when necessary REPEAT EXAM:  eSigned:  Hart Carwinora M Brodie, MD 06/21/2014 3:57 PM    CC:  PATIENT NAME:  [name], [name] MR#: [id number]

## 2014-06-21 NOTE — Progress Notes (Signed)
Called to room to assist during endoscopic procedure.  Patient ID and intended procedure confirmed with present staff. Received instructions for my participation in the procedure from the performing physician.  

## 2014-06-21 NOTE — Progress Notes (Signed)
Report to PACU, RN, vss, BBS= Clear.  

## 2014-06-21 NOTE — Patient Instructions (Signed)
Discharge instructions given. Handout on a dilatation diet. Resume previous medications. YOU HAD AN ENDOSCOPIC PROCEDURE TODAY AT THE [facility]: Refer to the procedure report that was given to you for any specific questions about what was found during the examination.  If the procedure report does not answer your questions, please call your gastroenterologist to clarify.  If you requested that your care partner not be given the details of your procedure findings, then the procedure report has been included in a sealed envelope for you to review at your convenience later.  YOU SHOULD EXPECT: Some feelings of bloating in the abdomen. Passage of more gas than usual.  Walking can help get rid of the air that was put into your GI tract during the procedure and reduce the bloating. If you had a lower endoscopy (such as a colonoscopy or flexible sigmoidoscopy) you may notice spotting of blood in your stool or on the toilet paper. If you underwent a bowel prep for your procedure, then you may not have a normal bowel movement for a few days.  DIET: Your first meal following the procedure should be a light meal and then it is ok to progress to your normal diet.  A half-sandwich or bowl of soup is an example of a good first meal.  Heavy or fried foods are harder to digest and may make you feel nauseous or bloated.  Likewise meals heavy in dairy and vegetables can cause extra gas to form and this can also increase the bloating.  Drink plenty of fluids but you should avoid alcoholic beverages for 24 hours.  ACTIVITY: Your care partner should take you home directly after the procedure.  You should plan to take it easy, moving slowly for the rest of the day.  You can resume normal activity the day after the procedure however you should NOT DRIVE or use heavy machinery for 24 hours (because of the sedation medicines used during the test).    SYMPTOMS TO REPORT IMMEDIATELY: A gastroenterologist can be  reached at any hour.  During normal business hours, 8:30 AM to 5:00 PM Monday through Friday, call [phone].  After hours and on weekends, please call the GI answering service at [phone] who will take a message and have the physician on call contact you.   Following upper endoscopy (EGD)  Vomiting of blood or coffee ground material  New chest pain or pain under the shoulder blades  Painful or persistently difficult swallowing  New shortness of breath  Fever of 100F or higher  Black, tarry-looking stools  FOLLOW UP: If any biopsies were taken you will be contacted by phone or by letter within the next 1-3 weeks.  Call your gastroenterologist if you have not heard about the biopsies in 3 weeks.  Our staff will call the home number listed on your records the next business day following your procedure to check on you and address any questions or concerns that you may have at that time regarding the information given to you following your procedure. This is a courtesy call and so if there is no answer at the home number and we have not heard from you through the emergency physician on call, we will assume that you have returned to your regular daily activities without incident.  SIGNATURES/CONFIDENTIALITY: You and/or your care partner have signed paperwork which will be entered into your electronic medical record.  These signatures attest to the fact that that the information above on your After   Visit Summary has been reviewed and is understood.  Full responsibility of the confidentiality of this discharge information lies with you and/or your care-partner. 

## 2014-06-22 ENCOUNTER — Telehealth: Payer: Self-pay | Admitting: *Deleted

## 2014-06-22 NOTE — Telephone Encounter (Signed)
  Follow up Call-  Call back number 06/21/2014  Post procedure Call Back phone  # [phone] hm  Permission to leave phone message Yes     Patient questions:  Do you have a fever, pain , or abdominal swelling? No. Pain Score  0 *  Have you tolerated food without any problems? Yes.    Have you been able to return to your normal activities? Yes.    Do you have any questions about your discharge instructions: Diet   No. Medications  No. Follow up visit  No.  Do you have questions or concerns about your Care? No.  Actions: * If pain score is 4 or above: No action needed, pain <4.

## 2014-06-30 ENCOUNTER — Ambulatory Visit: Payer: Medicare Other | Admitting: Family

## 2014-07-19 ENCOUNTER — Other Ambulatory Visit: Payer: Self-pay | Admitting: *Deleted

## 2014-07-19 MED ORDER — TRAMADOL HCL 50 MG PO TABS: 50.0000 mg | ORAL_TABLET | Freq: Four times a day (QID) | ORAL | Status: DC | PRN

## 2014-07-19 NOTE — Telephone Encounter (Signed)
Is this ok to refill?  

## 2014-07-19 NOTE — Telephone Encounter (Signed)
May fill, 6 months worth

## 2014-08-15 ENCOUNTER — Other Ambulatory Visit: Payer: Self-pay | Admitting: Family Medicine

## 2014-09-08 ENCOUNTER — Telehealth: Payer: Self-pay

## 2014-09-08 ENCOUNTER — Telehealth: Payer: Self-pay | Admitting: Family Medicine

## 2014-09-08 NOTE — Telephone Encounter (Signed)
PCP removed from banner

## 2014-09-08 NOTE — Telephone Encounter (Signed)
Called pt to update flu report and she informed me that she is no longer seeing Dr. Durene CalHunter as her provider.

## 2014-09-08 NOTE — Telephone Encounter (Signed)
Pt was called for flu report and told CMA that she was not a patient of Dr Pamala HurryHunters

## 2014-10-24 ENCOUNTER — Other Ambulatory Visit: Payer: Self-pay

## 2014-10-24 DIAGNOSIS — Z1231 Encounter for screening mammogram for malignant neoplasm of breast: Secondary | ICD-10-CM

## 2014-10-26 ENCOUNTER — Ambulatory Visit
Admission: RE | Admit: 2014-10-26 | Discharge: 2014-10-26 | Disposition: A | Discharge status: Home / Self Care | Payer: Medicare Other | Source: Ambulatory Visit

## 2014-10-26 DIAGNOSIS — Z1231 Encounter for screening mammogram for malignant neoplasm of breast: Secondary | ICD-10-CM

## 2014-11-22 ENCOUNTER — Telehealth: Payer: Self-pay

## 2014-11-22 MED ORDER — AMLODIPINE-OLMESARTAN 5-40 MG PO TABS: 1.0000 | ORAL_TABLET | Freq: Every day | ORAL | Status: DC

## 2014-11-22 NOTE — Telephone Encounter (Signed)
Azor 5/40 Take 1 tablet every day #30  [facility]

## 2014-11-22 NOTE — Telephone Encounter (Signed)
Medication refilled

## 2014-12-27 ENCOUNTER — Other Ambulatory Visit: Payer: Self-pay | Admitting: *Deleted

## 2014-12-27 MED ORDER — POTASSIUM CHLORIDE CRYS ER 20 MEQ PO TBCR: 20.0000 meq | EXTENDED_RELEASE_TABLET | Freq: Every day | ORAL | Status: DC

## 2015-03-13 ENCOUNTER — Encounter: Payer: Self-pay | Admitting: Podiatry

## 2015-03-13 ENCOUNTER — Ambulatory Visit (INDEPENDENT_AMBULATORY_CARE_PROVIDER_SITE_OTHER): Payer: Medicare Other | Admitting: Podiatry

## 2015-03-13 VITALS — BP 126/71 | HR 79 | Resp 16

## 2015-03-13 DIAGNOSIS — E119 Type 2 diabetes mellitus without complications: Secondary | ICD-10-CM | POA: Diagnosis not present

## 2015-03-13 DIAGNOSIS — M2010 Hallux valgus (acquired), unspecified foot: Secondary | ICD-10-CM

## 2015-03-13 DIAGNOSIS — M2040 Other hammer toe(s) (acquired), unspecified foot: Secondary | ICD-10-CM | POA: Diagnosis not present

## 2015-03-13 NOTE — Progress Notes (Signed)
   Subjective:    Patient ID: [name], female    DOB: [birth year], 81 y.o.   MRN: [id number]  HPI Pt presents for diabetic foot exam, states her blood glucose is well controlled. She recently cut her toe while trimming her nails and wants to have area looked at. She denies pain in feet. Currently presents with bilateral bunions and hammertoes but denies pain in areas   Review of Systems  Cardiovascular: Positive for chest pain, palpitations and leg swelling.  Skin: Positive for color change and rash.  Hematological: Bruises/bleeds easily.  All other systems reviewed and are negative.      Objective:   Physical Exam        Assessment & Plan:

## 2015-03-13 NOTE — Progress Notes (Signed)
Subjective:     Patient ID: [name], female   DOB: Apr 12, 1931, 81 y.o.   MRN: [id number]  HPI patient presents with short-term history of diabetes just concerned about feet and whether or not there is any issues associated with her diabetic condition. Patient is concerned about her structural deformities and whether this could play into problems for her   Review of Systems  All other systems reviewed and are negative.      Objective:   Physical Exam  Constitutional: She is oriented to person, place, and time.  Cardiovascular: Intact distal pulses.   Musculoskeletal: Normal range of motion.  Neurological: She is oriented to person, place, and time.  Skin: Skin is warm and dry.  Nursing note and vitals reviewed.  neurovascular status found to be intact with sharp Dole vibratory intact. Patient has hyperostosis medial aspect first metatarsal head right over left deviated against the second toe with elevation of the lesser toes of a moderate rigid contracture bilateral. Good digital perfusion is noted patient's well oriented 3     Assessment:     Structural deformity with long-term diabetic who is under reasonably good control    Plan:     H&P and both conditions reviewed and at this point I have recommended wider shoes softer leather and soaks. I instructed what to do if symptoms were to worsen and at this time patient will be seen back for [place] to recheck again as needed and is advised on routine evaluations of her feet

## 2015-04-16 ENCOUNTER — Other Ambulatory Visit: Payer: Self-pay | Admitting: Family Medicine

## 2015-05-01 ENCOUNTER — Telehealth: Payer: Self-pay

## 2015-05-01 DIAGNOSIS — R1310 Dysphagia, unspecified: Secondary | ICD-10-CM

## 2015-05-01 NOTE — Telephone Encounter (Signed)
[facility]ium 40 mg 1 daily. Left a message for pt to call regarding her medication refills. Pt needs to make an appointment with Dr Adela LankArmbruster or Dr Lavon PaganiniNandigam.

## 2015-05-12 NOTE — Telephone Encounter (Signed)
Pt contacted. [name] has made an appt to establish care with Dr Lavon PaganiniNandigam for 07-20-15. Can we refill Nexium until next appt?

## 2015-05-12 NOTE — Telephone Encounter (Signed)
Ok to refill. Thanks 

## 2015-05-16 MED ORDER — ESOMEPRAZOLE MAGNESIUM 40 MG PO CPDR: 40.0000 mg | DELAYED_RELEASE_CAPSULE | ORAL | Status: DC

## 2015-05-16 NOTE — Telephone Encounter (Signed)
Medication refills sent. Pt aware to keep follow up.

## 2015-05-26 DIAGNOSIS — M5416 Radiculopathy, lumbar region: Secondary | ICD-10-CM | POA: Diagnosis not present

## 2015-05-26 DIAGNOSIS — M25512 Pain in left shoulder: Secondary | ICD-10-CM | POA: Diagnosis not present

## 2015-05-29 ENCOUNTER — Telehealth: Payer: Self-pay | Admitting: *Deleted

## 2015-05-29 DIAGNOSIS — R1310 Dysphagia, unspecified: Secondary | ICD-10-CM

## 2015-05-29 MED ORDER — ESOMEPRAZOLE MAGNESIUM 40 MG PO CPDR: 40.0000 mg | DELAYED_RELEASE_CAPSULE | ORAL | Status: DC

## 2015-05-29 NOTE — Telephone Encounter (Signed)
Received refill request from Mail order. We refilled when the patient was in the office but patient is requesting that 90 day of Nexium be sent to her mail order    [facility]

## 2015-05-30 DIAGNOSIS — H35033 Hypertensive retinopathy, bilateral: Secondary | ICD-10-CM | POA: Diagnosis not present

## 2015-05-30 DIAGNOSIS — H16223 Keratoconjunctivitis sicca, not specified as Sjogren's, bilateral: Secondary | ICD-10-CM | POA: Diagnosis not present

## 2015-07-20 ENCOUNTER — Encounter: Payer: Self-pay | Admitting: Gastroenterology

## 2015-07-20 ENCOUNTER — Ambulatory Visit (INDEPENDENT_AMBULATORY_CARE_PROVIDER_SITE_OTHER): Payer: Medicare Other | Admitting: Gastroenterology

## 2015-07-20 VITALS — BP 110/58 | HR 88 | Ht 64.0 in | Wt 160.4 lb

## 2015-07-20 DIAGNOSIS — K219 Gastro-esophageal reflux disease without esophagitis: Secondary | ICD-10-CM | POA: Diagnosis not present

## 2015-07-20 DIAGNOSIS — R1310 Dysphagia, unspecified: Secondary | ICD-10-CM

## 2015-07-20 MED ORDER — ESOMEPRAZOLE MAGNESIUM 40 MG PO CPDR: 40.0000 mg | DELAYED_RELEASE_CAPSULE | ORAL | Status: DC

## 2015-07-20 NOTE — Patient Instructions (Signed)
We refilled your Nexium today  Follow up as needed

## 2015-07-20 NOTE — Progress Notes (Signed)
[name]    [id number]    March 28, 1931  Primary Care Physician:AVBUERE,EDWIN A, MD  Referring Physician: Fleet Contras, MD [address] [place], [place] [address]  Chief complaint:  GERD  HPI: 82 year old African-American female with a history of benign distal esophageal stricture which was dilated on multiple occasions in the past, last time in December 2015 and prior to that in November 2012. She has improvement of dysphagia after last dilation. She continues to have intermittent GERD symptoms. Denies any nausea, vomiting, abdominal pain, melena or bright red blood per rectum. Weight is stable.   Outpatient Encounter Prescriptions as of 07/20/2015  Medication Sig  . ALPRAZolam (XANAX) 0.5 MG tablet TAKE 1/2-1 TABLET AT BEDTIME AS NEEDED  . aluminum chloride (HYPERCARE) 20 % external solution Apply 1 application topically daily. Apply under arms  . AZOR 5-40 MG tablet TAKE 1 TABLET BY MOUTH DAILY.  Marland Kitchen co-enzyme Q-10 30 MG capsule Take 30 mg by mouth daily.  . cyanocobalamin 500 MCG tablet Take 500 mcg by mouth daily.  . DULoxetine (CYMBALTA) 60 MG capsule Take 1 capsule (60 mg total) by mouth daily.  Marland Kitchen esomeprazole (NEXIUM) 40 MG capsule Take 1 capsule (40 mg total) by mouth every morning.  . ferrous sulfate 325 (65 FE) MG tablet Take 325 mg by mouth 2 (two) times daily.  . fexofenadine (ALLEGRA) 180 MG tablet TAKE 1 TABLET (180 MG TOTAL) BY MOUTH DAILY.  Marland Kitchen gabapentin (NEURONTIN) 300 MG capsule Take 1 capsule (300 mg total) by mouth 2 (two) times daily.  . hydrochlorothiazide (MICROZIDE) 12.5 MG capsule Take 1 capsule (12.5 mg total) by mouth daily.  Marland Kitchen HYDROcodone-acetaminophen (NORCO/VICODIN) 5-325 MG per tablet Take 1 tablet by mouth 2 (two) times daily as needed for pain. For pain  . Multiple Vitamin (MULTIVITAMIN WITH MINERALS) TABS Take 1 tablet by mouth every morning.   . potassium chloride SA (KLOR-CON M20) 20 MEQ tablet Take 1 tablet (20 mEq total) by mouth  daily.  . prednisoLONE acetate (PRED FORTE) 1 % ophthalmic suspension Place 1 drop into both eyes 2 (two) times daily as needed. For eye irritation  . senna (SENOKOT) 8.6 MG tablet Take 2 tablets by mouth 2 (two) times daily.  . sucralfate (CARAFATE) 1 G tablet TAKE 1 TABLET (1 GRAM) 4 TIMES A DAY (BETWEEN MEALS AND AT BEDTIME)  . TOVIAZ 8 MG TB24 tablet TAKE 1 TABLET EVERY DAY  . traMADol (ULTRAM) 50 MG tablet Take 1 tablet (50 mg total) by mouth every 6 (six) hours as needed.   No facility-administered encounter medications on file as of 07/20/2015.    Allergies as of 07/20/2015 - Review Complete 07/20/2015  Allergen Reaction Noted  . Nsaids Other (See Comments) 03/06/2010    Past Medical History  Diagnosis Date  . PUD (peptic ulcer disease)   . Unspecified essential hypertension   . Esophageal reflux   . Anemia   . Anxiety   . Arthritis   . Blood transfusion   . Cataract     surgery 2008  . Stress incontinence   . Esophageal stricture   . H. pylori infection   . Hiatal hernia   . Renal cyst   . Diverticulosis   . Esophageal dysmotility   . DIVERTICULOSIS OF COLON 03/10/2008    Qualifier: Diagnosis of  By: Nelson-Smith CMA (AAMA), Dottie    . RENAL CYST 03/01/2010    Stable over course of one year. No further  followup    . Blood transfusion without reported diagnosis   . Allergy   . Diabetes mellitus without complication (HCC)     no meds, diet controlled  . Depression     Past Surgical History  Procedure Laterality Date  . Abdominal hysterectomy    . Rotator cuff repair      right side, left-unknown repair  . Oophorectomy    . Cataract extraction, bilateral    . Right knee replacement    . Knee arthroplasty  11/01/2011    Procedure: COMPUTER ASSISTED TOTAL KNEE ARTHROPLASTY;  Surgeon: Harvie Junior, MD;  Location: [facility] OR;  Service: Orthopedics;  Laterality: Left;  GENERAL ANESTHESIA WITH PRE OP FEMORAL NERVE BLOCK  . Appendectomy    . Colonoscopy    . Upper  gastrointestinal endoscopy      Family History  Problem Relation Age of Onset  . Diabetes Mother     aunts  . Colon cancer Neg Hx   . Esophageal cancer Neg Hx   . Rectal cancer Neg Hx   . Stomach cancer Neg Hx     Social History   Social History  . Marital Status: Single    Spouse Name: N/A  . Number of Children: N/A  . Years of Education: N/A   Occupational History  . Glass blower/designer   Social History Main Topics  . Smoking status: Former Smoker -- 0.10 packs/day for 40 years    Types: Cigarettes    Quit date: 06/24/1990  . Smokeless tobacco: Never Used     Comment: quit 1985  . Alcohol Use: No  . Drug Use: No  . Sexual Activity: Not Currently   Other Topics Concern  . Not on file   Social History Narrative   Lives with daughter and greatgrandaughter (going to [employer] nursing), formerly divorced (husband now passed). 3 children (son passed from drugs).       Retired from [employer]: read, tv      Independent primarily-occasionally needs help lifting heavy things due to shoulder pain/arthritis      Review of systems: Review of Systems  Constitutional: Negative for fever and chills.  HENT: Negative.   Eyes: Negative for blurred vision.  Respiratory: Negative for cough, shortness of breath and wheezing.   Cardiovascular: Negative for chest pain and palpitations.  Gastrointestinal: as per HPI Genitourinary: Negative for dysuria, urgency, frequency and hematuria.  Musculoskeletal: Negative for myalgias, back pain and joint pain.  Skin: Negative for itching and rash.  Neurological: Negative for dizziness, tremors, focal weakness, seizures and loss of consciousness.  Endo/Heme/Allergies: Negative for environmental allergies.  Psychiatric/Behavioral: Negative for depression, suicidal ideas and hallucinations.  All other systems reviewed and are negative.   Physical Exam: Filed Vitals:   07/20/15 1435  BP: 110/58    Pulse: 88   Gen:      No acute distress HEENT:  EOMI, sclera anicteric Neck:     No masses; no thyromegaly Lungs:    Clear to auscultation bilaterally; normal respiratory effort CV:         Regular rate and rhythm; no murmurs Abd:      + bowel sounds; soft, non-tender; no palpable masses, no distension Ext:    No edema; adequate peripheral perfusion Skin:      Warm and dry; no rash Neuro: alert and oriented x 3 Psych: normal mood and affect  Data Reviewed: EGD 05/2014 1.mild benign-appearing distal esophageal stricture  2.Dilation with Savary dilators from 14-16 mm    Assessment and Plan/Recommendations:  82 year old female with history of chronic GERD, esophageal motility disorder and mild esophageal stricture status post dilation in December 2015 is here for follow-up visit Patient is doing well overall and has only intermittent dysphagia We'll hold off repeat EGD at this point  Continue Nexium and antireflux measures Return as needed  K. Scherry Ran , MD [phone] Mon-Fri 8a-5p [phone] after 5p, weekends, holidays

## 2015-08-07 ENCOUNTER — Encounter: Payer: Self-pay | Admitting: Internal Medicine

## 2015-12-08 ENCOUNTER — Other Ambulatory Visit: Payer: Self-pay | Admitting: Family Medicine

## 2016-03-05 ENCOUNTER — Telehealth: Payer: Self-pay | Admitting: Pulmonary Disease

## 2016-03-05 ENCOUNTER — Other Ambulatory Visit (INDEPENDENT_AMBULATORY_CARE_PROVIDER_SITE_OTHER): Payer: Medicare Other

## 2016-03-05 ENCOUNTER — Encounter: Payer: Self-pay | Admitting: Pulmonary Disease

## 2016-03-05 ENCOUNTER — Ambulatory Visit (INDEPENDENT_AMBULATORY_CARE_PROVIDER_SITE_OTHER): Payer: Medicare Other | Admitting: Pulmonary Disease

## 2016-03-05 VITALS — BP 144/76 | HR 88 | Ht 65.0 in | Wt 162.2 lb

## 2016-03-05 DIAGNOSIS — J309 Allergic rhinitis, unspecified: Secondary | ICD-10-CM

## 2016-03-05 DIAGNOSIS — J479 Bronchiectasis, uncomplicated: Secondary | ICD-10-CM | POA: Diagnosis not present

## 2016-03-05 DIAGNOSIS — R059 Cough, unspecified: Secondary | ICD-10-CM

## 2016-03-05 DIAGNOSIS — J42 Unspecified chronic bronchitis: Secondary | ICD-10-CM

## 2016-03-05 DIAGNOSIS — R05 Cough: Secondary | ICD-10-CM

## 2016-03-05 LAB — COMPREHENSIVE METABOLIC PANEL
ALT: 8 U/L (ref 0–35)
AST: 14 U/L (ref 0–37)
Albumin: 4.5 g/dL (ref 3.5–5.2)
Alkaline Phosphatase: 58 U/L (ref 39–117)
BILIRUBIN TOTAL: 0.3 mg/dL (ref 0.2–1.2)
BUN: 22 mg/dL (ref 6–23)
CALCIUM: 10.4 mg/dL (ref 8.4–10.5)
CHLORIDE: 103 meq/L (ref 96–112)
CO2: 27 meq/L (ref 19–32)
CREATININE: 1.23 mg/dL — AB (ref 0.40–1.20)
GFR: 53.64 mL/min — ABNORMAL LOW (ref >60.00)
GLUCOSE: 98 mg/dL (ref 70–99)
Potassium: 4.3 mEq/L (ref 3.5–5.1)
Sodium: 137 mEq/L (ref 135–145)
Total Protein: 8 g/dL (ref 6.0–8.3)

## 2016-03-05 NOTE — Telephone Encounter (Signed)
Spoke with pt. She is needing a OV and PFT in 4 weeks with JN. Pt [facility] that [facility]. We need to know where JN would like pt scheduled with him as he does not have any appointments in 4 weeks.  JN - please advise. Thanks!

## 2016-03-05 NOTE — Progress Notes (Signed)
Subjective:    Patient ID: [name], female    DOB: September 18, 1930, 82 y.o.   MRN: [id number]  HPI She reports she has had bronchitis intermittently but had it more frequently over the last year. Denies any dyspnea. She has had a cough for years that has been attributed to her GERD. She was evaluated at [facility] and started on Gabapentin with resolution in her cough. Her cough started back about a year ago. She resumed taking her Gabapentin but it hasn't seemed to help her cough. Her cough is rarely productive of a "greenish, yellowish" mucus. She reports she coughs more frequently in her bedroom rather than anywhere else in her house. Patient reports she has had frequent esophageal dilations for strictures. Continues to take Nexium & Carafate. She denies any morning brash water taste. She does continue to have intermittent reflux. She reports she has seasonal allergic rhinitis with congestion and drainage. Previously has taken Claritin but was switched to Allegra. Denies any fever, chills, or sweats. Recently prescribed albuterol but reports it makes her feel jittery and she hasn't started using it yet.   Review of Systems No chest pain, tightness, or pressure. No rashes or abnormal bruising. A pertinent 14 point review of systems is negative except as per the history of presenting illness.  Allergies  Allergen Reactions  . Nsaids Other (See Comments)    REACTION: intolerant due to reflux, history of ulcers    Current Outpatient Prescriptions on File Prior to Visit  Medication Sig Dispense Refill  . ALPRAZolam (XANAX) 0.5 MG tablet TAKE 1/2-1 TABLET AT BEDTIME AS NEEDED 30 tablet 3  . aluminum chloride (HYPERCARE) 20 % external solution Apply 1 application topically daily. Apply under arms    . AZOR 5-40 MG tablet TAKE 1 TABLET BY MOUTH DAILY. 30 tablet 4  . co-enzyme Q-10 30 MG capsule Take 30 mg by mouth daily.    . cyanocobalamin 500 MCG tablet Take 500 mcg by mouth daily.    .  DULoxetine (CYMBALTA) 60 MG capsule Take 1 capsule (60 mg total) by mouth daily. 90 capsule 0  . esomeprazole (NEXIUM) 40 MG capsule Take 1 capsule (40 mg total) by mouth every morning. 90 capsule 3  . ferrous sulfate 325 (65 FE) MG tablet Take 325 mg by mouth 2 (two) times daily.    . fexofenadine (ALLEGRA) 180 MG tablet TAKE 1 TABLET (180 MG TOTAL) BY MOUTH DAILY. 30 tablet 5  . gabapentin (NEURONTIN) 300 MG capsule Take 1 capsule (300 mg total) by mouth 2 (two) times daily. 90 capsule 1  . hydrochlorothiazide (MICROZIDE) 12.5 MG capsule Take 1 capsule (12.5 mg total) by mouth daily. 90 capsule 3  . HYDROcodone-acetaminophen (NORCO/VICODIN) 5-325 MG per tablet Take 1 tablet by mouth 2 (two) times daily as needed for pain. For pain    . Multiple Vitamin (MULTIVITAMIN WITH MINERALS) TABS Take 1 tablet by mouth every morning.     . potassium chloride SA (KLOR-CON M20) 20 MEQ tablet Take 1 tablet (20 mEq total) by mouth daily. 90 tablet 3  . prednisoLONE acetate (PRED FORTE) 1 % ophthalmic suspension Place 1 drop into both eyes 2 (two) times daily as needed. For eye irritation 5 mL 0  . senna (SENOKOT) 8.6 MG tablet Take 2 tablets by mouth 2 (two) times daily.    . sucralfate (CARAFATE) 1 G tablet TAKE 1 TABLET (1 GRAM) 4 TIMES A DAY (BETWEEN MEALS AND AT BEDTIME) 120 tablet 1  .  TOVIAZ 8 MG TB24 tablet TAKE 1 TABLET EVERY DAY 90 tablet 3  . traMADol (ULTRAM) 50 MG tablet Take 1 tablet (50 mg total) by mouth every 6 (six) hours as needed. 120 tablet 4   No current facility-administered medications on file prior to visit.     Past Medical History:  Diagnosis Date  . Allergy   . Anemia   . Anxiety   . Arthritis   . Blood transfusion   . Blood transfusion without reported diagnosis   . Bronchiectasis (HCC)   . Cataract    surgery 2008  . Depression   . Diabetes mellitus without complication (HCC)    no meds, diet controlled  . Diverticulosis   . DIVERTICULOSIS OF COLON 03/10/2008    Qualifier: Diagnosis of  By: Nelson-Smith CMA (AAMA), Dottie    . Esophageal dysmotility   . Esophageal reflux   . Esophageal stricture   . H. pylori infection   . Hiatal hernia   . PUD (peptic ulcer disease)   . Renal cyst   . RENAL CYST 03/01/2010   Stable over course of one year. No further followup    . Stress incontinence   . Unspecified essential hypertension     Past Surgical History:  Procedure Laterality Date  . ABDOMINAL HYSTERECTOMY    . APPENDECTOMY    . CATARACT EXTRACTION, BILATERAL    . COLONOSCOPY    . KNEE ARTHROPLASTY  11/01/2011   Procedure: COMPUTER ASSISTED TOTAL KNEE ARTHROPLASTY;  Surgeon: Harvie Junior, MD;  Location: [facility] OR;  Service: Orthopedics;  Laterality: Left;  GENERAL ANESTHESIA WITH PRE OP FEMORAL NERVE BLOCK  . OOPHORECTOMY    . right knee replacement    . ROTATOR CUFF REPAIR     right side, left-unknown repair  . UPPER GASTROINTESTINAL ENDOSCOPY      Family History  Problem Relation Age of Onset  . Diabetes Mother     aunts  . Heart disease Maternal Aunt   . Diabetes Maternal Aunt   . Kidney disease Maternal Aunt   . Stroke Maternal Grandmother   . Colon cancer Neg Hx   . Esophageal cancer Neg Hx   . Rectal cancer Neg Hx   . Stomach cancer Neg Hx   . Lung disease Neg Hx   . Rheumatologic disease Neg Hx     Social History   Social History  . Marital status: Single    Spouse name: N/A  . Number of children: N/A  . Years of education: N/A   Occupational History  . Glass blower/designer   Social History Main Topics  . Smoking status: Former Smoker    Packs/day: 0.10    Years: 40.00    Types: Cigarettes    Quit date: 06/24/1990  . Smokeless tobacco: Never Used  . Alcohol use No  . Drug use: No  . Sexual activity: Not Currently   Other Topics Concern  . None   Social History Narrative   Lives with daughter and greatgrandaughter (going to [employer] nursing), formerly divorced (husband now passed). 3 children (son passed from drugs).         Retired from [employer]: read, tv      Independent primarily-occasionally needs help lifting heavy things due to shoulder pain/arthritis      [facility]:   Originally from [place]. Always lived in [place]. Previously traveled to [facility] [facility] & [place]. Previously worked in Tax adviser at [employer]. Has  a dog currently. No bird exposure. Has prior water damage in her bedroom and bathroom. No visible mold or strong odor.       Objective:   Physical Exam BP (!) 144/76 (BP Location: Left Arm, Cuff Size: Normal)   Pulse 88   Ht 5\' 5"[id number]$  (1.651 m)   Wt 162 lb 3.2 oz (73.6 kg)   SpO2 96%   BMI 26.99 kg/m  General:  Awake. Alert. No acute distress.  Integument:  Warm & dry. No rash on exposed skin. No bruising. Lymphatics:  No appreciated cervical or supraclavicular lymphadenoapthy. HEENT:  Moist mucus membranes. No oral ulcers. No scleral injection or icterus. Minimal nasal turbinate swelling. Cardiovascular:  Regular rate. No edema. No appreciable JVD.  Pulmonary:  Good aeration & clear to auscultation bilaterally. Symmetric chest wall expansion. No accessory muscle use. Abdomen: Soft. Normal bowel sounds. Nondistended. Grossly nontender. Musculoskeletal:  Normal bulk and tone. Hand grip strength 5/5 bilaterally. No joint deformity or effusion appreciated. Neurological:  CN 2-12 grossly in tact. No meningismus. Moving all 4 extremities equally. Symmetric brachioradialis deep tendon reflexes. Psychiatric:  Mood and affect congruent. Speech normal rhythm, rate & tone.   IMAGING CT CHEST W/ CONTRAST 01/04/16 (per radiologist): Focal reticular density in traction bronchiectasis medial right lung base. Likely postinflammatory. Mild cylindrical bronchiectasis of the lower lobes. No evidence for airway obstruction. No pulmonary infiltrate noted. No pleural effusion or mass. No mediastinal mass or lymphadenopathy. No pericardial effusion.  CT CHEST W/O 12/20/7  (personally reviewed by me): less than 1 cm nodules in right lung noted. No pleural effusion or thickening. Linear opacity medial right lung base consistent with scar formation. No other mass or nodule appreciated. No pathologic mediastinal adenopathy. No pleural effusion or thickening. No pericardial effusion.    Assessment & Plan:  82 y.o. female with underlying bronchiectasis on CT imaging from July that is likely due to her previous GERD. With the high prevalence of her cough localized to her bedroom and prior flooding in her bedroom I am highly suspicious there is an underlying mold or exposure occurring there. Certainly there is a potential for underlying COPD/reactive airways disease/asthma as a cause for her ongoing cough and recurrent bronchitis. Alternatively an immunodeficiency is possible as well. Her allergic rhinitis seems to be well-controlled as is her reflux and I do not feel these are significant contributing factors or risks for her ongoing cough. I instructed the patient contact my office if she had any new breathing problems or questions before next appointment and that we would review her test results at her follow-up.  1. Cough:  Likely multifactorial but I am concerned about possible mold exposure in her bedroom. 2. Chronic Bronchitis/Bronchiectasis:  Checking full pulmonary function testing before next appointment. Checking serum alpha-1 antitrypsin phenotype, RAST panel, anti-CCP, quantitative immunoglobulin panel, & ANA with reflex to comprehensive panel. 3. GERD w/ Stricture & Hiatal Hernia:  Follows with [facility]. Continuing on Nexium & Carafate as prescribed. 4. Allergic Rhinitis:  Continuing on Allegra. Checking serum RAST panel. 5. Follow-up:  Patient to return to clinic in 4 weeks or sooner if needed.  Donna ChristenJennings E. Jamison NeighborNestor, M.D. [facility] Pager:  [phone] After 3pm or if no response, call [phone] 12:55 PM 03/05/16

## 2016-03-05 NOTE — Patient Instructions (Addendum)
   Call me if you notice any new breathing problems or questions before your next appointment.  I will see you back in 4 weeks to review your test results.  Make sure you have someone check your bedroom/carpet out for possible mold.   TESTS ORDERED: 1. Full PFTs before next appointment 2. Serum Alpha-1 Antitrypsin Phenotype, RAST Panel, ANA w/ Reflex to Comprehensive Panel, Anti-CCP, & Quantitative Immunoglobulin Panel today.

## 2016-03-05 NOTE — Telephone Encounter (Signed)
The patient can be booked in one of my blocked slots. Thanks.

## 2016-03-06 LAB — RESPIRATORY ALLERGY PROFILE REGION II ~~LOC~~
Allergen, A. alternata, m6: <0.1 kU/L
Allergen, C. Herbarum, M2: <0.1 kU/L
Allergen, D pternoyssinus,d7: <0.1 kU/L
Allergen, Mulberry, t76: <0.1 kU/L
Allergen, Oak,t7: <0.1 kU/L
Box Elder IgE: <0.1 kU/L
Cat Dander: <0.1 kU/L
Cockroach: <0.1 kU/L
D. farinae: <0.1 kU/L
Dog Dander: <0.1 kU/L
IGE (IMMUNOGLOBULIN E), SERUM: 24 kU/L (ref <115)
Johnson Grass: <0.1 kU/L
Pecan/Hickory Tree IgE: <0.1 kU/L
Rough Pigweed  IgE: <0.1 kU/L
Sheep Sorrel IgE: <0.1 kU/L
Timothy Grass: <0.1 kU/L

## 2016-03-06 LAB — IGG, IGA, IGM
IGA: 80 mg/dL — AB (ref 81–463)
IGM, SERUM: 135 mg/dL (ref 48–271)
IgG (Immunoglobin G), Serum: 1361 mg/dL (ref 694–1618)

## 2016-03-06 LAB — ANA: Anti Nuclear Antibody(ANA): NEGATIVE

## 2016-03-06 NOTE — Telephone Encounter (Signed)
[facility], [facility]  Ok to use a blocked slot if needed per Dr. Jamison NeighborNestor.  Needs to be done in 4-6 weeks.  Patient is aware that [facility].  Thanks.

## 2016-03-07 NOTE — Telephone Encounter (Signed)
Patient scheduled 04/09/16 @[facility]  arrival @2 :45 for 3 pm appt and then ROV @4 :15 with Dr. Jamison NeighborNestor at our office.  I have spoken with pt and she is aware of both appts and instructions for her PFTs.

## 2016-03-11 ENCOUNTER — Other Ambulatory Visit: Payer: Self-pay | Admitting: Gastroenterology

## 2016-03-11 DIAGNOSIS — R1310 Dysphagia, unspecified: Secondary | ICD-10-CM

## 2016-03-11 LAB — ALPHA-1 ANTITRYPSIN PHENOTYPE: A1 ANTITRYPSIN: 168 mg/dL (ref 83–199)

## 2016-04-09 ENCOUNTER — Ambulatory Visit (INDEPENDENT_AMBULATORY_CARE_PROVIDER_SITE_OTHER): Payer: Medicare Other | Admitting: Pulmonary Disease

## 2016-04-09 ENCOUNTER — Ambulatory Visit (HOSPITAL_COMMUNITY)
Admission: RE | Admit: 2016-04-09 | Discharge: 2016-04-09 | Disposition: A | Discharge status: Home / Self Care | Payer: Medicare Other | Source: Ambulatory Visit | Attending: Pulmonary Disease | Admitting: Pulmonary Disease

## 2016-04-09 VITALS — BP 136/64 | HR 95 | Ht 65.0 in | Wt 159.0 lb

## 2016-04-09 DIAGNOSIS — J479 Bronchiectasis, uncomplicated: Secondary | ICD-10-CM

## 2016-04-09 DIAGNOSIS — Z029 Encounter for administrative examinations, unspecified: Secondary | ICD-10-CM | POA: Diagnosis present

## 2016-04-09 DIAGNOSIS — R05 Cough: Secondary | ICD-10-CM

## 2016-04-09 DIAGNOSIS — J42 Unspecified chronic bronchitis: Secondary | ICD-10-CM

## 2016-04-09 DIAGNOSIS — R059 Cough, unspecified: Secondary | ICD-10-CM

## 2016-04-09 DIAGNOSIS — J309 Allergic rhinitis, unspecified: Secondary | ICD-10-CM

## 2016-04-09 LAB — PULMONARY FUNCTION TEST
DL/VA % pred: 106 %
DL/VA: 4.53 ml/min/mmHg/L
DLCO UNC % PRED: 108 %
DLCO UNC: 20.44 ml/min/mmHg
FEF 25-75 PRE: 2.17 L/s
FEF 25-75 Post: 2.19 L/sec
FEF2575-%Change-Post: 1 %
FEF2575-%Pred-Post: 235 %
FEF2575-%Pred-Pre: 232 %
FEV1-%CHANGE-POST: 0 %
FEV1-%PRED-POST: 191 %
FEV1-%PRED-PRE: 190 %
FEV1-POST: 2.16 L
FEV1-Pre: 2.15 L
FEV1FVC-%Change-Post: 1 %
FEV1FVC-%Pred-Pre: 107 %
FEV6-%Change-Post: -3 %
FEV6-%PRED-POST: 184 %
FEV6-%PRED-PRE: 191 %
FEV6-POST: 2.57 L
FEV6-PRE: 2.66 L
FEV6FVC-%PRED-POST: 106 %
FEV6FVC-%PRED-PRE: 106 %
FVC-%CHANGE-POST: -1 %
FVC-%PRED-POST: 177 %
FVC-%PRED-PRE: 179 %
FVC-POST: 2.62 L
FVC-PRE: 2.66 L
PRE FEV6/FVC RATIO: 100 %
Post FEV1/FVC ratio: 82 %
Post FEV6/FVC ratio: 100 %
Pre FEV1/FVC ratio: 81 %
RV % PRED: 105 %
RV: 2.38 L
TLC % pred: 115 %
TLC: 5.14 L

## 2016-04-09 MED ORDER — ALBUTEROL SULFATE (2.5 MG/3ML) 0.083% IN NEBU
2.5000 mg | INHALATION_SOLUTION | Freq: Once | RESPIRATORY_TRACT | Status: AC
  Administered 2016-04-09: 2.5 mg via RESPIRATORY_TRACT

## 2016-04-09 NOTE — Progress Notes (Signed)
Subjective:    Patient ID: [name], female    DOB: 12/15/1930, 82 y.o.   MRN: [id number]  C.C.:  Follow-up for Chronic Bronchitis/Bronchiectasis, GERD w/ Stricture & Hiatal Hernia, & Chronic Seasonal Allergic Rhinitis:  HPI Chronic Bronchitis/Bronchiectasis:  GERD w/ Stricture & Hiatal Hernia:  Follows with [facility]. Prescribed Nexium & Carafate.  Chronic Seasonal Allergic Rhinitis:  Prescribed Allegra.  Review of Systems   Allergies  Allergen Reactions  . Nsaids Other (See Comments)    REACTION: intolerant due to reflux, history of ulcers    Current Outpatient Prescriptions on File Prior to Visit  Medication Sig Dispense Refill  . albuterol (PROVENTIL) (2.5 MG/3ML) 0.083% nebulizer solution Take 2.5 mg by nebulization every 6 (six) hours as needed for wheezing or shortness of breath.    . ALPRAZolam (XANAX) 0.5 MG tablet TAKE 1/2-1 TABLET AT BEDTIME AS NEEDED 30 tablet 3  . aluminum chloride (HYPERCARE) 20 % external solution Apply 1 application topically daily. Apply under arms    . AZOR 5-40 MG tablet TAKE 1 TABLET BY MOUTH DAILY. 30 tablet 4  . co-enzyme Q-10 30 MG capsule Take 30 mg by mouth daily.    . cyanocobalamin 500 MCG tablet Take 500 mcg by mouth daily.    . DULoxetine (CYMBALTA) 60 MG capsule Take 1 capsule (60 mg total) by mouth daily. 90 capsule 0  . esomeprazole (NEXIUM) 40 MG capsule Take 1 capsule by mouth  every morning 90 capsule 3  . ferrous sulfate 325 (65 FE) MG tablet Take 325 mg by mouth 2 (two) times daily.    . fexofenadine (ALLEGRA) 180 MG tablet TAKE 1 TABLET (180 MG TOTAL) BY MOUTH DAILY. 30 tablet 5  . gabapentin (NEURONTIN) 300 MG capsule Take 1 capsule (300 mg total) by mouth 2 (two) times daily. 90 capsule 1  . hydrochlorothiazide (MICROZIDE) 12.5 MG capsule Take 1 capsule (12.5 mg total) by mouth daily. 90 capsule 3  . HYDROcodone-acetaminophen (NORCO/VICODIN) 5-325 MG per tablet Take 1 tablet by mouth 2 (two) times daily as needed for  pain. For pain    . Multiple Vitamin (MULTIVITAMIN WITH MINERALS) TABS Take 1 tablet by mouth every morning.     . potassium chloride SA (KLOR-CON M20) 20 MEQ tablet Take 1 tablet (20 mEq total) by mouth daily. 90 tablet 3  . prednisoLONE acetate (PRED FORTE) 1 % ophthalmic suspension Place 1 drop into both eyes 2 (two) times daily as needed. For eye irritation 5 mL 0  . PROAIR HFA 108 (90 Base) MCG/ACT inhaler 2 puffs 4 times a day as needed    . promethazine-dextromethorphan (PROMETHAZINE-DM) 6.25-15 MG/5ML syrup Take 10 mLs by mouth at bedtime.    . senna (SENOKOT) 8.6 MG tablet Take 2 tablets by mouth 2 (two) times daily.    . sucralfate (CARAFATE) 1 G tablet TAKE 1 TABLET (1 GRAM) 4 TIMES A DAY (BETWEEN MEALS AND AT BEDTIME) 120 tablet 1  . TOVIAZ 8 MG TB24 tablet TAKE 1 TABLET EVERY DAY 90 tablet 3  . traMADol (ULTRAM) 50 MG tablet Take 1 tablet (50 mg total) by mouth every 6 (six) hours as needed. 120 tablet 4   No current facility-administered medications on file prior to visit.     Past Medical History:  Diagnosis Date  . Allergy   . Anemia   . Anxiety   . Arthritis   . Blood transfusion   . Blood transfusion without reported diagnosis   . Bronchiectasis (HCC)   .  Cataract    surgery 2008  . Depression   . Diabetes mellitus without complication (HCC)    no meds, diet controlled  . Diverticulosis   . DIVERTICULOSIS OF COLON 03/10/2008   Qualifier: Diagnosis of  By: Nelson-Smith CMA (AAMA), Dottie    . Esophageal dysmotility   . Esophageal reflux   . Esophageal stricture   . H. pylori infection   . Hiatal hernia   . PUD (peptic ulcer disease)   . Renal cyst   . RENAL CYST 03/01/2010   Stable over course of one year. No further followup    . Stress incontinence   . Unspecified essential hypertension     Past Surgical History:  Procedure Laterality Date  . ABDOMINAL HYSTERECTOMY    . APPENDECTOMY    . CATARACT EXTRACTION, BILATERAL    . COLONOSCOPY    . KNEE  ARTHROPLASTY  11/01/2011   Procedure: COMPUTER ASSISTED TOTAL KNEE ARTHROPLASTY;  Surgeon: Harvie Junior, MD;  Location: [facility] OR;  Service: Orthopedics;  Laterality: Left;  GENERAL ANESTHESIA WITH PRE OP FEMORAL NERVE BLOCK  . OOPHORECTOMY    . right knee replacement    . ROTATOR CUFF REPAIR     right side, left-unknown repair  . UPPER GASTROINTESTINAL ENDOSCOPY      Family History  Problem Relation Age of Onset  . Diabetes Mother     aunts  . Heart disease Maternal Aunt   . Diabetes Maternal Aunt   . Kidney disease Maternal Aunt   . Stroke Maternal Grandmother   . Colon cancer Neg Hx   . Esophageal cancer Neg Hx   . Rectal cancer Neg Hx   . Stomach cancer Neg Hx   . Lung disease Neg Hx   . Rheumatologic disease Neg Hx     Social History   Social History  . Marital status: Single    Spouse name: N/A  . Number of children: N/A  . Years of education: N/A   Occupational History  . Glass blower/designer   Social History Main Topics  . Smoking status: Former Smoker    Packs/day: 0.10    Years: 40.00    Types: Cigarettes    Quit date: 06/24/1990  . Smokeless tobacco: Never Used  . Alcohol use No  . Drug use: No  . Sexual activity: Not Currently   Other Topics Concern  . Not on file   Social History Narrative   Lives with daughter and greatgrandaughter (going to [employer] nursing), formerly divorced (husband now passed). 3 children (son passed from drugs).       Retired from [employer]: read, tv      Independent primarily-occasionally needs help lifting heavy things due to shoulder pain/arthritis      [facility]:   Originally from [place]. Always lived in [place]. Previously traveled to [facility] & [place]. 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 No bird exposure. Has prior water damage in her bedroom and bathroom. No visible mold or strong odor.       Objective:   Physical Exam BP 136/64 (BP Location: Left Arm, Cuff  Size: Normal)   Pulse 95   Ht 5\' 5"  (1.651 m)   Wt 159 lb (72.1 kg)   SpO2 97%   BMI 26.46 kg/m  Patient refused to wait to be examined by me.   PFT 04/09/16: FVC 2.66 L (179%) FEV1 2.15 L (190%) FEV1/FVC 0.81 FEF  25-75 2.16 L (232%) negative bronchodilator response TLC 5.14 L (115%) RV 105% DLCO uncorrected 108%  IMAGING CT CHEST W/ CONTRAST 01/04/16 (per radiologist): Focal reticular density in traction bronchiectasis medial right lung base. Likely postinflammatory. Mild cylindrical bronchiectasis of the lower lobes. No evidence for airway obstruction. No pulmonary infiltrate noted. No pleural effusion or mass. No mediastinal mass or lymphadenopathy. No pericardial effusion.  CT CHEST W/O 12/20/7 (previously reviewed by me): less than 1 cm nodules in right lung noted. No pleural effusion or thickening. Linear opacity medial right lung base consistent with scar formation. No other mass or nodule appreciated. No pathologic mediastinal adenopathy. No pleural effusion or thickening. No pericardial effusion.  LABS 03/05/16 BMP: 137/4.3/103/27/22/1.23/98/10.4 LFT: 4.5/8.0/0.3/58/14/8 Alpha-1 antitrypsin: MM (168) IgG: 1361 IJ: 80 IgM: 135 IgE: 24 ANA: Negative RAST panel: Negative    Assessment & Plan:  82 y.o. female with underlying bronchiectasis on CT imaging from July that is likely due to her previous GERD.   Briefly explained the patient's test results as she was upset and did not wish to wait further to see the physician or delve further into a workup of her ongoing pulmonary problems. I apologized for her wait and directed her to our checkout area.  Donna Christen Jamison Neighbor, M.D. [facility] Pager:  [phone] After 3pm or if no response, call [phone] 5:26 PM 04/09/16

## 2016-12-04 IMAGING — MG MM SCREEN MAMMOGRAM BILATERAL
5 series · 5 of 5 positions shown · non-contrast
Comparison: None.

CLINICAL DATA: Screening.

EXAM:
DIGITAL SCREENING BILATERAL MAMMOGRAM WITH CAD

[R CC]
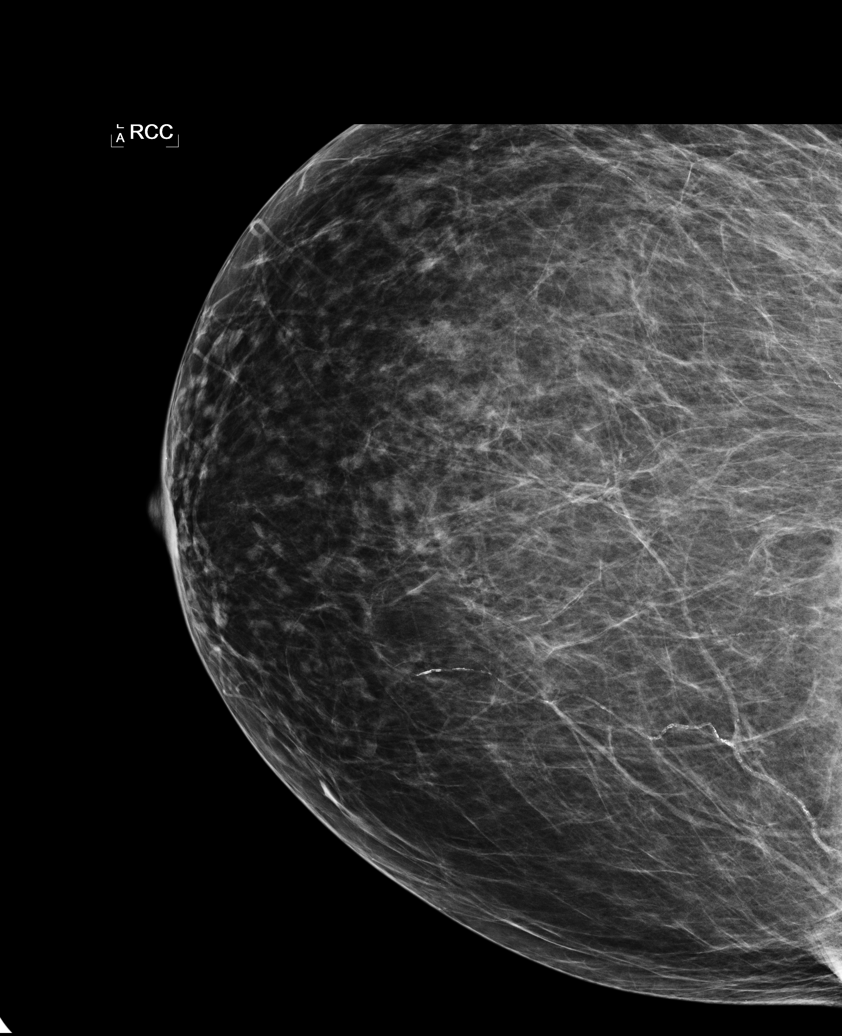

[L CC]
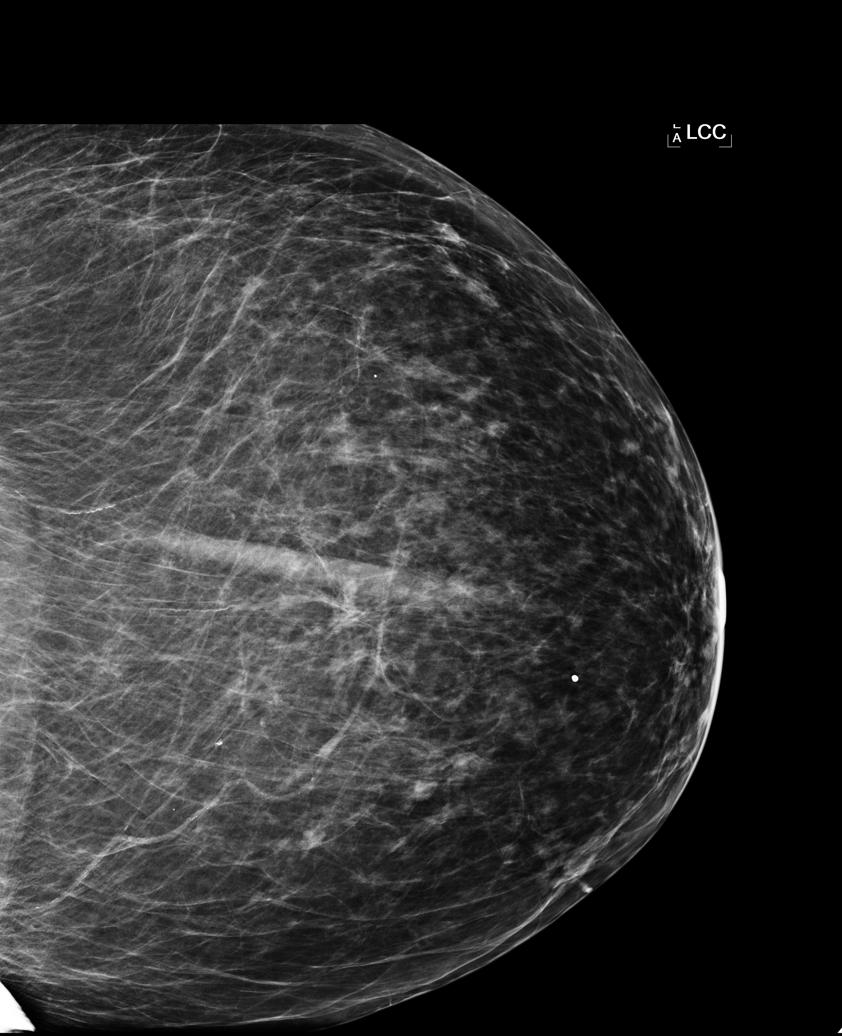

[L MLO]
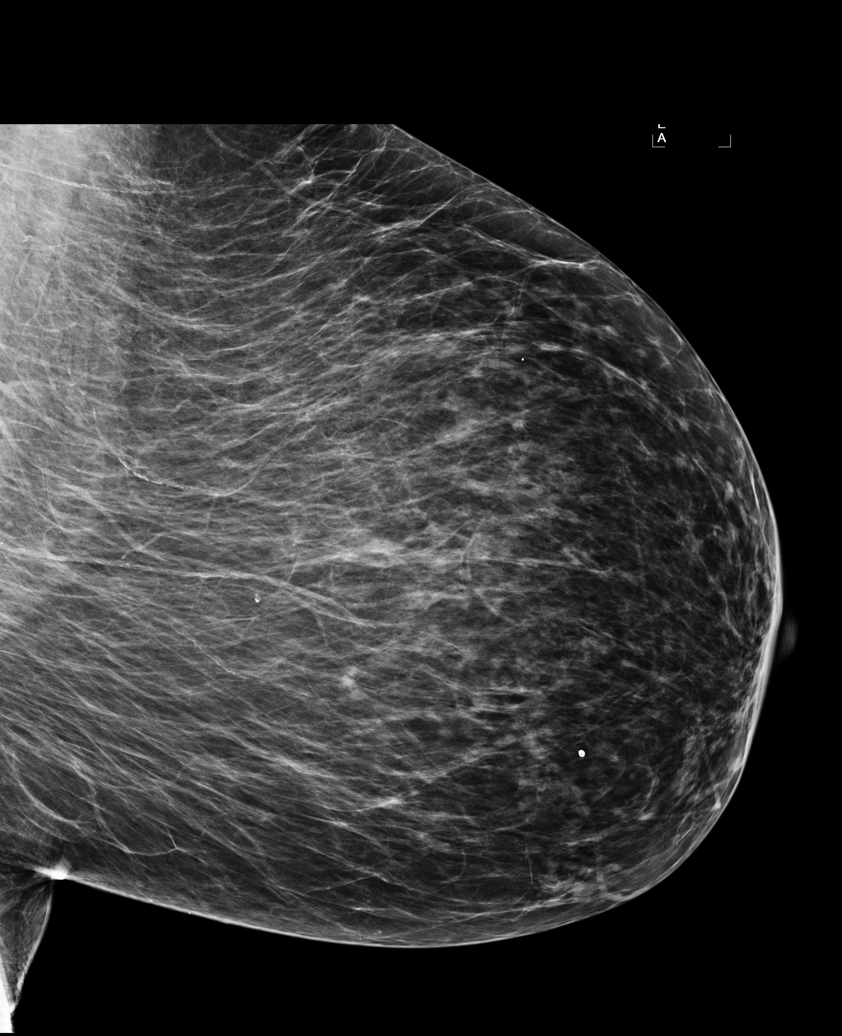

[R MLO]
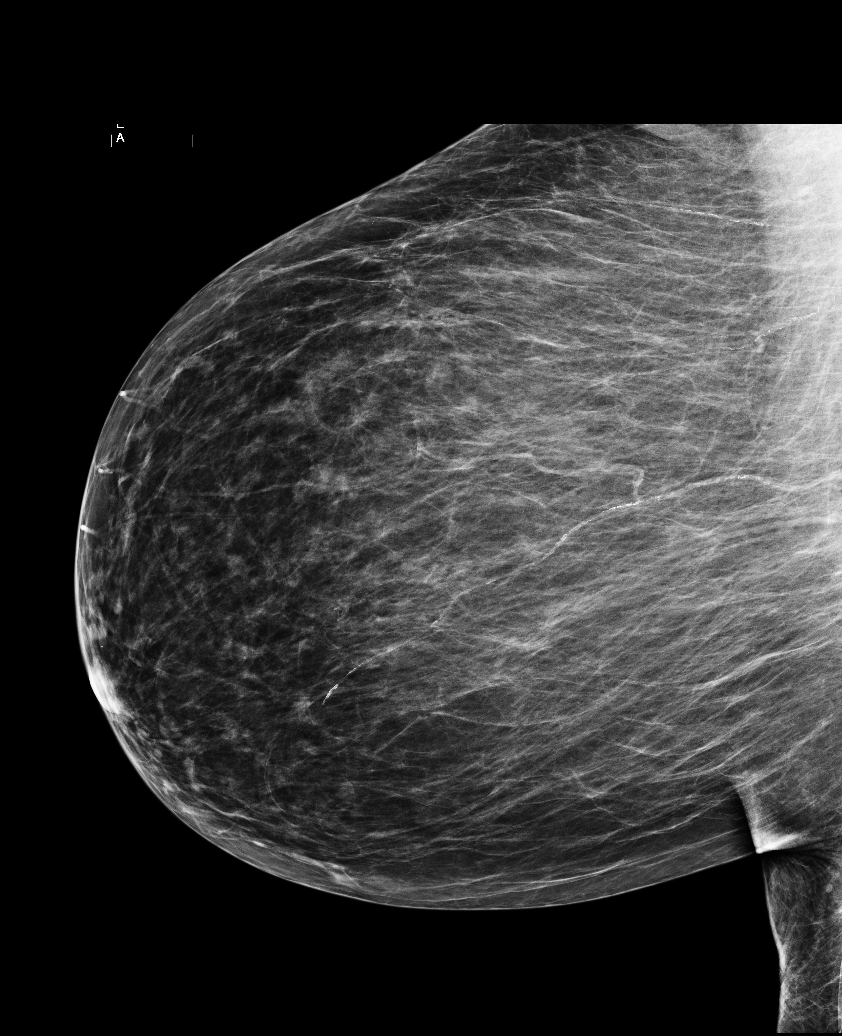

[L CV]
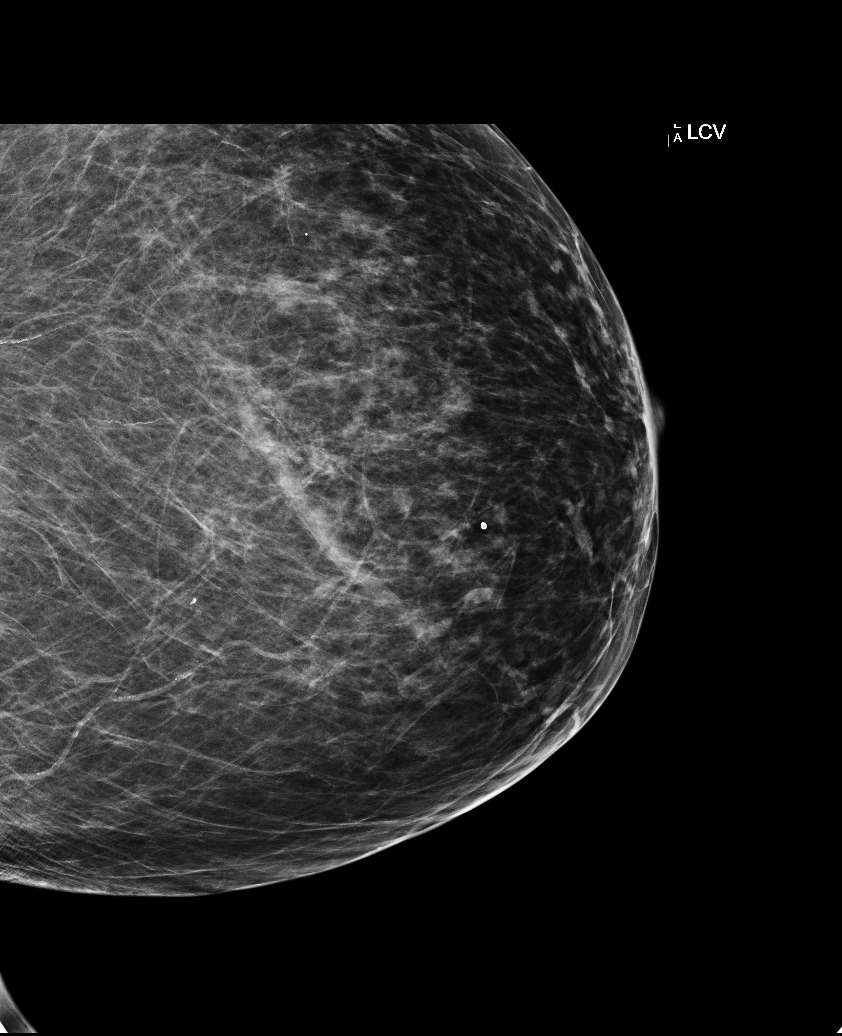

[5 of 5 positions shown; findings below may reference images not displayed]

ACR Breast Density Category b: There are scattered areas of
fibroglandular density.
FINDINGS: There are no findings suspicious for malignancy. Images were
processed with CAD.
IMPRESSION: No mammographic evidence of malignancy. A result letter of this
screening mammogram will be mailed directly to the patient.

RECOMMENDATION:
Screening mammogram in one year. (Code:[id number]-[id number])

BI-RADS CATEGORY  1: Negative.

## 2017-07-17 ENCOUNTER — Ambulatory Visit: Payer: Medicare Other | Admitting: Gastroenterology

## 2017-08-18 ENCOUNTER — Ambulatory Visit: Payer: Medicare Other | Admitting: Gastroenterology

## 2019-02-03 ENCOUNTER — Other Ambulatory Visit (HOSPITAL_COMMUNITY): Payer: Self-pay | Admitting: Ophthalmology

## 2019-02-03 DIAGNOSIS — G453 Amaurosis fugax: Secondary | ICD-10-CM

## 2019-02-04 ENCOUNTER — Other Ambulatory Visit: Payer: Self-pay

## 2019-02-04 ENCOUNTER — Ambulatory Visit (HOSPITAL_COMMUNITY)
Admission: RE | Admit: 2019-02-04 | Discharge: 2019-02-04 | Disposition: A | Discharge status: Home / Self Care | Payer: Medicare Other | Source: Ambulatory Visit | Attending: Ophthalmology | Admitting: Ophthalmology

## 2019-02-04 DIAGNOSIS — G453 Amaurosis fugax: Secondary | ICD-10-CM | POA: Diagnosis present

## 2019-02-04 NOTE — Progress Notes (Signed)
Bilateral Carotid duplex completed. Preliminary results in Chart review CV Proc. [place] Domanique Luckett,RVS 02/04/2019,10:04AM

## 2019-02-10 ENCOUNTER — Encounter (HOSPITAL_COMMUNITY): Payer: Medicare Other

## 2019-04-20 ENCOUNTER — Encounter: Payer: Self-pay | Admitting: Vascular Surgery

## 2019-04-20 ENCOUNTER — Other Ambulatory Visit: Payer: Self-pay

## 2019-04-20 ENCOUNTER — Ambulatory Visit (INDEPENDENT_AMBULATORY_CARE_PROVIDER_SITE_OTHER): Payer: Medicare Other | Admitting: Vascular Surgery

## 2019-04-20 VITALS — BP 112/72 | HR 100 | Temp 97.2°F | Resp 20 | Ht 65.0 in | Wt 179.0 lb

## 2019-04-20 DIAGNOSIS — G453 Amaurosis fugax: Secondary | ICD-10-CM | POA: Diagnosis not present

## 2019-04-20 NOTE — Progress Notes (Signed)
V[facility] and [facility]in Specialist of [place]  Patient name: [name] MRN: [id number] DOB: 1930-09-27 Sex: female  REASON FOR CONSULT: Evaluation of carotid artery disease with right eye amaurosis fugax  HPI: [name] is a 85 y.o. female, who is here today for evaluation of carotid disease.  She is a very pleasant 85 year old who has had 3 discrete events of amaurosis fugax in her right arm over the last 2 months.  She reports on each occasion she has central vision loss and then recovery.  She was seen by her ophthalmologist who felt this was related to retinal embolus and underwent carotid duplex.  I have her carotid duplex for evaluation.  Her duplex was at [facility] on 02/04/2019.  It did not show any significant carotid stenosis bilaterally but did have plaque with acoustic shadowing bilaterally.  She has no history of cardiac arrhythmia.  She is on aspirin therapy  Past Medical History:  Diagnosis Date  . Allergy   . Anemia   . Anxiety   . Arthritis   . Blood transfusion   . Blood transfusion without reported diagnosis   . Bronchiectasis (HCC)   . Cataract    surgery 2008  . Depression   . Diabetes mellitus without complication (HCC)    no meds, diet controlled  . Diverticulosis   . DIVERTICULOSIS OF COLON 03/10/2008   Qualifier: Diagnosis of  By: Nelson-Smith CMA (AAMA), Dottie    . Esophageal dysmotility   . Esophageal reflux   . Esophageal stricture   . H. pylori infection   . Hiatal hernia   . PUD (peptic ulcer disease)   . Renal cyst   . RENAL CYST 03/01/2010   Stable over course of one year. No further followup    . Stress incontinence   . Unspecified essential hypertension     Family History  Problem Relation Age of Onset  . Diabetes Mother        aunts  . Heart disease Maternal Aunt   . Diabetes Maternal Aunt   . Kidney disease Maternal Aunt   . Stroke Maternal Grandmother   . Colon cancer Neg Hx   . Esophageal  cancer Neg Hx   . Rectal cancer Neg Hx   . Stomach cancer Neg Hx   . Lung disease Neg Hx   . Rheumatologic disease Neg Hx     SOCIAL HISTORY: Social History   Socioeconomic History  . Marital status: Single    Spouse name: Not on file  . Number of children: Not on file  . Years of education: Not on file  . Highest education level: Not on file  Occupational History  . Occupation: Lobbyist: [employer]  Social Needs  . Financial resource strain: Not on file  . Food insecurity    Worry: Not on file    Inability: Not on file  . Transportation needs    Medical: Not on file    Non-medical: Not on file  Tobacco Use  . Smoking status: Former Smoker    Packs/day: 0.10    Years: 40.00    Pack years: 4.00    Types: Cigarettes    Quit date: 06/24/1990    Years since quitting: 28.8  . Smokeless tobacco: Never Used  Substance and Sexual Activity  . Alcohol use: No  . Drug use: No  . Sexual activity: Not Currently  Lifestyle  . Physical activity    Days per week: Not  on file    Minutes per session: Not on file  . Stress: Not on file  Relationships  . Social Musicianconnections    Talks on phone: Not on file    Gets together: Not on file    Attends religious service: Not on file    Active member of club or organization: Not on file    Attends meetings of clubs or organizations: Not on file    Relationship status: Not on file  . Intimate partner violence    Fear of current or ex partner: Not on file    Emotionally abused: Not on file    Physically abused: Not on file    Forced sexual activity: Not on file  Other Topics Concern  . Not on file  Social History Narrative   Lives with daughter and greatgrandaughter (going to [employer]), formerly divorced (husband now passed). 3 children (son passed from drugs).       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 Always lived in [place]. [place]. 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. Has a dog currently. No bird exposure. Has prior water damage in her bedroom and bathroom. No visible mold or strong odor.     Allergies  Allergen Reactions  . Nsaids Other (See Comments)    REACTION: intolerant due to reflux, history of ulcers    Current Outpatient Medications  Medication Sig Dispense Refill  . albuterol (PROVENTIL) (2.5 MG/3ML) 0.083% nebulizer solution Take 2.5 mg by nebulization every 6 (six) hours as needed for wheezing or shortness of breath.    Marland Kitchen. aluminum chloride (HYPERCARE) 20 % external solution Apply 1 application topically daily. Apply under arms    . AZOR 5-40 MG tablet TAKE 1 TABLET BY MOUTH DAILY. 30 tablet 4  . co-enzyme Q-10 30 MG capsule Take 30 mg by mouth daily.    . cyanocobalamin 500 MCG tablet Take 500 mcg by mouth daily.    . DULoxetine (CYMBALTA) 60 MG capsule Take 1 capsule (60 mg total) by mouth daily. 90 capsule 0  . esomeprazole (NEXIUM) 40 MG capsule Take 1 capsule by mouth  every morning 90 capsule 3  . ferrous sulfate 325 (65 FE) MG tablet Take 325 mg by mouth 2 (two) times daily.    Marland Kitchen. gabapentin (NEURONTIN) 300 MG capsule Take 1 capsule (300 mg total) by mouth 2 (two) times daily. 90 capsule 1  . HYDROcodone-acetaminophen (NORCO/VICODIN) 5-325 MG per tablet Take 1 tablet by mouth 2 (two) times daily as needed for pain. For pain    . Multiple Vitamin (MULTIVITAMIN WITH MINERALS) TABS Take 1 tablet by mouth every morning.     . prednisoLONE acetate (PRED FORTE) 1 % ophthalmic suspension Place 1 drop into both eyes 2 (two) times daily as needed. For eye irritation 5 mL 0  . PROAIR HFA 108 (90 Base) MCG/ACT inhaler 2 puffs 4 times a day as needed    . promethazine-dextromethorphan (PROMETHAZINE-DM) 6.25-15 MG/5ML syrup Take 10 mLs by mouth at bedtime.    . senna  (SENOKOT) 8.6 MG tablet Take 2 tablets by mouth 2 (two) times daily.    . sucralfate (CARAFATE) 1 G tablet TAKE 1 TABLET (1 GRAM) 4 TIMES A DAY (BETWEEN MEALS AND AT BEDTIME) 120  tablet 1   No current facility-administered medications for this visit.     REVIEW OF SYSTEMS:  [X]  denotes positive finding, [ ]  denotes negative finding Cardiac  Comments:  Chest pain or chest pressure:    Shortness of breath upon exertion: x   Short of breath when lying flat:    Irregular heart rhythm:        Vascular    Pain in calf, thigh, or hip brought on by ambulation:    Pain in feet at night that wakes you up from your sleep:     Blood clot in your veins:    Leg swelling:         Pulmonary    Oxygen at home:    Productive cough:     Wheezing:         Neurologic    Sudden weakness in arms or legs:     Sudden numbness in arms or legs:     Sudden onset of difficulty speaking or slurred speech:    Temporary loss of vision in one eye:  x   Problems with dizziness:  x       Gastrointestinal    Blood in stool:     Vomited blood:         Genitourinary    Burning when urinating:     Blood in urine:        Psychiatric    Major depression:         Hematologic    Bleeding problems:    Problems with blood clotting too easily:        Skin    Rashes or ulcers:        Constitutional    Fever or chills:      PHYSICAL EXAM: Vitals:   04/20/19 1457 04/20/19 1501  BP: 110/68 112/72  Pulse: 100   Resp: 20   Temp: (!) 97.2 F (36.2 C)   SpO2: 96%   Weight: 179 lb (81.2 kg)   Height: 5\' 5"  (1.651 m)     GENERAL: The patient is a well-nourished female, in no acute distress. The vital signs are documented above. CARDIOVASCULAR: Harsh right carotid bruit and soft left carotid bruit.  2+ radial and 2+ dorsalis pedis pulses bilaterally PULMONARY: There is good air exchange  ABDOMEN: Soft and non-tender  MUSCULOSKELETAL: There are no major deformities or cyanosis. NEUROLOGIC: No focal  weakness or paresthesias are detected. SKIN: There are no ulcers or rashes noted. PSYCHIATRIC: The patient has a normal affect.  DATA:  Carotid duplex was reviewed again showing no critical stenosis and no moderate stenosis but calcified irregular plaque.  MEDICAL ISSUES: Had long discussion with the patient regarding this.  She has had a very clear-cut 3 episodes of amaurosis fugax.  It is puzzling with her harsh right carotid bruit that she does not have any stenosis in her internal or external carotid artery.  I have recommended CT angiogram of her neck and head for further evaluation.  I explained that if she does have moderate stenosis with high-grade abrupt irregularity we would recommend endarterectomy.  If indeed the CT confirms no evidence of difficult carotid disease, would obtain 2D echocardiogram for completeness.  We will notify her following the results of her CT angiogram   Rosetta Posner, MD [facility] Vascular and Vein Specialists of [facility] Tel [phone] Pager [phone]

## 2019-04-21 ENCOUNTER — Other Ambulatory Visit: Payer: Self-pay | Admitting: Vascular Surgery

## 2019-04-21 DIAGNOSIS — G453 Amaurosis fugax: Secondary | ICD-10-CM

## 2019-04-30 ENCOUNTER — Ambulatory Visit
Admission: RE | Admit: 2019-04-30 | Discharge: 2019-04-30 | Disposition: A | Discharge status: Home / Self Care | Payer: Medicare Other | Source: Ambulatory Visit | Attending: Vascular Surgery | Admitting: Vascular Surgery

## 2019-04-30 DIAGNOSIS — G453 Amaurosis fugax: Secondary | ICD-10-CM

## 2019-04-30 MED ORDER — IOPAMIDOL (ISOVUE-370) INJECTION 76%
50.0000 mL | Freq: Once | INTRAVENOUS | Status: AC | PRN
  Administered 2019-04-30: 50 mL via INTRAVENOUS

## 2019-05-04 ENCOUNTER — Encounter: Payer: Self-pay | Admitting: Vascular Surgery

## 2019-05-04 ENCOUNTER — Other Ambulatory Visit: Payer: Self-pay

## 2019-05-04 ENCOUNTER — Encounter: Payer: Self-pay | Admitting: *Deleted

## 2019-05-04 ENCOUNTER — Ambulatory Visit (INDEPENDENT_AMBULATORY_CARE_PROVIDER_SITE_OTHER): Payer: Medicare Other | Admitting: Vascular Surgery

## 2019-05-04 VITALS — BP 120/71 | HR 100 | Temp 97.6°F | Resp 20 | Ht 65.0 in | Wt 176.0 lb

## 2019-05-04 DIAGNOSIS — G453 Amaurosis fugax: Secondary | ICD-10-CM

## 2019-05-04 DIAGNOSIS — I6521 Occlusion and stenosis of right carotid artery: Secondary | ICD-10-CM | POA: Diagnosis not present

## 2019-05-04 NOTE — Progress Notes (Signed)
Vascular and Vein Specialist of [place]  Patient name: [name] MRN: [id number] DOB: 10/25/1930 Sex: female  REASON FOR VISIT: Here for continued discussion of right amaurosis and recent CT scan  HPI: [name] is a 85 y.o. female here today for continued discussion of her episodes of right eye amaurosis fugax.  She had a right carotid duplex suggesting moderate calcific plaque.  She subsequently underwent CT scan for further evaluation of this on 04/30/2019 and is here today for continued discussion.  She has had no new neurologic deficits.  Past Medical History:  Diagnosis Date  . Allergy   . Anemia   . Anxiety   . Arthritis   . Blood transfusion   . Blood transfusion without reported diagnosis   . Bronchiectasis (HCC)   . Cataract    surgery 2008  . Depression   . Diabetes mellitus without complication (HCC)    no meds, diet controlled  . Diverticulosis   . DIVERTICULOSIS OF COLON 03/10/2008   Qualifier: Diagnosis of  By: Nelson-Smith CMA (AAMA), Dottie    . Esophageal dysmotility   . Esophageal reflux   . Esophageal stricture   . H. pylori infection   . Hiatal hernia   . PUD (peptic ulcer disease)   . Renal cyst   . RENAL CYST 03/01/2010   Stable over course of one year. No further followup    . Stress incontinence   . Unspecified essential hypertension     Family History  Problem Relation Age of Onset  . Diabetes Mother        aunts  . Heart disease Maternal Aunt   . Diabetes Maternal Aunt   . Kidney disease Maternal Aunt   . Stroke Maternal Grandmother   . Colon cancer Neg Hx   . Esophageal cancer Neg Hx   . Rectal cancer Neg Hx   . Stomach cancer Neg Hx   . Lung disease Neg Hx   . Rheumatologic disease Neg Hx     SOCIAL HISTORY: Social History   Tobacco Use  . Smoking status: Former Smoker    Packs/day: 0.10    Years: 40.00    Pack years: 4.00    Types: Cigarettes    Quit date: 06/24/1990    Years  since quitting: 28.8  . Smokeless tobacco: Never Used  Substance Use Topics  . Alcohol use: No    Allergies  Allergen Reactions  . Nsaids Other (See Comments)    REACTION: intolerant due to reflux, history of ulcers    Current Outpatient Medications  Medication Sig Dispense Refill  . albuterol (PROVENTIL) (2.5 MG/3ML) 0.083% nebulizer solution Take 2.5 mg by nebulization every 6 (six) hours as needed for wheezing or shortness of breath.    Marland Kitchen aluminum chloride (HYPERCARE) 20 % external solution Apply 1 application topically daily. Apply under arms    . amitriptyline (ELAVIL) 25 MG tablet Take 25 mg by mouth at bedtime.    . AZOR 5-40 MG tablet TAKE 1 TABLET BY MOUTH DAILY. 30 tablet 4  . co-enzyme Q-10 30 MG capsule Take 30 mg by mouth daily.    . cyanocobalamin 500 MCG tablet Take 500 mcg by mouth daily.    . DULoxetine (CYMBALTA) 60 MG capsule Take 1 capsule (60 mg total) by mouth daily. 90 capsule 0  . esomeprazole (NEXIUM) 40 MG capsule Take 1 capsule by mouth  every morning 90 capsule 3  . ferrous sulfate 325 (65 FE) MG tablet  Take 325 mg by mouth 2 (two) times daily.    Marland Kitchen gabapentin (NEURONTIN) 300 MG capsule Take 1 capsule (300 mg total) by mouth 2 (two) times daily. 90 capsule 1  . HYDROcodone-acetaminophen (NORCO/VICODIN) 5-325 MG per tablet Take 1 tablet by mouth 2 (two) times daily as needed for pain. For pain    . Multiple Vitamin (MULTIVITAMIN WITH MINERALS) TABS Take 1 tablet by mouth every morning.     . prednisoLONE acetate (PRED FORTE) 1 % ophthalmic suspension Place 1 drop into both eyes 2 (two) times daily as needed. For eye irritation 5 mL 0  . PROAIR HFA 108 (90 Base) MCG/ACT inhaler 2 puffs 4 times a day as needed    . promethazine-dextromethorphan (PROMETHAZINE-DM) 6.25-15 MG/5ML syrup Take 10 mLs by mouth at bedtime.    . senna (SENOKOT) 8.6 MG tablet Take 2 tablets by mouth 2 (two) times daily.    . sucralfate (CARAFATE) 1 G tablet TAKE 1 TABLET (1 GRAM) 4 TIMES  A DAY (BETWEEN MEALS AND AT BEDTIME) 120 tablet 1   No current facility-administered medications for this visit.     REVIEW OF SYSTEMS:  [X]  denotes positive finding, [ ]  denotes negative finding Cardiac  Comments:  Chest pain or chest pressure:    Shortness of breath upon exertion: x   Short of breath when lying flat:    Irregular heart rhythm:        Vascular    Pain in calf, thigh, or hip brought on by ambulation:    Pain in feet at night that wakes you up from your sleep:     Blood clot in your veins:    Leg swelling:           PHYSICAL EXAM: Vitals:   05/04/19 1044  BP: 120/71  Pulse: 100  Resp: 20  Temp: 97.6 F (36.4 C)  SpO2: 94%  Weight: 176 lb (79.8 kg)  Height: 5\' 5"  (1.651 m)    GENERAL: The patient is a well-nourished female, in no acute distress. The vital signs are documented above. CARDIOVASCULAR: 2+ left radial and 1-2+ right radial pulse PULMONARY: There is good air exchange  MUSCULOSKELETAL: There are no major deformities or cyanosis. NEUROLOGIC: No focal weakness or paresthesias are detected. SKIN: There are no ulcers or rashes noted. PSYCHIATRIC: The patient has a normal affect.  DATA:  CT was discussed with the patient.  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  She does have moderate calcific plaque in her right carotid bifurcation  MEDICAL ISSUES: I discussed the significance of this with the patient.  I explained that in all likelihood her episodes of amaurosis are related to carotid source.  Explained that this could be from her critical stenosis at the origin of her common carotid or the calcific plaque in her carotid bifurcation.  I explained that this would be a much more involved than simple carotid endarterectomy for correction.  May the best treated with carotid subclavian bypass and carotid endarterectomy.  Have recommended formal cerebral  arteriography for further evaluation of her supra aortic trunk for operative planning.  We will schedule this at her earliest convenience and discuss recommendations following this.    Rosetta Posner, MD FACS Vascular and Vein Specialists of [facility] Tel [phone] Pager [phone]

## 2019-05-13 ENCOUNTER — Other Ambulatory Visit (HOSPITAL_COMMUNITY)
Admission: RE | Admit: 2019-05-13 | Discharge: 2019-05-13 | Disposition: A | Discharge status: Home / Self Care | Payer: Medicare Other | Source: Ambulatory Visit | Attending: Vascular Surgery | Admitting: Vascular Surgery

## 2019-05-13 DIAGNOSIS — Z20828 Contact with and (suspected) exposure to other viral communicable diseases: Secondary | ICD-10-CM | POA: Diagnosis not present

## 2019-05-13 DIAGNOSIS — Z01812 Encounter for preprocedural laboratory examination: Secondary | ICD-10-CM | POA: Insufficient documentation

## 2019-05-14 LAB — NOVEL CORONAVIRUS, NAA (HOSP ORDER, SEND-OUT TO REF LAB; TAT 18-24 HRS): SARS-CoV-2, NAA: NOT DETECTED

## 2019-05-17 ENCOUNTER — Other Ambulatory Visit: Payer: Self-pay

## 2019-05-17 ENCOUNTER — Ambulatory Visit (HOSPITAL_COMMUNITY)
Admission: RE | Admit: 2019-05-17 | Discharge: 2019-05-17 | Disposition: A | Discharge status: Home / Self Care | Payer: Medicare Other | Attending: Vascular Surgery | Admitting: Vascular Surgery

## 2019-05-17 ENCOUNTER — Encounter (HOSPITAL_COMMUNITY)
Admission: RE | Disposition: A | Discharge status: Home / Self Care | Payer: Self-pay | Source: Home / Self Care | Attending: Vascular Surgery

## 2019-05-17 ENCOUNTER — Encounter (HOSPITAL_COMMUNITY): Payer: Self-pay | Admitting: Vascular Surgery

## 2019-05-17 DIAGNOSIS — E119 Type 2 diabetes mellitus without complications: Secondary | ICD-10-CM | POA: Diagnosis not present

## 2019-05-17 DIAGNOSIS — Z87891 Personal history of nicotine dependence: Secondary | ICD-10-CM | POA: Insufficient documentation

## 2019-05-17 DIAGNOSIS — F329 Major depressive disorder, single episode, unspecified: Secondary | ICD-10-CM | POA: Diagnosis not present

## 2019-05-17 DIAGNOSIS — K219 Gastro-esophageal reflux disease without esophagitis: Secondary | ICD-10-CM | POA: Insufficient documentation

## 2019-05-17 DIAGNOSIS — G453 Amaurosis fugax: Secondary | ICD-10-CM | POA: Diagnosis not present

## 2019-05-17 DIAGNOSIS — F419 Anxiety disorder, unspecified: Secondary | ICD-10-CM | POA: Insufficient documentation

## 2019-05-17 DIAGNOSIS — Z79899 Other long term (current) drug therapy: Secondary | ICD-10-CM | POA: Insufficient documentation

## 2019-05-17 DIAGNOSIS — I10 Essential (primary) hypertension: Secondary | ICD-10-CM | POA: Diagnosis not present

## 2019-05-17 DIAGNOSIS — I6521 Occlusion and stenosis of right carotid artery: Secondary | ICD-10-CM

## 2019-05-17 HISTORY — PX: CAROTID ANGIOGRAPHY: CATH118230

## 2019-05-17 LAB — POCT I-STAT, CHEM 8
BUN: 28 mg/dL — ABNORMAL HIGH (ref 8–23)
Calcium, Ion: 1.28 mmol/L (ref 1.15–1.40)
Chloride: 100 mmol/L (ref 98–111)
Creatinine, Ser: 1.5 mg/dL — ABNORMAL HIGH (ref 0.44–1.00)
Glucose, Bld: 109 mg/dL — ABNORMAL HIGH (ref 70–99)
HCT: 35 % — ABNORMAL LOW (ref 36.0–46.0)
Hemoglobin: 11.9 g/dL — ABNORMAL LOW (ref 12.0–15.0)
Potassium: 3.8 mmol/L (ref 3.5–5.1)
Sodium: 140 mmol/L (ref 135–145)
TCO2: 27 mmol/L (ref 22–32)

## 2019-05-17 LAB — GLUCOSE, CAPILLARY: Glucose-Capillary: 99 mg/dL (ref 70–99)

## 2019-05-17 LAB — POCT ACTIVATED CLOTTING TIME
Activated Clotting Time: 197 seconds
Activated Clotting Time: 175 seconds

## 2019-05-17 SURGERY — CAROTID ANGIOGRAPHY
Anesthesia: LOCAL | Laterality: Right

## 2019-05-17 MED ORDER — HEPARIN SODIUM (PORCINE) 1000 UNIT/ML IJ SOLN
INTRAMUSCULAR | Status: DC | PRN
  Administered 2019-05-17: 5000 [IU] via INTRAVENOUS

## 2019-05-17 MED ORDER — SODIUM CHLORIDE 0.9 % IV SOLN
INTRAVENOUS | Status: DC
  Administered 2019-05-17: 06:00:00 via INTRAVENOUS

## 2019-05-17 MED ORDER — OXYCODONE HCL 5 MG PO TABS: 5.0000 mg | ORAL_TABLET | ORAL | Status: DC | PRN

## 2019-05-17 MED ORDER — HEPARIN (PORCINE) IN NACL 1000-0.9 UT/500ML-% IV SOLN
INTRAVENOUS | Status: AC
  Filled 2019-05-17: qty 1000

## 2019-05-17 MED ORDER — LABETALOL HCL 5 MG/ML IV SOLN
10.0000 mg | INTRAVENOUS | Status: DC | PRN
  Administered 2019-05-17: 09:00:00 10 mg via INTRAVENOUS

## 2019-05-17 MED ORDER — SODIUM CHLORIDE 0.9% FLUSH: 3.0000 mL | Freq: Two times a day (BID) | INTRAVENOUS | Status: DC

## 2019-05-17 MED ORDER — LIDOCAINE HCL (PF) 1 % IJ SOLN
INTRAMUSCULAR | Status: DC | PRN
  Administered 2019-05-17: 15 mL

## 2019-05-17 MED ORDER — CLOPIDOGREL BISULFATE 75 MG PO TABS: 75.0000 mg | ORAL_TABLET | Freq: Every day | ORAL | 11 refills | Status: DC

## 2019-05-17 MED ORDER — SODIUM CHLORIDE 0.9% FLUSH: 3.0000 mL | INTRAVENOUS | Status: DC | PRN

## 2019-05-17 MED ORDER — HEPARIN SODIUM (PORCINE) 1000 UNIT/ML IJ SOLN
INTRAMUSCULAR | Status: AC
  Filled 2019-05-17: qty 1

## 2019-05-17 MED ORDER — LIDOCAINE HCL (PF) 1 % IJ SOLN
INTRAMUSCULAR | Status: AC
  Filled 2019-05-17: qty 30

## 2019-05-17 MED ORDER — ACETAMINOPHEN 325 MG PO TABS: 650.0000 mg | ORAL_TABLET | ORAL | Status: DC | PRN

## 2019-05-17 MED ORDER — IODIXANOL 320 MG/ML IV SOLN
INTRAVENOUS | Status: DC | PRN
  Administered 2019-05-17: 50 mL via INTRA_ARTERIAL

## 2019-05-17 MED ORDER — LABETALOL HCL 5 MG/ML IV SOLN
INTRAVENOUS | Status: AC
  Filled 2019-05-17: qty 4

## 2019-05-17 MED ORDER — ONDANSETRON HCL 4 MG/2ML IJ SOLN: 4.0000 mg | Freq: Four times a day (QID) | INTRAMUSCULAR | Status: DC | PRN

## 2019-05-17 MED ORDER — HEPARIN (PORCINE) IN NACL 1000-0.9 UT/500ML-% IV SOLN
INTRAVENOUS | Status: DC | PRN
  Administered 2019-05-17 (×2): 500 mL

## 2019-05-17 MED ORDER — SODIUM CHLORIDE 0.9 % WEIGHT BASED INFUSION: 1.0000 mL/kg/h | INTRAVENOUS | Status: DC

## 2019-05-17 MED ORDER — HYDRALAZINE HCL 20 MG/ML IJ SOLN: 5.0000 mg | INTRAMUSCULAR | Status: DC | PRN

## 2019-05-17 MED ORDER — SODIUM CHLORIDE 0.9 % IV SOLN: 250.0000 mL | INTRAVENOUS | Status: DC | PRN

## 2019-05-17 SURGICAL SUPPLY — 12 items
CATH ANGIO 5F BER2 100CM (CATHETERS) ×1 IMPLANT
CATH ANGIO 5F PIGTAIL 100CM (CATHETERS) ×1 IMPLANT
KIT MICROPUNCTURE NIT STIFF (SHEATH) ×1 IMPLANT
KIT PV (KITS) ×2 IMPLANT
SHEATH PINNACLE 5F 10CM (SHEATH) ×1 IMPLANT
SHEATH PROBE COVER 6X72 (BAG) ×1 IMPLANT
STOPCOCK MORSE 400PSI 3WAY (MISCELLANEOUS) ×1 IMPLANT
SYR MEDRAD MARK V 150ML (SYRINGE) ×1 IMPLANT
TRANSDUCER W/STOPCOCK (MISCELLANEOUS) ×2 IMPLANT
TRAY PV CATH (CUSTOM PROCEDURE TRAY) ×2 IMPLANT
WIRE BENTSON .035X145CM (WIRE) ×1 IMPLANT
WIRE TORQFLEX AUST .018X40CM (WIRE) ×1 IMPLANT

## 2019-05-17 NOTE — Discharge Instructions (Signed)

## 2019-05-17 NOTE — Progress Notes (Signed)
Patient ID: [name], female   DOB: 11/24/1930, 85 y.o.   MRN: [id number] Reviewed the cerebral arteriogram with Dr. Donzetta Matters and discussed with the patient.  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  Does have calcification at the carotid bifurcation with no evidence of critical stenosis.  I discussed options with [name].  She had not been on antiplatelet agents prior to her episodes of amaurosis.  Explained the magnitude of surgical correction of her innominate disease which would require extensive surgery with sternotomy and arch surgery.  I certainly would recommend against this with her advanced age of 26 and her episodes of amaurosis only.  Recommend dual antiplatelet therapy and close follow-up  Explained that we would add Plavix to her aspirin 81 mg.  We will see her again in 3 months.  If she has persistent neurologic deficits, would consider more aggressive revascularization.

## 2019-05-17 NOTE — Op Note (Signed)
    Patient name: [name] MRN: [id number] DOB: Oct 14, 1930 Sex: female  05/17/2019 Pre-operative Diagnosis: Symptomatic right carotid stenosis Post-operative diagnosis:  Same Surgeon:  Erlene Quan C. Donzetta Matters, MD Procedure Performed: 1.  Ultrasound-guided cannulation right common femoral artery 2.  Arch aortogram 3.  Selection of right innominate artery and limited right carotid and upper extremity angiography  Indications: 85 year old female has had amaurosis fugax.  On CT scan she has heavily calcified innominate artery as well as proximal ICA lesion.  She is now indicated for arch angiography with selection of the right carotid and further angiographic visualization of her lesions.  Findings: The ICA lesion is focal approximately 50%.  There is a heavy calcification of the innominate artery.  Subclavian artery itself appears free of disease proximal common carotid artery is also heavily calcified.  Is difficult to determine the exact degree of stenosis given heavy calcification.  Takeoff of the innominate artery is quite diminutive.   Procedure:  The patient was identified in the holding area and taken to room 8.  The patient was then placed supine on the table and prepped and draped in the usual sterile fashion.  A time out was called.  Ultrasound was used to evaluate the right common femoral artery.  This was cannulated with micropuncture needle followed the wire and sheath.  And images saved the permanent record.  We placed a 5 French sheath over Bentson wire.  She was given 5000 units of heparin.  We then placed a pigtail over wire to the arch of the aorta.  We performed arch aortogram.  We used very catheter to select the innominate artery.  I then performed selected hand shots in several angles of the innominate and carotid arteries.  I then removed the catheter over wire.  Sheath will be pulled in postoperative holding.  She tolerated procedure well immediate complication.  Contrast: 50cc   Stasia Somero C. Donzetta Matters, MD V[facility] and Vein Specialists of [place] Office: [phone] Pager: [phone]

## 2019-05-17 NOTE — H&P (Signed)
   History and Physical Update  The patient was interviewed and re-examined.  The patient's previous History and Physical has been reviewed and is unchanged from recent office visit. Plan for arch aortogram and diagnostic carotid angiogram.  Harnoor Reta C. Donzetta Matters, MD [facility] Specialists of [place] Office: [phone] Pager: [phone]   05/17/2019, 7:13 AM

## 2019-05-17 NOTE — Progress Notes (Signed)
Site area: right groin a 5 french arterial sheath was removed  Site Prior to Removal:  Level 0  Pressure Applied For 20 MINUTES    Bedrest Beginning at 0920am  Manual:   Yes.    Patient Status During Pull:  stable  Post Pull Groin Site:  Level 0  Post Pull Instructions Given:  Yes.    Post Pull Pulses Present:  Yes.    Dressing Applied:  Yes.    Comments:  Labetalol 10 mg given for BP

## 2019-05-18 ENCOUNTER — Encounter (HOSPITAL_COMMUNITY): Payer: Self-pay | Admitting: Vascular Surgery

## 2019-08-17 ENCOUNTER — Ambulatory Visit: Payer: Medicare Other | Admitting: Vascular Surgery

## 2019-08-19 ENCOUNTER — Ambulatory Visit: Payer: Medicare Other | Attending: Internal Medicine

## 2019-08-19 DIAGNOSIS — Z23 Encounter for immunization: Secondary | ICD-10-CM | POA: Insufficient documentation

## 2019-08-19 NOTE — Progress Notes (Signed)
   Covid-19 Vaccination Clinic  Name:  [name]    MRN: [id number] DOB: September 21, 1930  08/19/2019  [name] was observed post Covid-19 immunization for 15 minutes without incidence. She was provided with Vaccine Information Sheet and instruction to access the V-Safe system.   [name] was instructed to call 911 with any severe reactions post vaccine: Marland Kitchen Difficulty breathing  . Swelling of your face and throat  . A fast heartbeat  . A bad rash all over your body  . Dizziness and weakness    Immunizations Administered    Name Date Dose VIS Date Route   Pfizer COVID-19 Vaccine 08/19/2019  1:41 PM 0.3 mL 06/04/2019 Intramuscular   Manufacturer: [employer], [employer]   Lot: [id number]   NDC: [address]-8

## 2019-09-07 ENCOUNTER — Ambulatory Visit (INDEPENDENT_AMBULATORY_CARE_PROVIDER_SITE_OTHER): Payer: Medicare Other | Admitting: Vascular Surgery

## 2019-09-07 ENCOUNTER — Encounter: Payer: Self-pay | Admitting: Vascular Surgery

## 2019-09-07 ENCOUNTER — Other Ambulatory Visit: Payer: Self-pay

## 2019-09-07 VITALS — BP 100/69 | HR 92 | Temp 97.3°F | Resp 18 | Ht 65.0 in | Wt 174.0 lb

## 2019-09-07 DIAGNOSIS — G453 Amaurosis fugax: Secondary | ICD-10-CM | POA: Diagnosis not present

## 2019-09-07 NOTE — Progress Notes (Signed)
[facility] [place]  Patient name: [name] MRN: [id number] DOB: [birth year] Sex: female  REASON FOR VISIT: Continued discussion of supra aortic trunk disease  HPI: [name] is a 86 y.o. female here for follow-up.  She had been seen approximately 4 months ago with an episode of right amaurosis fugax.  Work-up revealed moderate carotid disease.  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  She subsequently underwent formal arteriography showing severe innominate disease with calcification and also disease of her proximal common carotid artery.  She had moderate bifurcation disease.  I had discussed at that time magnitude of correcting her innominate stenosis would require very large invasive procedure.  She had not been on antiplatelets and was started on aspirin and Plavix.  She is here today for further discussion.  She remains quite active and independent.  She specifically denies any new visual changes.  She also denies any focal weakness.  She does report occasional headache and I explained to be highly unlikely that this was related to occlusive disease  Past Medical History:  Diagnosis Date  . Allergy   . Anemia   . Anxiety   . Arthritis   . Blood transfusion   . Blood transfusion without reported diagnosis   . Bronchiectasis (HCC)   . Cataract    surgery 2008  . Depression   . Diabetes mellitus without complication (HCC)    no meds, diet controlled  . Diverticulosis   . DIVERTICULOSIS OF COLON 03/10/2008   Qualifier: Diagnosis of  By: Nelson-Smith CMA (AAMA), Dottie    . Esophageal dysmotility   . Esophageal reflux   . Esophageal stricture   . H. pylori infection   . Hiatal hernia   . PUD (peptic ulcer disease)   . Renal cyst   . RENAL CYST 03/01/2010   Stable over course of one year. No further followup    . Stress incontinence     . Unspecified essential hypertension     Family History  Problem Relation Age of Onset  . Diabetes Mother        aunts  . Heart disease Maternal Aunt   . Diabetes Maternal Aunt   . Kidney disease Maternal Aunt   . Stroke Maternal Grandmother   . Colon cancer Neg Hx   . Esophageal cancer Neg Hx   . Rectal cancer Neg Hx   . Stomach cancer Neg Hx   . Lung disease Neg Hx   . Rheumatologic disease Neg Hx     SOCIAL HISTORY: Social History   Tobacco Use  . Smoking status: Former Smoker    Packs/day: 0.10    Years: 40.00    Pack years: 4.00    Types: Cigarettes    Quit date: 06/24/1990    Years since quitting: 29.2  . Smokeless tobacco: Never Used  Substance Use Topics  . Alcohol use: No    Allergies  Allergen Reactions  . Nsaids Other (See Comments)    REACTION: intolerant due to reflux, history of ulcers    Current Outpatient Medications  Medication Sig Dispense Refill  . albuterol (PROVENTIL) (2.5 MG/3ML) 0.083% nebulizer solution Take 2.5 mg by nebulization every 6 (six) hours as needed for wheezing or shortness of breath.    Marland Kitchen amitriptyline (ELAVIL) 25 MG tablet Take 25 mg by mouth at bedtime.    Marland Kitchen aspirin EC 81 MG tablet Take 81  mg by mouth daily.    . AZOR 5-40 MG tablet TAKE 1 TABLET BY MOUTH DAILY. (Patient taking differently: Take 1 tablet by mouth daily. ) 30 tablet 4  . clopidogrel (PLAVIX) 75 MG tablet Take 1 tablet (75 mg total) by mouth daily. 30 tablet 11  . co-enzyme Q-10 30 MG capsule Take 30 mg by mouth daily.    . cyanocobalamin 1000 MCG tablet Take 1,000 mcg by mouth daily.     . diclofenac Sodium (VOLTAREN) 1 % GEL Apply 2 g topically 4 (four) times daily.    . DULoxetine (CYMBALTA) 60 MG capsule Take 1 capsule (60 mg total) by mouth daily. 90 capsule 0  . esomeprazole (NEXIUM) 40 MG capsule Take 1 capsule by mouth  every morning (Patient taking differently: Take 40 mg by mouth daily. ) 90 capsule 3  . ferrous sulfate 325 (65 FE) MG tablet Take 325  mg by mouth daily with breakfast.     . fluticasone furoate-vilanterol (BREO ELLIPTA) 100-25 MCG/INH AEPB Inhale 1 puff into the lungs daily.    Marland Kitchen gabapentin (NEURONTIN) 300 MG capsule Take 1 capsule (300 mg total) by mouth 2 (two) times daily. 90 capsule 1  . gabapentin (NEURONTIN) 400 MG capsule Take 400 mg by mouth 2 (two) times daily.    Marland Kitchen HYDROcodone-acetaminophen (NORCO/VICODIN) 5-325 MG per tablet Take 1 tablet by mouth 2 (two) times daily.     Marland Kitchen loratadine (CLARITIN) 10 MG tablet Take 10 mg by mouth daily.    . meclizine (ANTIVERT) 12.5 MG tablet Take 12.5 mg by mouth 3 (three) times daily as needed for dizziness.    . Multiple Vitamin (MULTIVITAMIN WITH MINERALS) TABS Take 1 tablet by mouth every morning.     . prednisoLONE acetate (PRED FORTE) 1 % ophthalmic suspension Place 1 drop into both eyes 2 (two) times daily as needed. For eye irritation 5 mL 0  . PROAIR HFA 108 (90 Base) MCG/ACT inhaler Inhale 2 puffs into the lungs every 6 (six) hours as needed for wheezing or shortness of breath.     . senna (SENOKOT) 8.6 MG tablet Take 2 tablets by mouth daily.     . simvastatin (ZOCOR) 40 MG tablet Take 40 mg by mouth daily.    . solifenacin (VESICARE) 10 MG tablet Take 10 mg by mouth daily.    . sucralfate (CARAFATE) 1 G tablet TAKE 1 TABLET (1 GRAM) 4 TIMES A DAY (BETWEEN MEALS AND AT BEDTIME) (Patient taking differently: Take 1 g by mouth 4 (four) times daily -  with meals and at bedtime. ) 120 tablet 1  . triamcinolone (NASACORT ALLERGY 24HR) 55 MCG/ACT AERO nasal inhaler Place 2 sprays into the nose daily.    Marland Kitchen XIIDRA 5 % SOLN Apply 1 drop to eye 2 (two) times daily.     No current facility-administered medications for this visit.    REVIEW OF SYSTEMS:  [X]  denotes positive finding, [ ]  denotes negative finding Cardiac  Comments:  Chest pain or chest pressure:    Shortness of breath upon exertion:    Short of breath when lying flat:    Irregular heart rhythm:        Vascular      Pain in calf, thigh, or hip brought on by ambulation:    Pain in feet at night that wakes you up from your sleep:     Blood clot in your veins:    Leg swelling:  PHYSICAL EXAM: Vitals:   09/07/19 1448  BP: 100/69  Pulse: 92  Resp: 18  Temp: (!) 97.3 F (36.3 C)  SpO2: 96%  Weight: 78.9 kg  Height: 5\' 5"  (1.651 m)    GENERAL: The patient is a well-nourished female, in no acute distress. The vital signs are documented above. CARDIOVASCULAR: She does have a harsh right carotid bruit and no bruit on the left PULMONARY: There is good air exchange  MUSCULOSKELETAL: There are no major deformities or cyanosis. NEUROLOGIC: No focal weakness or paresthesias are detected. SKIN: There are no ulcers or rashes noted. PSYCHIATRIC: The patient has a normal affect.  DATA:  No new data.  Carotid duplex, CT angiogram and formal catheter-based arteriography discussed above  MEDICAL ISSUES: Patient with known proximal aortic and innominate calcified stenosis.  Fortunately she has had no new symptoms on dual antiplatelet therapy.  I recommend that she continue this lifelong.  She will notify should she develop any new visual changes or focal deficits.  I do not foot feel that there is any role for surveillance carotid duplex since this would not change any treatment due to her proximal disease.  She understands this and will notify should she develop any new neurologic difficulties    [place], MD [facility] Tel [phone] Pager [phone]

## 2019-09-14 ENCOUNTER — Ambulatory Visit: Payer: Medicare Other | Attending: Internal Medicine

## 2019-09-14 DIAGNOSIS — Z23 Encounter for immunization: Secondary | ICD-10-CM

## 2019-09-14 NOTE — Progress Notes (Signed)
   Covid-19 Vaccination Clinic  Name:  [name]    MRN: [id number] DOB: 08/18/1930  09/14/2019  [name] was observed post Covid-19 immunization for 15 minutes without incident. She was provided with Vaccine Information Sheet and instruction to access the V-Safe system.   [name] was instructed to call 911 with any severe reactions post vaccine: Marland Kitchen Difficulty breathing  . Swelling of face and throat  . A fast heartbeat  . A bad rash all over body  . Dizziness and weakness   Immunizations Administered    Name Date Dose VIS Date Route   Pfizer COVID-19 Vaccine 09/14/2019  3:30 PM 0.3 mL 06/04/2019 Intramuscular   Manufacturer: [employer], [employer]   Lot: [id number]   NDC: [address]-7

## 2020-04-28 ENCOUNTER — Other Ambulatory Visit: Payer: Self-pay | Admitting: Physician Assistant

## 2021-01-19 ENCOUNTER — Other Ambulatory Visit: Payer: Self-pay | Admitting: Internal Medicine

## 2021-01-20 LAB — CBC
HCT: 37.5 % (ref 35.0–45.0)
Hemoglobin: 12 g/dL (ref 11.7–15.5)
MCH: 28 pg (ref 27.0–33.0)
MCHC: 32 g/dL (ref 32.0–36.0)
MCV: 87.4 fL (ref 80.0–100.0)
MPV: 9.6 fL (ref 7.5–12.5)
Platelets: 314 10*3/uL (ref 140–400)
RBC: 4.29 10*6/uL (ref 3.80–5.10)
RDW: 14.3 % (ref 11.0–15.0)
WBC: 6.1 10*3/uL (ref 3.8–10.8)

## 2021-01-20 LAB — COMPLETE METABOLIC PANEL WITH GFR
AG Ratio: 1.5 (calc) (ref 1.0–2.5)
ALT: 11 U/L (ref 6–29)
AST: 17 U/L (ref 10–35)
Albumin: 4.3 g/dL (ref 3.6–5.1)
Alkaline phosphatase (APISO): 71 U/L (ref 37–153)
BUN/Creatinine Ratio: 13 (calc) (ref 6–22)
BUN: 14 mg/dL (ref 7–25)
CO2: 24 mmol/L (ref 20–32)
Calcium: 9.7 mg/dL (ref 8.6–10.4)
Chloride: 103 mmol/L (ref 98–110)
Creat: 1.04 mg/dL — ABNORMAL HIGH (ref 0.60–0.95)
Globulin: 2.8 g/dL (calc) (ref 1.9–3.7)
Glucose, Bld: 90 mg/dL (ref 65–99)
Potassium: 4.2 mmol/L (ref 3.5–5.3)
Sodium: 138 mmol/L (ref 135–146)
Total Bilirubin: 0.4 mg/dL (ref 0.2–1.2)
Total Protein: 7.1 g/dL (ref 6.1–8.1)
eGFR: 52 mL/min/{1.73_m2} — ABNORMAL LOW (ref >=60)

## 2021-01-20 LAB — TSH: TSH: 0.73 mIU/L (ref 0.40–4.50)

## 2021-03-27 ENCOUNTER — Other Ambulatory Visit: Payer: Self-pay | Admitting: Vascular Surgery

## 2021-05-14 ENCOUNTER — Other Ambulatory Visit: Payer: Self-pay | Admitting: Internal Medicine

## 2021-05-16 LAB — CBC WITH DIFFERENTIAL/PLATELET
Absolute Monocytes: 394 cells/uL (ref 200–950)
Basophils Absolute: 80 cells/uL (ref 0–200)
Basophils Relative: 1.1 %
Eosinophils Absolute: 475 cells/uL (ref 15–500)
Eosinophils Relative: 6.5 %
HCT: 35.4 % (ref 35.0–45.0)
Hemoglobin: 11.6 g/dL — ABNORMAL LOW (ref 11.7–15.5)
Lymphs Abs: 2139 cells/uL (ref 850–3900)
MCH: 28.6 pg (ref 27.0–33.0)
MCHC: 32.8 g/dL (ref 32.0–36.0)
MCV: 87.2 fL (ref 80.0–100.0)
MPV: 9.7 fL (ref 7.5–12.5)
Monocytes Relative: 5.4 %
Neutro Abs: 4212 cells/uL (ref 1500–7800)
Neutrophils Relative %: 57.7 %
Platelets: 327 10*3/uL (ref 140–400)
RBC: 4.06 10*6/uL (ref 3.80–5.10)
RDW: 14.7 % (ref 11.0–15.0)
Total Lymphocyte: 29.3 %
WBC: 7.3 10*3/uL (ref 3.8–10.8)

## 2021-05-16 LAB — COMPLETE METABOLIC PANEL WITH GFR
AG Ratio: 1.6 (calc) (ref 1.0–2.5)
ALT: 12 U/L (ref 6–29)
AST: 20 U/L (ref 10–35)
Albumin: 4.5 g/dL (ref 3.6–5.1)
Alkaline phosphatase (APISO): 82 U/L (ref 37–153)
BUN/Creatinine Ratio: 14 (calc) (ref 6–22)
BUN: 17 mg/dL (ref 7–25)
CO2: 25 mmol/L (ref 20–32)
Calcium: 10.3 mg/dL (ref 8.6–10.4)
Chloride: 101 mmol/L (ref 98–110)
Creat: 1.18 mg/dL — ABNORMAL HIGH (ref 0.60–0.95)
Globulin: 2.8 g/dL (calc) (ref 1.9–3.7)
Glucose, Bld: 108 mg/dL — ABNORMAL HIGH (ref 65–99)
Potassium: 4.4 mmol/L (ref 3.5–5.3)
Sodium: 137 mmol/L (ref 135–146)
Total Bilirubin: 0.2 mg/dL (ref 0.2–1.2)
Total Protein: 7.3 g/dL (ref 6.1–8.1)
eGFR: 44 mL/min/{1.73_m2} — ABNORMAL LOW (ref >=60)

## 2021-05-16 LAB — MAGNESIUM: Magnesium: 2.1 mg/dL (ref 1.5–2.5)

## 2021-05-16 LAB — FOLATE: Folate: >24 ng/mL

## 2021-05-16 LAB — VITAMIN B12: Vitamin B-12: >2000 pg/mL — ABNORMAL HIGH (ref 200–1100)

## 2022-01-23 ENCOUNTER — Other Ambulatory Visit: Payer: Self-pay | Admitting: Vascular Surgery

## 2022-01-28 ENCOUNTER — Other Ambulatory Visit: Payer: Self-pay | Admitting: Internal Medicine

## 2022-01-29 LAB — CBC
HCT: 32.9 % — ABNORMAL LOW (ref 35.0–45.0)
Hemoglobin: 10.9 g/dL — ABNORMAL LOW (ref 11.7–15.5)
MCH: 28.3 pg (ref 27.0–33.0)
MCHC: 33.1 g/dL (ref 32.0–36.0)
MCV: 85.5 fL (ref 80.0–100.0)
MPV: 9.1 fL (ref 7.5–12.5)
Platelets: 349 10*3/uL (ref 140–400)
RBC: 3.85 10*6/uL (ref 3.80–5.10)
RDW: 14.3 % (ref 11.0–15.0)
WBC: 5.9 10*3/uL (ref 3.8–10.8)

## 2022-01-29 LAB — COMPLETE METABOLIC PANEL WITH GFR
AG Ratio: 1.5 (calc) (ref 1.0–2.5)
ALT: 14 U/L (ref 6–29)
AST: 17 U/L (ref 10–35)
Albumin: 4.2 g/dL (ref 3.6–5.1)
Alkaline phosphatase (APISO): 63 U/L (ref 37–153)
BUN/Creatinine Ratio: 12 (calc) (ref 6–22)
BUN: 16 mg/dL (ref 7–25)
CO2: 23 mmol/L (ref 20–32)
Calcium: 9.8 mg/dL (ref 8.6–10.4)
Chloride: 99 mmol/L (ref 98–110)
Creat: 1.36 mg/dL — ABNORMAL HIGH (ref 0.60–0.95)
Globulin: 2.8 g/dL (calc) (ref 1.9–3.7)
Glucose, Bld: 100 mg/dL — ABNORMAL HIGH (ref 65–99)
Potassium: 4.6 mmol/L (ref 3.5–5.3)
Sodium: 135 mmol/L (ref 135–146)
Total Bilirubin: 0.3 mg/dL (ref 0.2–1.2)
Total Protein: 7 g/dL (ref 6.1–8.1)
eGFR: 37 mL/min/{1.73_m2} — ABNORMAL LOW (ref >=60)

## 2022-01-29 LAB — LIPID PANEL
Cholesterol: 147 mg/dL (ref <200)
HDL: 63 mg/dL (ref >=50)
LDL Cholesterol (Calc): 66 mg/dL (calc)
Non-HDL Cholesterol (Calc): 84 mg/dL (calc) (ref <130)
Total CHOL/HDL Ratio: 2.3 (calc) (ref <5.0)
Triglycerides: 96 mg/dL (ref <150)

## 2022-01-29 LAB — TSH: TSH: 1.03 mIU/L (ref 0.40–4.50)

## 2022-05-13 ENCOUNTER — Emergency Department (HOSPITAL_COMMUNITY): Payer: Medicare Other

## 2022-05-13 ENCOUNTER — Emergency Department (HOSPITAL_COMMUNITY)
Admission: EM | Admit: 2022-05-13 | Discharge: 2022-05-13 | Disposition: A | Discharge status: Home / Self Care | Payer: Medicare Other | Attending: Emergency Medicine | Admitting: Emergency Medicine

## 2022-05-13 DIAGNOSIS — E119 Type 2 diabetes mellitus without complications: Secondary | ICD-10-CM | POA: Diagnosis not present

## 2022-05-13 DIAGNOSIS — Z87891 Personal history of nicotine dependence: Secondary | ICD-10-CM | POA: Insufficient documentation

## 2022-05-13 DIAGNOSIS — Z23 Encounter for immunization: Secondary | ICD-10-CM | POA: Diagnosis not present

## 2022-05-13 DIAGNOSIS — Z7982 Long term (current) use of aspirin: Secondary | ICD-10-CM | POA: Diagnosis not present

## 2022-05-13 DIAGNOSIS — S62114A Nondisplaced fracture of triquetrum [cuneiform] bone, right wrist, initial encounter for closed fracture: Secondary | ICD-10-CM | POA: Diagnosis not present

## 2022-05-13 DIAGNOSIS — W1830XA Fall on same level, unspecified, initial encounter: Secondary | ICD-10-CM | POA: Diagnosis not present

## 2022-05-13 DIAGNOSIS — S60911A Unspecified superficial injury of right wrist, initial encounter: Secondary | ICD-10-CM | POA: Diagnosis present

## 2022-05-13 DIAGNOSIS — S0990XA Unspecified injury of head, initial encounter: Secondary | ICD-10-CM

## 2022-05-13 DIAGNOSIS — S0083XA Contusion of other part of head, initial encounter: Secondary | ICD-10-CM | POA: Diagnosis not present

## 2022-05-13 LAB — CBC
HCT: 36.7 % (ref 36.0–46.0)
Hemoglobin: 12.1 g/dL (ref 12.0–15.0)
MCH: 28.9 pg (ref 26.0–34.0)
MCHC: 33 g/dL (ref 30.0–36.0)
MCV: 87.6 fL (ref 80.0–100.0)
Platelets: 344 10*3/uL (ref 150–400)
RBC: 4.19 MIL/uL (ref 3.87–5.11)
RDW: 13.9 % (ref 11.5–15.5)
WBC: 7.1 10*3/uL (ref 4.0–10.5)
nRBC: 0 % (ref 0.0–0.2)

## 2022-05-13 LAB — BASIC METABOLIC PANEL
Anion gap: 12 (ref 5–15)
BUN: 17 mg/dL (ref 8–23)
CO2: 21 mmol/L — ABNORMAL LOW (ref 22–32)
Calcium: 10.1 mg/dL (ref 8.9–10.3)
Chloride: 101 mmol/L (ref 98–111)
Creatinine, Ser: 1.86 mg/dL — ABNORMAL HIGH (ref 0.44–1.00)
GFR, Estimated: 26 mL/min — ABNORMAL LOW (ref >60)
Glucose, Bld: 108 mg/dL — ABNORMAL HIGH (ref 70–99)
Potassium: 4 mmol/L (ref 3.5–5.1)
Sodium: 134 mmol/L — ABNORMAL LOW (ref 135–145)

## 2022-05-13 LAB — CBG MONITORING, ED: Glucose-Capillary: 109 mg/dL — ABNORMAL HIGH (ref 70–99)

## 2022-05-13 MED ORDER — TETANUS-DIPHTH-ACELL PERTUSSIS 5-2.5-18.5 LF-MCG/0.5 IM SUSY
0.5000 mL | PREFILLED_SYRINGE | Freq: Once | INTRAMUSCULAR | Status: AC
  Administered 2022-05-13: 0.5 mL via INTRAMUSCULAR
  Filled 2022-05-13: qty 0.5

## 2022-05-13 NOTE — ED Triage Notes (Signed)
Patient here after tripping and falling, hitting her forehead on cement. Denies LOC, patient on plavix. Patient is alert, oriented, and in no apparent distress at this time.

## 2022-05-13 NOTE — Progress Notes (Signed)
Orthopedic Tech Progress Note Patient Details:  [name] [birth year] [id number]  Ortho Devices Type of Ortho Device: Ulna gutter splint Ortho Device/Splint Location: rue Ortho Device/Splint Interventions: Ordered, Application, Adjustment  Patient splinted with finger in extension at drs request. Post Interventions Patient Tolerated: Well Instructions Provided: Care of device, Adjustment of device  Trinna Post 05/13/2022, 8:18 PM

## 2022-05-13 NOTE — ED Notes (Signed)
Ortho tech called about short arm splint.

## 2022-05-13 NOTE — ED Notes (Signed)
Ortho tech at bedside 

## 2022-05-13 NOTE — ED Provider Notes (Signed)
[facility] EMERGENCY DEPARTMENT Provider Note   CSN: [id number] Arrival date & time: 05/13/22  1719     History  No chief complaint on file.   [name] is a 88 y.o. female.  Patient as above with significant medical history as below, including DM, depression, diverticulosis, PUD, anemia who presents to the ED with complaint of fall. Pt tripped over her cane earlier today while leaving a business, fell forward on to the ground and struck her forehead on the cement sidewalk. Also has pain to her right wrist. No LOC, felt "dazed" after the fall and was helped up by family. No n/v, no cp or dib, no numbness or tingling, she has mild HA. Unsure of last tetanus. Last plavix was this AM. Normal state of health prior to the fall.     Past Medical History:  Diagnosis Date   Allergy    Anemia    Anxiety    Arthritis    Blood transfusion    Blood transfusion without reported diagnosis    Bronchiectasis ([place])    Cataract    surgery 2008   Depression    Diabetes mellitus without complication ([place])    no meds, diet controlled   Diverticulosis    DIVERTICULOSIS OF COLON 03/10/2008   Qualifier: Diagnosis of  By: Harlon Ditty CMA (AAMA), Dottie     Esophageal dysmotility    Esophageal reflux    Esophageal stricture    H. pylori infection    Hiatal hernia    PUD (peptic ulcer disease)    Renal cyst    RENAL CYST 03/01/2010   Stable over course of one year. No further followup     Stress incontinence    Unspecified essential hypertension     Past Surgical History:  Procedure Laterality Date   ABDOMINAL HYSTERECTOMY     APPENDECTOMY     CAROTID ANGIOGRAPHY Right 05/17/2019   Procedure: CAROTID ANGIOGRAPHY;  Surgeon: Waynetta Sandy, MD;  Location: [facility];  Service: Cardiovascular;  Laterality: Right;   CATARACT EXTRACTION, BILATERAL     COLONOSCOPY     KNEE ARTHROPLASTY  11/01/2011   Procedure: COMPUTER ASSISTED TOTAL KNEE ARTHROPLASTY;   Surgeon: Alta Corning, MD;  Location: [facility];  Service: Orthopedics;  Laterality: Left;  GENERAL ANESTHESIA WITH PRE OP FEMORAL NERVE BLOCK   OOPHORECTOMY     right knee replacement     ROTATOR CUFF REPAIR     right side, left-unknown repair   UPPER GASTROINTESTINAL ENDOSCOPY       The history is provided by the patient and a relative. No language interpreter was used.       Home Medications Prior to Admission medications   Medication Sig Start Date End Date Taking? Authorizing Provider  albuterol (PROVENTIL) (2.5 MG/3ML) 0.083% nebulizer solution Take 2.5 mg by nebulization every 6 (six) hours as needed for wheezing or shortness of breath.    [provider]  amitriptyline (ELAVIL) 25 MG tablet Take 25 mg by mouth at bedtime. 04/29/19   [provider]  aspirin EC 81 MG tablet Take 81 mg by mouth daily.    [provider]  AZOR 5-40 MG tablet TAKE 1 TABLET BY MOUTH DAILY. Patient taking differently: Take 1 tablet by mouth daily.  04/17/15   Marin Olp, MD  clopidogrel (PLAVIX) 75 MG tablet TAKE 1 TABLET BY MOUTH EVERY DAY 01/25/22   Angelia Mould, MD  co-enzyme Q-10 30 MG capsule Take  30 mg by mouth daily.    [provider]  cyanocobalamin 1000 MCG tablet Take 1,000 mcg by mouth daily.     [provider]  diclofenac Sodium (VOLTAREN) 1 % GEL Apply 2 g topically 4 (four) times daily.    [provider]  DULoxetine (CYMBALTA) 60 MG capsule Take 1 capsule (60 mg total) by mouth daily. 04/20/14   Marin Olp, MD  esomeprazole (NEXIUM) 40 MG capsule Take 1 capsule by mouth  every morning Patient taking differently: Take 40 mg by mouth daily.  03/11/16   Mauri Pole, MD  ferrous sulfate 325 (65 FE) MG tablet Take 325 mg by mouth daily with breakfast.     [provider]  fluticasone furoate-vilanterol (BREO ELLIPTA) 100-25 MCG/INH AEPB Inhale 1 puff into the lungs daily.    [provider]   gabapentin (NEURONTIN) 300 MG capsule Take 1 capsule (300 mg total) by mouth 2 (two) times daily. 04/06/14   Marin Olp, MD  gabapentin (NEURONTIN) 400 MG capsule Take 400 mg by mouth 2 (two) times daily.    [provider]  HYDROcodone-acetaminophen (NORCO/VICODIN) 5-325 MG per tablet Take 1 tablet by mouth 2 (two) times daily.     [provider]  loratadine (CLARITIN) 10 MG tablet Take 10 mg by mouth daily.    [provider]  meclizine (ANTIVERT) 12.5 MG tablet Take 12.5 mg by mouth 3 (three) times daily as needed for dizziness.    [provider]  Multiple Vitamin (MULTIVITAMIN WITH MINERALS) TABS Take 1 tablet by mouth every morning.     [provider]  prednisoLONE acetate (PRED FORTE) 1 % ophthalmic suspension Place 1 drop into both eyes 2 (two) times daily as needed. For eye irritation 04/20/14   Marin Olp, MD  PROAIR HFA 108 [phone] Base) MCG/ACT inhaler Inhale 2 puffs into the lungs every 6 (six) hours as needed for wheezing or shortness of breath.  02/12/16   [provider]  senna (SENOKOT) 8.6 MG tablet Take 2 tablets by mouth daily.     [provider]  simvastatin (ZOCOR) 40 MG tablet Take 40 mg by mouth daily.    [provider]  solifenacin (VESICARE) 10 MG tablet Take 10 mg by mouth daily.    [provider]  sucralfate (CARAFATE) 1 G tablet TAKE 1 TABLET (1 GRAM) 4 TIMES A DAY (BETWEEN MEALS AND AT BEDTIME) Patient taking differently: Take 1 g by mouth 4 (four) times daily -  with meals and at bedtime.  08/15/14   Marin Olp, MD  triamcinolone (NASACORT ALLERGY 24HR) 55 MCG/ACT AERO nasal inhaler Place 2 sprays into the nose daily.    [provider]  XIIDRA 5 % SOLN Apply 1 drop to eye 2 (two) times daily. 05/04/19   [provider]      Allergies    Nsaids    Review of Systems   Review of Systems  Constitutional:  Negative for activity change and fever.   HENT:  Negative for facial swelling and trouble swallowing.   Eyes:  Negative for discharge and redness.  Respiratory:  Negative for cough and shortness of breath.   Cardiovascular:  Negative for chest pain and palpitations.  Gastrointestinal:  Negative for abdominal pain and nausea.  Genitourinary:  Negative for dysuria and flank pain.  Musculoskeletal:  Positive for arthralgias. Negative for back pain and gait problem.  Skin:  Negative for pallor and rash.  Neurological:  Positive for headaches. Negative for syncope.    Physical Exam Updated Vital Signs BP (!) 148/74   Pulse 79   Temp 98.6 F (37 C) (Oral)   Resp 15   Ht 5\' 5"  (1.651 m)   Wt 78.9 kg   SpO2 96%   BMI 28.95 kg/m  Physical Exam Vitals and nursing note reviewed.  Constitutional:      General: She is not in acute distress.    Appearance: Normal appearance.  HENT:     Head: Normocephalic. Abrasion present. No raccoon eyes or Battle's sign.     Jaw: There is normal jaw occlusion. No trismus.     Comments: Hematoma forehead w/ abrasion    Right Ear: External ear normal.     Left Ear: External ear normal.     Nose: Nose normal.     Mouth/Throat:     Mouth: Mucous membranes are moist.  Eyes:     General: No scleral icterus.       Right eye: No discharge.        Left eye: No discharge.     Extraocular Movements: Extraocular movements intact.     Pupils: Pupils are equal, round, and reactive to light.  Cardiovascular:     Rate and Rhythm: Normal rate and regular rhythm.     Pulses: Normal pulses.     Heart sounds: Normal heart sounds.  Pulmonary:     Effort: Pulmonary effort is normal. No tachypnea, accessory muscle usage or respiratory distress.     Breath sounds: Normal breath sounds.  Abdominal:     General: Abdomen is flat.     Tenderness: There is no abdominal tenderness.  Musculoskeletal:        General: Normal range of motion.       Arms:     Cervical back: Full passive range of motion without  pain and normal range of motion.     Right lower leg: No edema.     Left lower leg: No edema.     Comments: No midline spinous process tenderness to palpation or percussion, no crepitus or step-off.   Pelvis stable to ap pressure  No pain w/ log roll BLLE   RUE is NVI, pain at wrist medially, no pain to ipsilateral elbow or shoulder   Skin:    General: Skin is warm and dry.     Capillary Refill: Capillary refill takes less than 2 seconds.  Neurological:     Mental Status: She is alert and oriented to person, place, and time.     GCS: GCS eye subscore is 4. GCS verbal subscore is 5. GCS motor subscore is 6.     Cranial Nerves: Cranial nerves 2-12 are intact. No dysarthria.     Sensory: Sensation is intact.     Motor: Motor function is intact. No tremor.     Coordination: Coordination is intact. Finger-Nose-Finger Test normal.  Psychiatric:        Mood and Affect: Mood normal.        Behavior: Behavior normal.     ED Results / Procedures / Treatments   Labs (all labs ordered are listed, but only abnormal results are displayed) Labs Reviewed  BASIC METABOLIC PANEL - Abnormal; Notable for the following components:      Result Value   Sodium 134 (*)    CO2 21 (*)    Glucose, Bld 108 (*)    Creatinine, Ser 1.86 (*)    GFR, Estimated  26 (*)    All other components within normal limits  CBG MONITORING, ED - Abnormal; Notable for the following components:   Glucose-Capillary 109 (*)    All other components within normal limits  CBC    EKG EKG Interpretation  Date/Time:  Monday May 13 2022 17:41:19 EST Ventricular Rate:  91 PR Interval:  150 QRS Duration: 101 QT Interval:  334 QTC Calculation: 411 R Axis:   9 Text Interpretation: Sinus rhythm Abnormal R-wave progression, early transition Nonspecific T abnormalities, diffuse leads Confirmed by Tanda Rockers ([id number]) on 05/13/2022 7:55:24 PM  Radiology DG Wrist Complete Right  Result Date: 05/13/2022 CLINICAL DATA:   Right wrist pain after fall. EXAM: RIGHT WRIST - COMPLETE 3+ VIEW COMPARISON:  None Available. FINDINGS: Bone mineral ulnar negative variance. Mildly decreased bone mineralization. Moderate chondrocalcinosis overlying the triangular fibrocartilage complex. Mild-to-moderate subacromial carpal and mild triscaphe joint space narrowing. Mild concavity of the proximal medial aspect of the lunate, appearing well corticated and chronic. Mild degenerative cysts at the distal lateral aspect of the scaphoid and the lateral aspect of the capitate. A small ossicle is seen just dorsal to the distal aspect of the proximal carpal row on lateral view. This may represent an acute triquetral fracture. There is mild diffuse wrist soft tissue swelling, especially dorsally. IMPRESSION: 1. A small ossicle is seen just dorsal to the distal aspect of the proximal carpal row on lateral view. This may represent an acute triquetral fracture. Recommend correlation for point tenderness. 2. Mild-to-moderate radiocarpal and mild triscaphe osteoarthritis. Electronically Signed   By: Neita Garnet M.D.   On: 05/13/2022 19:24   CT HEAD WO CONTRAST  Result Date: 05/13/2022 CLINICAL DATA:  Trip and fall, hit head on concrete, forehead hematoma, blood thinners EXAM: CT HEAD WITHOUT CONTRAST CT MAXILLOFACIAL WITHOUT CONTRAST CT CERVICAL SPINE WITHOUT CONTRAST TECHNIQUE: Multidetector CT imaging of the head, cervical spine, and maxillofacial structures were performed using the standard protocol without intravenous contrast. Multiplanar CT image reconstructions of the cervical spine and maxillofacial structures were also generated. RADIATION DOSE REDUCTION: This exam was performed according to the departmental dose-optimization program which includes automated exposure control, adjustment of the mA and/or kV according to patient size and/or use of iterative reconstruction technique. COMPARISON:  04/30/2019 FINDINGS: CT HEAD FINDINGS Brain: No  evidence of acute infarction, hemorrhage, hydrocephalus, extra-axial collection or mass lesion/mass effect. Vascular: No hyperdense vessel or unexpected calcification. CT FACIAL BONES FINDINGS Skull: Normal. Negative for fracture or focal lesion. Facial bones: No displaced fractures or dislocations. Sinuses/Orbits: No acute finding. Other: Frontal soft tissue hematoma (series 3, image 10). CT CERVICAL SPINE FINDINGS Alignment: Straightening of the normal cervical lordosis. Skull base and vertebrae: No acute fracture. No primary bone lesion or focal pathologic process. Soft tissues and spinal canal: No prevertebral fluid or swelling. No visible canal hematoma. 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 Upper chest: Negative. Other: None. IMPRESSION: 1. No acute intracranial pathology. 2. No displaced fractures or dislocations of the facial bones. 3. Soft tissue hematoma of the forehead. 4. No fracture or static subluxation of the cervical spine. 5. 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 Electronically Signed   By: Jearld Lesch M.D.   On: 05/13/2022 19:02   CT Cervical Spine Wo Contrast  Result Date: 05/13/2022 CLINICAL DATA:  Trip and fall, hit head on concrete, forehead hematoma, blood thinners EXAM: CT HEAD WITHOUT CONTRAST CT MAXILLOFACIAL WITHOUT  CONTRAST CT CERVICAL SPINE WITHOUT CONTRAST TECHNIQUE: Multidetector CT imaging of the head, cervical spine, and maxillofacial structures were performed using the standard protocol without intravenous contrast. Multiplanar CT image reconstructions of the cervical spine and maxillofacial structures were also generated. RADIATION DOSE REDUCTION: This exam was performed according to the departmental dose-optimization program which includes automated exposure control, adjustment of the mA and/or kV according to patient size and/or use of iterative  reconstruction technique. COMPARISON:  04/30/2019 FINDINGS: CT HEAD FINDINGS Brain: No evidence of acute infarction, hemorrhage, hydrocephalus, extra-axial collection or mass lesion/mass effect. Vascular: No hyperdense vessel or unexpected calcification. CT FACIAL BONES FINDINGS Skull: Normal. Negative for fracture or focal lesion. Facial bones: No displaced fractures or dislocations. Sinuses/Orbits: No acute finding. Other: Frontal soft tissue hematoma (series 3, image 10). CT CERVICAL SPINE FINDINGS Alignment: Straightening of the normal cervical lordosis. Skull base and vertebrae: No acute fracture. No primary bone lesion or focal pathologic process. Soft tissues and spinal canal: No prevertebral fluid or swelling. No visible canal hematoma. 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 Upper chest: Negative. Other: None. IMPRESSION: 1. No acute intracranial pathology. 2. No displaced fractures or dislocations of the facial bones. 3. Soft tissue hematoma of the forehead. 4. No fracture or static subluxation of the cervical spine. 5. 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 Electronically Signed   By: Jearld Lesch M.D.   On: 05/13/2022 19:02   CT Maxillofacial WO CM  Result Date: 05/13/2022 CLINICAL DATA:  Trip and fall, hit head on concrete, forehead hematoma, blood thinners EXAM: CT HEAD WITHOUT CONTRAST CT MAXILLOFACIAL WITHOUT CONTRAST CT CERVICAL SPINE WITHOUT CONTRAST TECHNIQUE: Multidetector CT imaging of the head, cervical spine, and maxillofacial structures were performed using the standard protocol without intravenous contrast. Multiplanar CT image reconstructions of the cervical spine and maxillofacial structures were also generated. RADIATION DOSE REDUCTION: This exam was performed according to the departmental dose-optimization program which includes automated exposure control,  adjustment of the mA and/or kV according to patient size and/or use of iterative reconstruction technique. COMPARISON:  04/30/2019 FINDINGS: CT HEAD FINDINGS Brain: No evidence of acute infarction, hemorrhage, hydrocephalus, extra-axial collection or mass lesion/mass effect. Vascular: No hyperdense vessel or unexpected calcification. CT FACIAL BONES FINDINGS Skull: Normal. Negative for fracture or focal lesion. Facial bones: No displaced fractures or dislocations. Sinuses/Orbits: No acute finding. Other: Frontal soft tissue hematoma (series 3, image 10). CT CERVICAL SPINE FINDINGS Alignment: Straightening of the normal cervical lordosis. Skull base and vertebrae: No acute fracture. No primary bone lesion or focal pathologic process. Soft tissues and spinal canal: No prevertebral fluid or swelling. No visible canal hematoma. 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 Upper chest: Negative. Other: None. IMPRESSION: 1. No acute intracranial pathology. 2. No displaced fractures or dislocations of the facial bones. 3. Soft tissue hematoma of the forehead. 4. No fracture or static subluxation of the cervical spine. 5. 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 Electronically Signed   By: Jearld Lesch M.D.   On: 05/13/2022 19:02    Procedures .Ortho Injury Treatment  Date/Time: 05/13/2022 8:37 PM  Performed by: Sloan Leiter, DO Authorized by: Sloan Leiter, DO   Consent:    Consent obtained:  Verbal   Consent given by:  Patient   Risks discussed:  Fracture   Alternatives discussed:  No  treatment and alternative treatmentInjury location: wrist Location details: right wrist Injury type: fracture Fracture type: triquetral Pre-procedure neurovascular assessment: neurovascularly intact Pre-procedure distal perfusion: normal Pre-procedure neurological function: normal Pre-procedure range  of motion: reduced  Anesthesia: Local anesthesia used: no  Patient sedated: NoManipulation performed: no Immobilization: splint Splint type: ulnar gutter Splint Applied by: Ortho Tech Supplies used: Ortho-Glass Post-procedure neurovascular assessment: post-procedure neurovascularly intact Post-procedure distal perfusion: normal Post-procedure neurological function: normal Post-procedure range of motion: unchanged   .Critical Care  Performed by: Jeanell Sparrow, DO Authorized by: Jeanell Sparrow, DO   Critical care provider statement:    Critical care time (minutes):  30   Critical care time was exclusive of:  Separately billable procedures and treating other patients   Critical care was necessary to treat or prevent imminent or life-threatening deterioration of the following conditions:  Trauma   Critical care was time spent personally by me on the following activities:  Development of treatment plan with patient or surrogate, discussions with consultants, evaluation of patient's response to treatment, examination of patient, ordering and review of laboratory studies, ordering and review of radiographic studies, ordering and performing treatments and interventions, pulse oximetry, re-evaluation of patient's condition, review of old charts and obtaining history from patient or surrogate     Medications Ordered in ED Medications  Tdap (BOOSTRIX) injection 0.5 mL (0.5 mLs Intramuscular Given 05/13/22 1925)    ED Course/ Medical Decision Making/ A&P                           Medical Decision Making Amount and/or Complexity of Data Reviewed Labs: ordered. Radiology: ordered.  Risk Prescription drug management.   This patient presents to the ED with chief complaint(s) of fall on plavix, wrist pain with pertinent past medical history of plavix use, as above which further complicates the presenting complaint. The complaint involves an extensive differential diagnosis and also  carries with it a high risk of complications and morbidity.     Differential diagnoses for head trauma includes subdural hematoma, epidural hematoma, acute concussion, traumatic subarachnoid hemorrhage, cerebral contusions, etc.  . Serious etiologies were considered.   The initial plan is to screening labs/imaging   Additional history obtained: Additional history obtained from family ems Records reviewed previous admission documents and primary care notes, prior labs/imaging  Independent labs interpretation:  The following labs were independently interpreted:  BMP with Cr mild elev from baseline, she is tolerating PO liquids normally CBC stable   Independent visualization of imaging: - I independently visualized the following imaging with scope of interpretation limited to determining acute life threatening conditions related to emergency care: ct head/c-spine/maxface, wrist XR, which revealed forehead hematoma, ?triquetral fx on right wrist   Cardiac monitoring was reviewed and interpreted by myself which shows nsr  Treatment and Reassessment: Splint Symptoms improved on re-assessment   Consultation: - Consulted or discussed management/test interpretation w/ external professional: na  Consideration for admission or further workup: Admission was considered    Patient presents with low mechanism head trauma. On initial evaluation patient appears in no acute distress, afebrile with normal vital signs. Patient has completely intact neurovascular exam, and pain improved in ED.  CT reviewed, tetanus updated.   Pt with questionable wrist fx, she is ttp to the area of abnormality on XR, splint applied, advised her to f/u with hand specialist   Encouraged rehydration with the patient, she does not want IV fluids; she is tolerating PO  w/o difficulty.    Discussed possible etiology of concussion, signs and symptoms, and discharge instructions. Detailed discussions were had with the  patient regarding current findings, and need for close f/u with PCP or on call doctor. The patient has been instructed to return immediately if the symptoms worsen in any way for re-evaluation. Patient verbalized understanding and is in agreement with current care plan. All questions answered prior to discharge  Social Determinants of health: Social History   Tobacco Use   Smoking status: Former    Packs/day: 0.10    Years: 40.00    Total pack years: 4.00    Types: Cigarettes    Quit date: 06/24/1990    Years since quitting: 31.9   Smokeless tobacco: Never  Vaping Use   Vaping Use: Never used  Substance Use Topics   Alcohol use: No   Drug use: No            Final Clinical Impression(s) / ED Diagnoses Final diagnoses:  Minor head injury, initial encounter  Closed nondisplaced fracture of triquetrum of right wrist, initial encounter    Rx / DC Orders ED Discharge Orders     None         Jeanell Sparrow, DO 05/13/22 2040

## 2022-05-13 NOTE — Discharge Instructions (Addendum)
Based on the events which brought you to the ER today, it is possible that you may have a concussion. A concussion occurs when there is a blow to the head or body, with enough force to shake the brain and disrupt how the brain functions. You may experience symptoms such as headaches, sensitivity to light/noise, dizziness, cognitive slowing, difficulty concentrating / remembering, trouble sleeping and drowsiness. These symptoms may last anywhere from hours/days to potentially weeks/months. While these symptoms are very frustrating and perhaps debilitating, it is important that you remember that they will improve over time. Everyone has a different rate of recovery; it is difficult to predict when your symptoms will resolve. In order to allow for your brain to heal after the injury, we recommend that you see your primary physician or a physician knowledgeable in concussion management. We also advise you to let your body and brain rest: avoid physical activities (sports, gym, and exercise) and reduce cognitive demands (reading, texting, TV watching, computer use, video games, etc). School attendance, after-school activities and work may need to be modified to avoid increasing symptoms. Come back to the ER right away if you are having repeated episodes of vomiting, severe/worsening headache/dizziness or any other symptom that alarms you. We recommended that someone stay with you for the next 24 hours to monitor for these worrisome symptoms.  It was a pleasure caring for you today in the emergency department.  Please return to the emergency department for any worsening or worrisome symptoms.  

## 2022-07-29 ENCOUNTER — Other Ambulatory Visit: Payer: Self-pay | Admitting: Internal Medicine

## 2022-07-30 LAB — BASIC METABOLIC PANEL WITH GFR
BUN/Creatinine Ratio: 15 (calc) (ref 6–22)
BUN: 23 mg/dL (ref 7–25)
CO2: 23 mmol/L (ref 20–32)
Calcium: 10.1 mg/dL (ref 8.6–10.4)
Chloride: 102 mmol/L (ref 98–110)
Creat: 1.49 mg/dL — ABNORMAL HIGH (ref 0.60–0.95)
Glucose, Bld: 87 mg/dL (ref 65–99)
Potassium: 4.3 mmol/L (ref 3.5–5.3)
Sodium: 136 mmol/L (ref 135–146)
eGFR: 33 mL/min/{1.73_m2} — ABNORMAL LOW (ref >=60)

## 2022-07-30 LAB — EXTRA LAV TOP TUBE

## 2022-11-14 ENCOUNTER — Other Ambulatory Visit: Payer: Self-pay | Admitting: Internal Medicine

## 2022-11-15 LAB — CBC
HCT: 33.4 % — ABNORMAL LOW (ref 35.0–45.0)
Hemoglobin: 11.1 g/dL — ABNORMAL LOW (ref 11.7–15.5)
MCH: 28.5 pg (ref 27.0–33.0)
MCHC: 33.2 g/dL (ref 32.0–36.0)
MCV: 85.9 fL (ref 80.0–100.0)
MPV: 8.5 fL (ref 7.5–12.5)
Platelets: 388 10*3/uL (ref 140–400)
RBC: 3.89 10*6/uL (ref 3.80–5.10)
RDW: 14.4 % (ref 11.0–15.0)
WBC: 7.6 10*3/uL (ref 3.8–10.8)

## 2022-11-15 LAB — URINE CULTURE
MICRO NUMBER:: 14995894
SPECIMEN QUALITY:: ADEQUATE

## 2022-11-15 LAB — COMPLETE METABOLIC PANEL WITH GFR
AG Ratio: 1.6 (calc) (ref 1.0–2.5)
ALT: 15 U/L (ref 6–29)
AST: 17 U/L (ref 10–35)
Albumin: 4.7 g/dL (ref 3.6–5.1)
Alkaline phosphatase (APISO): 65 U/L (ref 37–153)
BUN/Creatinine Ratio: 16 (calc) (ref 6–22)
BUN: 31 mg/dL — ABNORMAL HIGH (ref 7–25)
CO2: 19 mmol/L — ABNORMAL LOW (ref 20–32)
Calcium: 10.2 mg/dL (ref 8.6–10.4)
Chloride: 96 mmol/L — ABNORMAL LOW (ref 98–110)
Creat: 1.99 mg/dL — ABNORMAL HIGH (ref 0.60–0.95)
Globulin: 2.9 g/dL (calc) (ref 1.9–3.7)
Glucose, Bld: 114 mg/dL — ABNORMAL HIGH (ref 65–99)
Potassium: 4.3 mmol/L (ref 3.5–5.3)
Sodium: 127 mmol/L — ABNORMAL LOW (ref 135–146)
Total Bilirubin: 0.3 mg/dL (ref 0.2–1.2)
Total Protein: 7.6 g/dL (ref 6.1–8.1)
eGFR: 24 mL/min/{1.73_m2} — ABNORMAL LOW (ref >=60)

## 2022-11-15 LAB — TSH: TSH: 1.98 mIU/L (ref 0.40–4.50)

## 2022-11-15 LAB — LIPID PANEL
Cholesterol: 146 mg/dL (ref <200)
HDL: 59 mg/dL (ref >=50)
LDL Cholesterol (Calc): 67 mg/dL (calc)
Non-HDL Cholesterol (Calc): 87 mg/dL (calc) (ref <130)
Total CHOL/HDL Ratio: 2.5 (calc) (ref <5.0)
Triglycerides: 113 mg/dL (ref <150)

## 2022-11-28 ENCOUNTER — Other Ambulatory Visit: Payer: Self-pay | Admitting: Vascular Surgery

## 2022-12-07 ENCOUNTER — Other Ambulatory Visit: Payer: Self-pay

## 2022-12-07 ENCOUNTER — Emergency Department (HOSPITAL_COMMUNITY)
Admission: EM | Admit: 2022-12-07 | Discharge: 2022-12-07 | Disposition: A | Discharge status: Home / Self Care | Payer: 59 | Attending: Student | Admitting: Student

## 2022-12-07 ENCOUNTER — Encounter (HOSPITAL_COMMUNITY): Payer: Self-pay | Admitting: Emergency Medicine

## 2022-12-07 DIAGNOSIS — Z7982 Long term (current) use of aspirin: Secondary | ICD-10-CM | POA: Diagnosis not present

## 2022-12-07 DIAGNOSIS — I159 Secondary hypertension, unspecified: Secondary | ICD-10-CM

## 2022-12-07 DIAGNOSIS — R519 Headache, unspecified: Secondary | ICD-10-CM | POA: Diagnosis present

## 2022-12-07 DIAGNOSIS — E1165 Type 2 diabetes mellitus with hyperglycemia: Secondary | ICD-10-CM | POA: Insufficient documentation

## 2022-12-07 DIAGNOSIS — Z7902 Long term (current) use of antithrombotics/antiplatelets: Secondary | ICD-10-CM | POA: Diagnosis not present

## 2022-12-07 DIAGNOSIS — Z87891 Personal history of nicotine dependence: Secondary | ICD-10-CM | POA: Diagnosis not present

## 2022-12-07 DIAGNOSIS — Z79899 Other long term (current) drug therapy: Secondary | ICD-10-CM | POA: Diagnosis not present

## 2022-12-07 LAB — CBC WITH DIFFERENTIAL/PLATELET
Abs Immature Granulocytes: 0.01 10*3/uL (ref 0.00–0.07)
Basophils Absolute: 0.1 10*3/uL (ref 0.0–0.1)
Basophils Relative: 1 %
Eosinophils Absolute: 0.7 10*3/uL — ABNORMAL HIGH (ref 0.0–0.5)
Eosinophils Relative: 12 %
HCT: 33.2 % — ABNORMAL LOW (ref 36.0–46.0)
Hemoglobin: 11.1 g/dL — ABNORMAL LOW (ref 12.0–15.0)
Immature Granulocytes: 0 %
Lymphocytes Relative: 36 %
Lymphs Abs: 2.3 10*3/uL (ref 0.7–4.0)
MCH: 28.2 pg (ref 26.0–34.0)
MCHC: 33.4 g/dL (ref 30.0–36.0)
MCV: 84.3 fL (ref 80.0–100.0)
Monocytes Absolute: 0.4 10*3/uL (ref 0.1–1.0)
Monocytes Relative: 6 %
Neutro Abs: 2.9 10*3/uL (ref 1.7–7.7)
Neutrophils Relative %: 45 %
Platelets: 365 10*3/uL (ref 150–400)
RBC: 3.94 MIL/uL (ref 3.87–5.11)
RDW: 14.1 % (ref 11.5–15.5)
WBC: 6.3 10*3/uL (ref 4.0–10.5)
nRBC: 0 % (ref 0.0–0.2)

## 2022-12-07 LAB — COMPREHENSIVE METABOLIC PANEL
ALT: 21 U/L (ref 0–44)
AST: 19 U/L (ref 15–41)
Albumin: 4.4 g/dL (ref 3.5–5.0)
Alkaline Phosphatase: 59 U/L (ref 38–126)
Anion gap: 9 (ref 5–15)
BUN: 19 mg/dL (ref 8–23)
CO2: 22 mmol/L (ref 22–32)
Calcium: 9.8 mg/dL (ref 8.9–10.3)
Chloride: 100 mmol/L (ref 98–111)
Creatinine, Ser: 1.46 mg/dL — ABNORMAL HIGH (ref 0.44–1.00)
GFR, Estimated: 34 mL/min — ABNORMAL LOW (ref >60)
Glucose, Bld: 112 mg/dL — ABNORMAL HIGH (ref 70–99)
Potassium: 4.4 mmol/L (ref 3.5–5.1)
Sodium: 131 mmol/L — ABNORMAL LOW (ref 135–145)
Total Bilirubin: 0.5 mg/dL (ref 0.3–1.2)
Total Protein: 7.5 g/dL (ref 6.5–8.1)

## 2022-12-07 MED ORDER — HYDRALAZINE HCL 20 MG/ML IJ SOLN
5.0000 mg | Freq: Once | INTRAMUSCULAR | Status: DC
  Filled 2022-12-07: qty 1

## 2022-12-07 NOTE — ED Triage Notes (Signed)
Pt in with c/o HTN, states she felt a splitting headache this afternoon and took her blood pressure and it was 198/91. Has HTN hx, takes 3 different meds for bp. States she had some indigestion earlier, no other c/o chest pain.

## 2022-12-08 NOTE — ED Provider Notes (Signed)
[place] EMERGENCY DEPARTMENT AT [facility] Provider Note  CSN: [id number] Arrival date & time: 12/07/22 4098  Chief Complaint(s) Hypertension  HPI [name] is a 89 y.o. female with PMH HTN, anxiety, depression, peptic ulcer disease, T2DM who presents emergency department for evaluation of hypertension and headache.  Patient states that she takes 3 blood pressure medications at home and has been compliant with these medications.  She states that she started to develop a gradual onset but worsening headache over the course of the day and found her blood pressure at home to be 198/91.  She took an additional 5 mg amlodipine tablet and felt that her headache started to improve.  By the time that she arrives in the emergency department today, her symptoms have completely resolved.  She currently denies chest pain, shortness of breath, abdominal pain, nausea, vomiting, headache, numbness, tingling, weakness or any other neurologic or systemic complaints.   Past Medical History Past Medical History:  Diagnosis Date   Allergy    Anemia    Anxiety    Arthritis    Blood transfusion    Blood transfusion without reported diagnosis    Bronchiectasis (HCC)    Cataract    surgery 2008   Depression    Diabetes mellitus without complication (HCC)    no meds, diet controlled   Diverticulosis    DIVERTICULOSIS OF COLON 03/10/2008   Qualifier: Diagnosis of  By: Candice Camp CMA (AAMA), Dottie     Esophageal dysmotility    Esophageal reflux    Esophageal stricture    H. pylori infection    Hiatal hernia    PUD (peptic ulcer disease)    Renal cyst    RENAL CYST 03/01/2010   Stable over course of one year. No further followup     Stress incontinence    Unspecified essential hypertension    Patient Active Problem List   Diagnosis Date Noted   Chronic bronchitis (HCC) 03/05/2016   Bronchiectasis without acute exacerbation (HCC) 03/05/2016   Allergic rhinitis 03/05/2016    Depression 02/23/2014   TIA ? 03/07/2013   OSTEOARTHRITIS 02/21/2010   Polyuria 07/21/2009   IRON DEFICIENCY ANEMIA SECONDARY TO BLOOD LOSS 09/16/2008   INSOMNIA 06/29/2008   HIATAL HERNIA 03/10/2008   LOW BACK PAIN 02/03/2008   ESOPHAGEAL MOTILITY DISORDER 01/07/2008   CONSTIPATION, CHRONIC 09/10/2007   HYPERGLYCEMIA, BORDERLINE 05/05/2007   COUGH, CHRONIC 03/12/2007   HYPERTENSION 12/17/2006   ESOPHAGEAL STRICTURE 12/17/2006   GERD 12/17/2006   Home Medication(s) Prior to Admission medications   Medication Sig Start Date End Date Taking? Authorizing Provider  albuterol (PROVENTIL) (2.5 MG/3ML) 0.083% nebulizer solution Take 2.5 mg by nebulization every 6 (six) hours as needed for wheezing or shortness of breath.    [provider]  amitriptyline (ELAVIL) 25 MG tablet Take 25 mg by mouth at bedtime. 04/29/19   [provider]  aspirin EC 81 MG tablet Take 81 mg by mouth daily.    [provider]  AZOR 5-40 MG tablet TAKE 1 TABLET BY MOUTH DAILY. Patient taking differently: Take 1 tablet by mouth daily.  04/17/15   Shelva Majestic, MD  clopidogrel (PLAVIX) 75 MG tablet TAKE 1 TABLET BY MOUTH EVERY DAY 01/25/22   Chuck Hint, MD  co-enzyme Q-10 30 MG capsule Take 30 mg by mouth daily.    [provider]  cyanocobalamin 1000 MCG tablet Take 1,000 mcg by mouth daily.     [provider]  diclofenac  Sodium (VOLTAREN) 1 % GEL Apply 2 g topically 4 (four) times daily.    [provider]  DULoxetine (CYMBALTA) 60 MG capsule Take 1 capsule (60 mg total) by mouth daily. 04/20/14   Shelva Majestic, MD  esomeprazole (NEXIUM) 40 MG capsule Take 1 capsule by mouth  every morning Patient taking differently: Take 40 mg by mouth daily.  03/11/16   Napoleon Form, MD  ferrous sulfate 325 (65 FE) MG tablet Take 325 mg by mouth daily with breakfast.     [provider]  fluticasone furoate-vilanterol (BREO ELLIPTA) 100-25  MCG/INH AEPB Inhale 1 puff into the lungs daily.    [provider]  gabapentin (NEURONTIN) 300 MG capsule Take 1 capsule (300 mg total) by mouth 2 (two) times daily. 04/06/14   Shelva Majestic, MD  gabapentin (NEURONTIN) 400 MG capsule Take 400 mg by mouth 2 (two) times daily.    [provider]  HYDROcodone-acetaminophen (NORCO/VICODIN) 5-325 MG per tablet Take 1 tablet by mouth 2 (two) times daily.     [provider]  loratadine (CLARITIN) 10 MG tablet Take 10 mg by mouth daily.    [provider]  meclizine (ANTIVERT) 12.5 MG tablet Take 12.5 mg by mouth 3 (three) times daily as needed for dizziness.    [provider]  Multiple Vitamin (MULTIVITAMIN WITH MINERALS) TABS Take 1 tablet by mouth every morning.     [provider]  prednisoLONE acetate (PRED FORTE) 1 % ophthalmic suspension Place 1 drop into both eyes 2 (two) times daily as needed. For eye irritation 04/20/14   Shelva Majestic, MD  PROAIR HFA 108 [phone] Base) MCG/ACT inhaler Inhale 2 puffs into the lungs every 6 (six) hours as needed for wheezing or shortness of breath.  02/12/16   [provider]  senna (SENOKOT) 8.6 MG tablet Take 2 tablets by mouth daily.     [provider]  simvastatin (ZOCOR) 40 MG tablet Take 40 mg by mouth daily.    [provider]  solifenacin (VESICARE) 10 MG tablet Take 10 mg by mouth daily.    [provider]  sucralfate (CARAFATE) 1 G tablet TAKE 1 TABLET (1 GRAM) 4 TIMES A DAY (BETWEEN MEALS AND AT BEDTIME) Patient taking differently: Take 1 g by mouth 4 (four) times daily -  with meals and at bedtime.  08/15/14   Shelva Majestic, MD  triamcinolone (NASACORT ALLERGY 24HR) 55 MCG/ACT AERO nasal inhaler Place 2 sprays into the nose daily.    [provider]  XIIDRA 5 % SOLN Apply 1 drop to eye 2 (two) times daily. 05/04/19   [provider]                                                                                                                                     Past Surgical History Past Surgical History:  Procedure Laterality  Date   ABDOMINAL HYSTERECTOMY     APPENDECTOMY     CAROTID ANGIOGRAPHY Right 05/17/2019   Procedure: CAROTID ANGIOGRAPHY;  Surgeon: Maeola Harman, MD;  Location: [facility] INVASIVE CV LAB;  Service: Cardiovascular;  Laterality: Right;   CATARACT EXTRACTION, BILATERAL     COLONOSCOPY     KNEE ARTHROPLASTY  11/01/2011   Procedure: COMPUTER ASSISTED TOTAL KNEE ARTHROPLASTY;  Surgeon: Harvie Junior, MD;  Location: [facility] OR;  Service: Orthopedics;  Laterality: Left;  GENERAL ANESTHESIA WITH PRE OP FEMORAL NERVE BLOCK   OOPHORECTOMY     right knee replacement     ROTATOR CUFF REPAIR     right side, left-unknown repair   UPPER GASTROINTESTINAL ENDOSCOPY     Family History Family History  Problem Relation Age of Onset   Diabetes Mother        aunts   Heart disease Maternal Aunt    Diabetes Maternal Aunt    Kidney disease Maternal Aunt    Stroke Maternal Grandmother    Colon cancer Neg Hx    Esophageal cancer Neg Hx    Rectal cancer Neg Hx    Stomach cancer Neg Hx    Lung disease Neg Hx    Rheumatologic disease Neg Hx     Social History Social History   Tobacco Use   Smoking status: Former    Packs/day: 0.10    Years: 40.00    Additional pack years: 0.00    Total pack years: 4.00    Types: Cigarettes    Quit date: 06/24/1990    Years since quitting: 32.4   Smokeless tobacco: Never  Vaping Use   Vaping Use: Never used  Substance Use Topics   Alcohol use: No   Drug use: No   Allergies Nsaids  Review of Systems Review of Systems  Neurological:  Positive for headaches.    Physical Exam Vital Signs  I have reviewed the triage vital signs BP 137/76 (BP Location: Right Arm)   Pulse 75   Temp 98.8 F (37.1 C) (Oral)   Resp 19   Wt 75.8 kg   SpO2 95%   BMI 27.79 kg/m   Physical Exam Vitals and nursing note reviewed.   Constitutional:      General: She is not in acute distress.    Appearance: She is well-developed.  HENT:     Head: Normocephalic and atraumatic.  Eyes:     Conjunctiva/sclera: Conjunctivae normal.  Cardiovascular:     Rate and Rhythm: Normal rate and regular rhythm.     Heart sounds: No murmur heard. Pulmonary:     Effort: Pulmonary effort is normal. No respiratory distress.  Musculoskeletal:        General: No swelling.     Cervical back: Neck supple.  Skin:    General: Skin is warm and dry.     Capillary Refill: Capillary refill takes less than 2 seconds.  Neurological:     Mental Status: She is alert.     Cranial Nerves: No cranial nerve deficit.     Sensory: No sensory deficit.     Motor: No weakness.  Psychiatric:        Mood and Affect: Mood normal.     ED Results and Treatments Labs (all labs ordered are listed, but only abnormal results are displayed) Labs Reviewed  COMPREHENSIVE METABOLIC PANEL - Abnormal; Notable for the following components:      Result Value   Sodium 131 (*)    Glucose, Bld  112 (*)    Creatinine, Ser 1.46 (*)    GFR, Estimated 34 (*)    All other components within normal limits  CBC WITH DIFFERENTIAL/PLATELET - Abnormal; Notable for the following components:   Hemoglobin 11.1 (*)    HCT 33.2 (*)    Eosinophils Absolute 0.7 (*)    All other components within normal limits                                                                                                                          Radiology No results found.  Pertinent labs & imaging results that were available during my care of the patient were reviewed by me and considered in my medical decision making (see MDM for details).  Medications Ordered in ED Medications - No data to display                                                                                                                                   Procedures Procedures  (including critical care  time)  Medical Decision Making / ED Course   This patient presents to the ED for concern of hypertension, headache, this involves an extensive number of treatment options, and is a complaint that carries with it a high risk of complications and morbidity.  The differential diagnosis includes primary hypertension, secondary hypertension, failure of outpatient medication regimen, whitecoat hypertension, anxiety, intracranial hemorrhage  MDM: Patient seen emergency room for evaluation of hypertension and headache.  Physical exam is unremarkable with no focal motor or sensory deficits.  Laboratory evaluation is reassuring with no evidence of endorgan damage and creatinine appears to be improved from previous.  Blood pressure readings when a physician is not present in the room but appear to be normal at 137/76.  I do see that when I entered the room, her blood pressure does increase and there may be a small element of anxiety/whitecoat hypertension associated with this.  However, patient currently is not in hypertensive emergency and does not meet inpatient criteria for admission.  She was given a blood pressure log to use at home and will reach out to her primary care physician to ensure her outpatient regimen is appropriate.  At this time she does not meet inpatient criteria for admission and is safe for discharge with outpatient follow-up.   Additional history obtained: -Additional history obtained from family member -External records from outside  source obtained and reviewed including: Chart review including previous notes, labs, imaging, consultation notes   Lab Tests: -I ordered, reviewed, and interpreted labs.   The pertinent results include:   Labs Reviewed  COMPREHENSIVE METABOLIC PANEL - Abnormal; Notable for the following components:      Result Value   Sodium 131 (*)    Glucose, Bld 112 (*)    Creatinine, Ser 1.46 (*)    GFR, Estimated 34 (*)    All other components within normal  limits  CBC WITH DIFFERENTIAL/PLATELET - Abnormal; Notable for the following components:   Hemoglobin 11.1 (*)    HCT 33.2 (*)    Eosinophils Absolute 0.7 (*)    All other components within normal limits       Medicines ordered and prescription drug management: Meds ordered this encounter  Medications   DISCONTD: hydrALAZINE (APRESOLINE) injection 5 mg    -I have reviewed the patients home medicines and have made adjustments as needed  Critical interventions none   Cardiac Monitoring: The patient was maintained on a cardiac monitor.  I personally viewed and interpreted the cardiac monitored which showed an underlying rhythm of: NSR  Social Determinants of Health:  Factors impacting patients care include: none   Reevaluation: After the interventions noted above, I reevaluated the patient and found that they have :improved  Co morbidities that complicate the patient evaluation  Past Medical History:  Diagnosis Date   Allergy    Anemia    Anxiety    Arthritis    Blood transfusion    Blood transfusion without reported diagnosis    Bronchiectasis (HCC)    Cataract    surgery 2008   Depression    Diabetes mellitus without complication (HCC)    no meds, diet controlled   Diverticulosis    DIVERTICULOSIS OF COLON 03/10/2008   Qualifier: Diagnosis of  By: Candice Camp CMA (AAMA), Dottie     Esophageal dysmotility    Esophageal reflux    Esophageal stricture    H. pylori infection    Hiatal hernia    PUD (peptic ulcer disease)    Renal cyst    RENAL CYST 03/01/2010   Stable over course of one year. No further followup     Stress incontinence    Unspecified essential hypertension       Dispostion: I considered admission for this patient, but at this time she does not meet inpatient criteria for admission and she is safe for discharge with outpatient follow-up     Final Clinical Impression(s) / ED Diagnoses Final diagnoses:  Secondary hypertension      @PCDICTATION @    Glendora Score, MD 12/08/22 1218

## 2023-01-04 ENCOUNTER — Encounter (HOSPITAL_COMMUNITY): Payer: Self-pay

## 2023-01-04 ENCOUNTER — Other Ambulatory Visit: Payer: Self-pay

## 2023-01-04 ENCOUNTER — Emergency Department (HOSPITAL_COMMUNITY)
Admission: EM | Admit: 2023-01-04 | Discharge: 2023-01-04 | Disposition: A | Discharge status: Home / Self Care | Payer: 59 | Attending: Student | Admitting: Student

## 2023-01-04 DIAGNOSIS — I10 Essential (primary) hypertension: Secondary | ICD-10-CM | POA: Diagnosis not present

## 2023-01-04 DIAGNOSIS — E119 Type 2 diabetes mellitus without complications: Secondary | ICD-10-CM | POA: Diagnosis not present

## 2023-01-04 DIAGNOSIS — Z7902 Long term (current) use of antithrombotics/antiplatelets: Secondary | ICD-10-CM | POA: Diagnosis not present

## 2023-01-04 DIAGNOSIS — H7292 Unspecified perforation of tympanic membrane, left ear: Secondary | ICD-10-CM | POA: Diagnosis not present

## 2023-01-04 DIAGNOSIS — Z87891 Personal history of nicotine dependence: Secondary | ICD-10-CM | POA: Insufficient documentation

## 2023-01-04 DIAGNOSIS — Z7982 Long term (current) use of aspirin: Secondary | ICD-10-CM | POA: Diagnosis not present

## 2023-01-04 DIAGNOSIS — Z96652 Presence of left artificial knee joint: Secondary | ICD-10-CM | POA: Diagnosis not present

## 2023-01-04 DIAGNOSIS — Z79899 Other long term (current) drug therapy: Secondary | ICD-10-CM | POA: Insufficient documentation

## 2023-01-04 DIAGNOSIS — H9202 Otalgia, left ear: Secondary | ICD-10-CM | POA: Diagnosis present

## 2023-01-04 MED ORDER — OFLOXACIN 0.3 % OP SOLN
5.0000 [drp] | Freq: Every day | OPHTHALMIC | Status: DC
  Administered 2023-01-04: 5 [drp] via OTIC
  Filled 2023-01-04: qty 5

## 2023-01-04 NOTE — ED Triage Notes (Signed)
Pt was cleaning her ear this morning with a Q-tip and went to far and hit something she states and then her ear started pouring blood. Pt is on a blood thinner. Pt states it is not coming out of her ear anymore but now she feels like she can't hear out of that left ear.

## 2023-01-04 NOTE — Discharge Instructions (Addendum)
5 drops x1 daily

## 2023-01-04 NOTE — ED Provider Notes (Signed)
[facility]ERG[facility] DEPARTMENT AT [facility] Provider Note  CSN: [id number] Arrival date & time: 01/04/23 1230  Chief Complaint(s) Ear Drainage  HPI KEYOSHIA EATMON is a 89 y.o. female who presents emergency department for evaluation of left ear pain, bleeding and decreased hearing.  Patient states that this morning she was using a Q-tip to clean her ear when she felt a sudden sharp pain, blood from the ear and muffled sounds on that side.  Patient is on Plavix but did not take her medication this morning.  Denies chest pain, shortness of breath, abdominal pain, nausea or vomiting or other systemic symptoms.   Past Medical History Past Medical History:  Diagnosis Date   Allergy    Anemia    Anxiety    Arthritis    Blood transfusion    Blood transfusion without reported diagnosis    Bronchiectasis (HCC)    Cataract    surgery 2008   Depression    Diabetes mellitus without complication (HCC)    no meds, diet controlled   Diverticulosis    DIVERTICULOSIS OF COLON 03/10/2008   Qualifier: Diagnosis of  By: Candice Camp CMA (AAMA), Dottie     Esophageal dysmotility    Esophageal reflux    Esophageal stricture    H. pylori infection    Hiatal hernia    PUD (peptic ulcer disease)    Renal cyst    RENAL CYST 03/01/2010   Stable over course of one year. No further followup     Stress incontinence    Unspecified essential hypertension    Patient Active Problem List   Diagnosis Date Noted   Chronic bronchitis (HCC) 03/05/2016   Bronchiectasis without acute exacerbation (HCC) 03/05/2016   Allergic rhinitis 03/05/2016   Depression 02/23/2014   TIA ? 03/07/2013   OSTEOARTHRITIS 02/21/2010   Polyuria 07/21/2009   IRON DEFICIENCY ANEMIA SECONDARY TO BLOOD LOSS 09/16/2008   INSOMNIA 06/29/2008   HIATAL HERNIA 03/10/2008   LOW BACK PAIN 02/03/2008   ESOPHAGEAL MOTILITY DISORDER 01/07/2008   CONSTIPATION, CHRONIC 09/10/2007   HYPERGLYCEMIA, BORDERLINE 05/05/2007   COUGH,  CHRONIC 03/12/2007   HYPERTENSION 12/17/2006   ESOPHAGEAL STRICTURE 12/17/2006   GERD 12/17/2006   Home Medication(s) Prior to Admission medications   Medication Sig Start Date End Date Taking? Authorizing Provider  albuterol (PROVENTIL) (2.5 MG/3ML) 0.083% nebulizer solution Take 2.5 mg by nebulization every 6 (six) hours as needed for wheezing or shortness of breath.    [provider]  amitriptyline (ELAVIL) 25 MG tablet Take 25 mg by mouth at bedtime. 04/29/19   [provider]  aspirin EC 81 MG tablet Take 81 mg by mouth daily.    [provider]  AZOR 5-40 MG tablet TAKE 1 TABLET BY MOUTH DAILY. Patient taking differently: Take 1 tablet by mouth daily.  04/17/15   Shelva Majestic, MD  clopidogrel (PLAVIX) 75 MG tablet TAKE 1 TABLET BY MOUTH EVERY DAY 01/25/22   Chuck Hint, MD  co-enzyme Q-10 30 MG capsule Take 30 mg by mouth daily.    [provider]  cyanocobalamin 1000 MCG tablet Take 1,000 mcg by mouth daily.     [provider]  diclofenac Sodium (VOLTAREN) 1 % GEL Apply 2 g topically 4 (four) times daily.    [provider]  DULoxetine (CYMBALTA) 60 MG capsule Take 1 capsule (60 mg total) by mouth daily. 04/20/14   Shelva Majestic, MD  esomeprazole (NEXIUM) 40 MG capsule Take 1 capsule  by mouth  every morning Patient taking differently: Take 40 mg by mouth daily.  03/11/16   Napoleon Form, MD  ferrous sulfate 325 (65 FE) MG tablet Take 325 mg by mouth daily with breakfast.     [provider]  fluticasone furoate-vilanterol (BREO ELLIPTA) 100-25 MCG/INH AEPB Inhale 1 puff into the lungs daily.    [provider]  gabapentin (NEURONTIN) 300 MG capsule Take 1 capsule (300 mg total) by mouth 2 (two) times daily. 04/06/14   Shelva Majestic, MD  gabapentin (NEURONTIN) 400 MG capsule Take 400 mg by mouth 2 (two) times daily.    [provider]  HYDROcodone-acetaminophen (NORCO/VICODIN)  5-325 MG per tablet Take 1 tablet by mouth 2 (two) times daily.     [provider]  loratadine (CLARITIN) 10 MG tablet Take 10 mg by mouth daily.    [provider]  meclizine (ANTIVERT) 12.5 MG tablet Take 12.5 mg by mouth 3 (three) times daily as needed for dizziness.    [provider]  Multiple Vitamin (MULTIVITAMIN WITH MINERALS) TABS Take 1 tablet by mouth every morning.     [provider]  prednisoLONE acetate (PRED FORTE) 1 % ophthalmic suspension Place 1 drop into both eyes 2 (two) times daily as needed. For eye irritation 04/20/14   Shelva Majestic, MD  PROAIR HFA 108 [phone] Base) MCG/ACT inhaler Inhale 2 puffs into the lungs every 6 (six) hours as needed for wheezing or shortness of breath.  02/12/16   [provider]  senna (SENOKOT) 8.6 MG tablet Take 2 tablets by mouth daily.     [provider]  simvastatin (ZOCOR) 40 MG tablet Take 40 mg by mouth daily.    [provider]  solifenacin (VESICARE) 10 MG tablet Take 10 mg by mouth daily.    [provider]  sucralfate (CARAFATE) 1 G tablet TAKE 1 TABLET (1 GRAM) 4 TIMES A DAY (BETWEEN MEALS AND AT BEDTIME) Patient taking differently: Take 1 g by mouth 4 (four) times daily -  with meals and at bedtime.  08/15/14   Shelva Majestic, MD  triamcinolone (NASACORT ALLERGY 24HR) 55 MCG/ACT AERO nasal inhaler Place 2 sprays into the nose daily.    [provider]  XIIDRA 5 % SOLN Apply 1 drop to eye 2 (two) times daily. 05/04/19   [provider]                                                                                                                                    Past Surgical History Past Surgical History:  Procedure Laterality Date   ABDOMINAL HYSTERECTOMY     APPENDECTOMY     CAROTID ANGIOGRAPHY Right 05/17/2019   Procedure: CAROTID ANGIOGRAPHY;  Surgeon: Maeola Harman, MD;  Location: [facility] INVASIVE CV LAB;  Service:  Cardiovascular;  Laterality: Right;   CATARACT EXTRACTION, BILATERAL  COLONOSCOPY     KNEE ARTHROPLASTY  11/01/2011   Procedure: COMPUTER ASSISTED TOTAL KNEE ARTHROPLASTY;  Surgeon: Harvie Junior, MD;  Location: [facility] OR;  Service: Orthopedics;  Laterality: Left;  GENERAL ANESTHESIA WITH PRE OP FEMORAL NERVE BLOCK   OOPHORECTOMY     right knee replacement     ROTATOR CUFF REPAIR     right side, left-unknown repair   UPPER GASTROINTESTINAL ENDOSCOPY     Family History Family History  Problem Relation Age of Onset   Diabetes Mother        aunts   Heart disease Maternal Aunt    Diabetes Maternal Aunt    Kidney disease Maternal Aunt    Stroke Maternal Grandmother    Colon cancer Neg Hx    Esophageal cancer Neg Hx    Rectal cancer Neg Hx    Stomach cancer Neg Hx    Lung disease Neg Hx    Rheumatologic disease Neg Hx     Social History Social History   Tobacco Use   Smoking status: Former    Current packs/day: 0.00    Average packs/day: 0.1 packs/day for 40.0 years (4.0 ttl pk-yrs)    Types: Cigarettes    Start date: 06/24/1950    Quit date: 06/24/1990    Years since quitting: 32.5   Smokeless tobacco: Never  Vaping Use   Vaping status: Never Used  Substance Use Topics   Alcohol use: No   Drug use: No   Allergies Nsaids  Review of Systems Review of Systems  HENT:  Positive for ear discharge, ear pain and hearing loss.     Physical Exam Vital Signs  I have reviewed the triage vital signs BP 126/86 (BP Location: Right Arm)   Pulse (!) 101   Temp 98 F (36.7 C) (Oral)   Resp 18   Ht 5\' 5"  (1.651 m)   Wt 74.8 kg   SpO2 98%   BMI 27.46 kg/m   Physical Exam Vitals and nursing note reviewed.  Constitutional:      General: She is not in acute distress.    Appearance: She is well-developed.  HENT:     Head: Normocephalic.     Ears:     Comments: Left ear canal filled with blood, clotted Eyes:     Conjunctiva/sclera: Conjunctivae normal.  Cardiovascular:      Rate and Rhythm: Normal rate and regular rhythm.     Heart sounds: No murmur heard. Pulmonary:     Effort: Pulmonary effort is normal. No respiratory distress.     Breath sounds: Normal breath sounds.  Abdominal:     Palpations: Abdomen is soft.     Tenderness: There is no abdominal tenderness.  Musculoskeletal:        General: No swelling.     Cervical back: Neck supple.  Skin:    General: Skin is warm and dry.     Capillary Refill: Capillary refill takes less than 2 seconds.  Neurological:     Mental Status: She is alert.  Psychiatric:        Mood and Affect: Mood normal.     ED Results and Treatments Labs (all labs ordered are listed, but only abnormal results are displayed) Labs Reviewed - No data to display  Radiology No results found.  Pertinent labs & imaging results that were available during my care of the patient were reviewed by me and considered in my medical decision making (see MDM for details).  Medications Ordered in ED Medications  ofloxacin (OCUFLOX) 0.3 % ophthalmic solution 5 drop (5 drops Left EAR Given 01/04/23 1405)                                                                                                                                     Procedures Procedures  (including critical care time)  Medical Decision Making / ED Course   This patient presents to the ED for concern of ear discharge, hearing loss, this involves an extensive number of treatment options, and is a complaint that carries with it a high risk of complications and morbidity.  The differential diagnosis includes tympanic membrane rupture, canal laceration, otitis media, otitis externa  MDM: Patient seen emergency room for evaluation of ear discharge and hearing loss.  Physical exam reveals bleeding in the external auditory canal obstructing the view of the  tympanic membrane on the left.  Suspect either canal wall laceration with appropriate clotting versus underlying tympanic membrane rupture.  Will cover with ofloxacin and patient will need to follow-up outpatient with ENT.  This time she does not meet inpatient criteria for admission and is safe for discharge with outpatient ENT follow-up.   Additional history obtained: -Additional history obtained from daughter -External records from outside source obtained and reviewed including: Chart review including previous notes, labs, imaging, consultation notes     Medicines ordered and prescription drug management: Meds ordered this encounter  Medications   ofloxacin (OCUFLOX) 0.3 % ophthalmic solution 5 drop    -I have reviewed the patients home medicines and have made adjustments as needed  Critical interventions none   Cardiac Monitoring: The patient was maintained on a cardiac monitor.  I personally viewed and interpreted the cardiac monitored which showed an underlying rhythm of: NSR  Social Determinants of Health:  Factors impacting patients care include: none   Reevaluation: After the interventions noted above, I reevaluated the patient and found that they have :stayed the same  Co morbidities that complicate the patient evaluation  Past Medical History:  Diagnosis Date   Allergy    Anemia    Anxiety    Arthritis    Blood transfusion    Blood transfusion without reported diagnosis    Bronchiectasis (HCC)    Cataract    surgery 2008   Depression    Diabetes mellitus without complication (HCC)    no meds, diet controlled   Diverticulosis    DIVERTICULOSIS OF COLON 03/10/2008   Qualifier: Diagnosis of  By: Nelson-Smith CMA (AAMA), Dottie     Esophageal dysmotility    Esophageal reflux    Esophageal stricture    H. pylori infection    Hiatal hernia    PUD (peptic ulcer  disease)    Renal cyst    RENAL CYST 03/01/2010   Stable over course of one year. No further  followup     Stress incontinence    Unspecified essential hypertension       Dispostion: I considered admission for this patient, but at this time she does not meet inpatient criteria for admission and she is safe for discharge with outpatient follow-up     Final Clinical Impression(s) / ED Diagnoses Final diagnoses:  Perforation of left tympanic membrane     @PCDICTATION @    Glendora Score, MD 01/04/23 1844

## 2023-02-08 ENCOUNTER — Other Ambulatory Visit: Payer: Self-pay | Admitting: Vascular Surgery

## 2024-04-22 ENCOUNTER — Other Ambulatory Visit: Payer: Self-pay

## 2024-04-22 MED ORDER — CLOPIDOGREL BISULFATE 75 MG PO TABS: 75.0000 mg | ORAL_TABLET | Freq: Every day | ORAL | 3 refills | Status: AC

## 2024-06-01 ENCOUNTER — Encounter: Payer: Self-pay | Admitting: Gastroenterology

## 2024-07-09 ENCOUNTER — Ambulatory Visit: Admitting: Gastroenterology

## 2024-07-09 ENCOUNTER — Encounter: Payer: Self-pay | Admitting: Gastroenterology

## 2024-07-09 VITALS — BP 144/80 | HR 92 | Ht 63.5 in | Wt 182.5 lb

## 2024-07-09 DIAGNOSIS — K219 Gastro-esophageal reflux disease without esophagitis: Secondary | ICD-10-CM | POA: Diagnosis not present

## 2024-07-09 DIAGNOSIS — Z8719 Personal history of other diseases of the digestive system: Secondary | ICD-10-CM

## 2024-07-09 DIAGNOSIS — Z7901 Long term (current) use of anticoagulants: Secondary | ICD-10-CM

## 2024-07-09 DIAGNOSIS — R1310 Dysphagia, unspecified: Secondary | ICD-10-CM

## 2024-07-09 DIAGNOSIS — R1319 Other dysphagia: Secondary | ICD-10-CM

## 2024-07-09 MED ORDER — PANTOPRAZOLE SODIUM 40 MG PO TBEC: 40.0000 mg | DELAYED_RELEASE_TABLET | Freq: Two times a day (BID) | ORAL | 1 refills | Status: AC

## 2024-07-09 NOTE — Patient Instructions (Signed)
 You have been scheduled for a Barium Esophogram at [facility] Radiology (1st floor of the hospital) on Wednesday August 04, 2024 at 9:00 am. Please arrive 30 minutes prior to your appointment for registration. Make certain not to have anything to eat or drink 3 hours prior to your test. If you need to reschedule for any reason, please contact radiology at [phone] to do so. __________________________________________________________________ A barium swallow is an examination that concentrates on views of the esophagus. This tends to be a double contrast exam (barium and two liquids which, when combined, create a gas to distend the wall of the oesophagus) or single contrast (non-ionic iodine  based). The study is usually tailored to your symptoms so a good history is essential. Attention is paid during the study to the form, structure and configuration of the esophagus, looking for functional disorders (such as aspiration, dysphagia, achalasia, motility and reflux) EXAMINATION You may be asked to change into a gown, depending on the type of swallow being performed. A radiologist and radiographer will perform the procedure. The radiologist will advise you of the type of contrast selected for your procedure and direct you during the exam. You will be asked to stand, sit or lie in several different positions and to hold a small amount of fluid in your mouth before being asked to swallow while the imaging is performed .In some instances you may be asked to swallow barium coated marshmallows to assess the motility of a solid food bolus. The exam can be recorded as a digital or video fluoroscopy procedure. POST PROCEDURE It will take 1-2 days for the barium to pass through your system. To facilitate this, it is important, unless otherwise directed, to increase your fluids for the next 24-48hrs and to resume your normal diet.  This test typically takes about 30 minutes to  perform. __________________________________________________________________________________  _______________________________________________________  If your blood pressure at your visit was 140/90 or greater, please contact your primary care physician to follow up on this.  _______________________________________________________  If you are age 107 or older, your body mass index should be between 23-30. Your Body mass index is 31.82 kg/m. If this is out of the aforementioned range listed, please consider follow up with your Primary Care Provider.  If you are age 14 or younger, your body mass index should be between 19-25. Your Body mass index is 31.82 kg/m. If this is out of the aformentioned range listed, please consider follow up with your Primary Care Provider.   ________________________________________________________  The [facility] providers would like to encourage you to use MYCHART to communicate with providers for non-urgent requests or questions.  Due to long hold times on the telephone, sending your provider a message by [facility] may be a faster and more efficient way to get a response.  Please allow 48 business hours for a response.  Please remember that this is for non-urgent requests.  _______________________________________________________  [facility] is using a team-based approach to care.  Your team is made up of your doctor and two to three APPS. Our APPS (Nurse Practitioners and Physician Assistants) work with your physician to ensure care continuity for you. They are fully qualified to address your health concerns and develop a treatment plan. They communicate directly with your gastroenterologist to care for you. Seeing the Advanced Practice Practitioners on your physician's team can help you by facilitating care more promptly, often allowing for earlier appointments, access to diagnostic testing, procedures, and other specialty referrals.   Due to recent  changes in healthcare laws, you may see the results of your imaging and laboratory studies on MyChart before your provider has had a chance to review them.  We understand that in some cases there may be results that are confusing or concerning to you. Not all laboratory results come back in the same time frame and the provider may be waiting for multiple results in order to interpret others.  Please give us  48 hours in order for your provider to thoroughly review all the results before contacting the office for clarification of your results.

## 2024-07-09 NOTE — Progress Notes (Signed)
 "  [name] [id number] 06-12-1931   Chief Complaint: Heartburn, trouble swallowing  Referring Provider: Shelda Atlas, MD Primary GI MD: Dr. Shila  HPI: [name] is a 89 y.o. female with past medical history of OA, CKD, anxiety, BPPV, T2DM, GERD, HTN, carotid stenosis, chronic anticoagulation on Plavix  who presents today for a complaint of heartburn and trouble swallowing.    Patient is referred by PCP for evaluation of GERD with heartburn.  Seen by PCP 05/24/2024 for follow-up of GERD.  She had stopped taking Nexium  on advice of her pharmacist due to potential interaction with Plavix .  PCP started her on pantoprazole  40 mg daily, famotidine discontinued, lifestyle modifications recommended, referred to GI.  Patient had an upper endoscopy in 2015 with Dr. Obie which revealed mild benign-appearing distal esophageal stricture which was dilated.  Last seen in office 07/20/2015 by Dr. Nandigam for evaluation of intermittent GERD symptoms.  Was doing well at that time overall and was advised to continue Nexium  and antireflux measures, follow-up as needed, EGD deferred.   Discussed the use of AI scribe software for clinical note transcription with the patient, who gave verbal consent to proceed.  History of Present Illness [name] is a 89 year old female with chronic gastroesophageal reflux disease who presents for evaluation of worsening reflux symptoms and dysphagia.  She is accompanied by her daughter.  Gastroesophageal Reflux Symptoms: - Worsening reflux symptoms began after discontinuation of esomeprazole  (Nexium ) due to potential drug interaction with clopidogrel  (Plavix ) - Persistent and severe symptoms during period off all reflux medications, especially nocturnally - Nightly episodes of heartburn and indigestion disrupt sleep - Uses extra pillows at night, but not a wedge pillow - Initiation of pantoprazole  (Protonix ) by primary care provider did not improve  symptoms; severity remains unchanged, particularly at night - Symptoms currently described as 'just about the same' as when off all reflux medications  Dysphagia: - Onset of dysphagia with recurrence after stopping esomeprazole  (Nexium ) - Episodes of food impaction and inability to swallow, sometimes with associated swelling and inability to belch - Dysphagia now less frequent but persists, with sensation of food getting stuck in the lower chest - No significant weight loss - History of esophageal dilations and prior diagnosis of esophageal narrowing - Previous swallowing study and diagnostic imaging led to diagnosis of reflux  Chronic Constipation and Gastrointestinal Symptoms: - Longstanding constipation, unchanged from baseline - Iron supplementation causes dark stools - No vomiting; occasional nausea - Uses sucralfate  as needed for upper abdominal discomfort, recently restarted due to increased stomach symptoms - Bowel movements otherwise unchanged   Previous GI Procedures/Imaging   EGD 06/21/2014 1. mild benign- appearing distal esophageal stricture  2. Dilation with Savary dilators from 14- 16 mm   Past Medical History:  Diagnosis Date   Allergy     Anemia    Anxiety    Arthritis    Blood transfusion    Blood transfusion without reported diagnosis    Bronchiectasis (HCC)    Cataract    surgery 2008   Depression    Diabetes mellitus without complication (HCC)    no meds, diet controlled   Diverticulosis    DIVERTICULOSIS OF COLON 03/10/2008   Qualifier: Diagnosis of  By: Nelson-Smith CMA (AAMA), Dottie     Esophageal dysmotility    Esophageal reflux    Esophageal stricture    H. pylori infection    Hiatal hernia    PUD (peptic ulcer disease)    Renal cyst  RENAL CYST 03/01/2010   Stable over course of one year. No further followup     Stress incontinence    Unspecified essential hypertension     Past Surgical History:  Procedure Laterality Date   ABDOMINAL  HYSTERECTOMY     APPENDECTOMY     CAROTID ANGIOGRAPHY Right 05/17/2019   Procedure: CAROTID ANGIOGRAPHY;  Surgeon: Sheree Penne Bruckner, MD;  Location: [facility] CV [facility];  Service: Cardiovascular;  Laterality: Right;   CATARACT EXTRACTION, BILATERAL     COLONOSCOPY     KNEE ARTHROPLASTY  11/01/2011   Procedure: COMPUTER ASSISTED TOTAL KNEE ARTHROPLASTY;  Surgeon: Norleen LITTIE Gavel, MD;  Location: [facility] OR;  Service: Orthopedics;  Laterality: Left;  GENERAL ANESTHESIA WITH PRE OP FEMORAL NERVE BLOCK   OOPHORECTOMY     right knee replacement     ROTATOR CUFF REPAIR     right side, left-unknown repair   UPPER GASTROINTESTINAL ENDOSCOPY      Current Outpatient Medications  Medication Sig Dispense Refill   albuterol  (PROVENTIL ) (2.5 MG/3ML) 0.083% nebulizer solution Take 2.5 mg by nebulization every 6 (six) hours as needed for wheezing or shortness of breath.     amitriptyline (ELAVIL) 25 MG tablet Take 25 mg by mouth at bedtime.     amLODipine  (NORVASC ) 10 MG tablet Take 10 mg by mouth daily.     aspirin  EC 81 MG tablet Take 81 mg by mouth daily.     Azelastine HCl 137 MCG/SPRAY SOLN Place 2 sprays into both nostrils 2 (two) times daily. (Patient taking differently: Place 2 sprays into both nostrils as needed.)     AZOR  5-40 MG tablet TAKE 1 TABLET BY MOUTH DAILY. (Patient taking differently: Take 1 tablet by mouth daily. ) 30 tablet 4   clopidogrel  (PLAVIX ) 75 MG tablet Take 1 tablet (75 mg total) by mouth daily. 90 tablet 3   cyanocobalamin  1000 MCG tablet Take 1,000 mcg by mouth daily.      diclofenac Sodium (VOLTAREN) 1 % GEL Apply 2 g topically 4 (four) times daily.     DULoxetine  (CYMBALTA ) 60 MG capsule Take 1 capsule (60 mg total) by mouth daily. 90 capsule 0   ferrous sulfate  325 (65 FE) MG tablet Take 2 tablets by mouth daily with breakfast.     fluticasone (FLONASE) 50 MCG/ACT nasal spray Place 2 sprays into both nostrils daily. (Patient taking differently: Place 2 sprays into both  nostrils as needed.)     gabapentin  (NEURONTIN ) 400 MG capsule Take 400 mg by mouth 2 (two) times daily.     HYDROcodone -acetaminophen  (NORCO/VICODIN) 5-325 MG per tablet Take 1 tablet by mouth 2 (two) times daily.  (Patient taking differently: Take 1 tablet by mouth as needed for pain.)     levocetirizine (XYZAL) 5 MG tablet Take 5 mg by mouth every evening.     meclizine (ANTIVERT) 12.5 MG tablet Take 12.5 mg by mouth 3 (three) times daily as needed for dizziness.     Multiple Vitamin (MULTIVITAMIN WITH MINERALS) TABS Take 1 tablet by mouth every morning.      olmesartan  (BENICAR ) 40 MG tablet Take 40 mg by mouth daily.     prednisoLONE  acetate (PRED FORTE ) 1 % ophthalmic suspension Place 1 drop into both eyes 2 (two) times daily as needed. For eye irritation 5 mL 0   PROAIR  HFA 108 (90 Base) MCG/ACT inhaler Inhale 2 puffs into the lungs every 6 (six) hours as needed for wheezing or shortness of breath.  senna (SENOKOT) 8.6 MG tablet Take 2 tablets by mouth daily.      simvastatin  (ZOCOR ) 40 MG tablet Take 40 mg by mouth daily.     solifenacin (VESICARE) 10 MG tablet Take 10 mg by mouth daily.     sucralfate  (CARAFATE ) 1 G tablet TAKE 1 TABLET (1 GRAM) 4 TIMES A DAY (BETWEEN MEALS AND AT BEDTIME) (Patient taking differently: Take 1 g by mouth 4 (four) times daily -  with meals and at bedtime. ) 120 tablet 1   TRELEGY ELLIPTA 100-62.5-25 MCG/ACT AEPB Take 1 puff by mouth daily.     triamcinolone (NASACORT ALLERGY  24HR) 55 MCG/ACT AERO nasal inhaler Place 2 sprays into the nose daily.     XIIDRA 5 % SOLN Apply 1 drop to eye 2 (two) times daily.     pantoprazole  (PROTONIX ) 40 MG tablet Take 1 tablet (40 mg total) by mouth 2 (two) times daily before a meal. For 6 weeks then back to once daily 60 tablet 1   No current facility-administered medications for this visit.    Allergies as of 07/09/2024 - Review Complete 01/04/2023  Allergen Reaction Noted   Nsaids Other (See Comments) 03/06/2010     Family History  Problem Relation Age of Onset   Diabetes Mother        aunts   Stroke Maternal Grandmother    Heart disease Maternal Aunt    Diabetes Maternal Aunt    Kidney disease Maternal Aunt    Colon cancer Neg Hx    Esophageal cancer Neg Hx    Rectal cancer Neg Hx    Stomach cancer Neg Hx    Lung disease Neg Hx    Rheumatologic disease Neg Hx     Social History[1]   Review of Systems:    Constitutional: No weight loss, fever, chills Cardiovascular: No chest pain Respiratory: No SOB  Gastrointestinal: See HPI and otherwise negative    Physical Exam:  Vital signs: BP (!) 144/80 (BP Location: Left Arm, Patient Position: Sitting, Cuff Size: Normal)   Pulse 92   Ht 5' 3.5 (1.613 m) Comment: height measured without shoes  Wt 182 lb 8 oz (82.8 kg)   BMI 31.82 kg/m   Wt Readings from Last 3 Encounters:  07/09/24 182 lb 8 oz (82.8 kg)  01/04/23 165 lb (74.8 kg)  12/07/22 167 lb (75.8 kg)     Constitutional: Pleasant, well-appearing female in NAD, alert and cooperative Head:  Normocephalic and atraumatic.  Respiratory: Respirations even and unlabored. Lungs clear to auscultation bilaterally.  No wheezes, crackles, or rhonchi.  Cardiovascular:  Regular rate and rhythm. No murmurs. No peripheral edema. Gastrointestinal:  Soft, nondistended, nontender. No rebound or guarding. Normal bowel sounds. No appreciable masses or hepatomegaly. Rectal:  Not performed.  Neurologic:  Alert and oriented x4;  grossly normal neurologically.  Skin:   Dry and intact without significant lesions or rashes. Psychiatric: Oriented to person, place and time. Demonstrates good judgement and reason without abnormal affect or behaviors.   Assessment/Plan:   Assessment & Plan Gastroesophageal reflux disease with dysphagia Patient with chronic GERD, previously maintained on Nexium  40 mg daily.  Was advised by her pharmacist to discontinue Nexium  due to potential interaction with Plavix .   She was off all reflux medications for time and had recurrence of severe heartburn and dysphagia.  Saw PCP last month and was started on Protonix  40 mg daily.  On medication has had improvement in dysphagia though still has symptoms intermittently, not much  improvement in heartburn.  Symptoms particularly bad at night when she lays down.  Has been sleeping on extra pillows to try to alleviate this. Last EGD 2015 with dilation of a mild esophageal stricture.  - Increase pantoprazole  to 40 mg twice daily for 6 weeks, then reduce to once daily - Recommended wedge pillow or head-of-bed elevation. - Reviewed as-needed sucralfate  for discomfort. - Ordered barium swallow study. - Follow-up with Dr. Shila in 6 weeks to discuss potential EGD if symptoms persist or worsen, or if concerning findings on barium swallow    Camie Furbish, PA-C [facility] 07/09/2024, 12:16 PM  Patient Care Team: Shelda Atlas, MD as PCP - General (Internal Medicine)       [1]  Social History Tobacco Use   Smoking status: Former    Current packs/day: 0.00    Average packs/day: 0.1 packs/day for 40.0 years (4.0 ttl pk-yrs)    Types: Cigarettes    Start date: 06/24/1950    Quit date: 06/24/1990    Years since quitting: 34.0   Smokeless tobacco: Never  Vaping Use   Vaping status: Never Used  Substance Use Topics   Alcohol use: No   Drug use: No   "

## 2024-08-04 ENCOUNTER — Other Ambulatory Visit (HOSPITAL_COMMUNITY)

## 2024-08-26 ENCOUNTER — Ambulatory Visit: Admitting: Gastroenterology
